# Patient Record
Sex: Female | Born: 1935 | Race: White | Hispanic: No | Smoking: Never smoker
Health system: Southern US, Community
[De-identification: ages and names within clinical notes are randomized; demographics above are authoritative.]

## PROBLEM LIST (undated history)

## (undated) DIAGNOSIS — I5189 Other ill-defined heart diseases: Secondary | ICD-10-CM

## (undated) DIAGNOSIS — I4821 Permanent atrial fibrillation: Secondary | ICD-10-CM

## (undated) DIAGNOSIS — I10 Essential (primary) hypertension: Secondary | ICD-10-CM

## (undated) DIAGNOSIS — I350 Nonrheumatic aortic (valve) stenosis: Secondary | ICD-10-CM

## (undated) DIAGNOSIS — R29898 Other symptoms and signs involving the musculoskeletal system: Secondary | ICD-10-CM

## (undated) DIAGNOSIS — I2510 Atherosclerotic heart disease of native coronary artery without angina pectoris: Secondary | ICD-10-CM

## (undated) DIAGNOSIS — E039 Hypothyroidism, unspecified: Secondary | ICD-10-CM

## (undated) DIAGNOSIS — E785 Hyperlipidemia, unspecified: Secondary | ICD-10-CM

## (undated) DIAGNOSIS — R0602 Shortness of breath: Secondary | ICD-10-CM

## (undated) HISTORY — PX: CATARACT EXTRACTION: SUR2

## (undated) HISTORY — DX: Essential (primary) hypertension: I10

## (undated) HISTORY — PX: OTHER SURGICAL HISTORY: SHX169

## (undated) HISTORY — DX: Hypothyroidism, unspecified: E03.9

---

## 1998-04-27 ENCOUNTER — Other Ambulatory Visit: Admission: RE | Admit: 1998-04-27 | Discharge: 1998-04-27 | Payer: Self-pay | Admitting: Obstetrics and Gynecology

## 1999-05-11 ENCOUNTER — Encounter: Payer: Self-pay | Admitting: Obstetrics and Gynecology

## 1999-05-11 ENCOUNTER — Encounter: Admission: RE | Admit: 1999-05-11 | Discharge: 1999-05-11 | Payer: Self-pay | Admitting: Obstetrics and Gynecology

## 1999-05-11 ENCOUNTER — Other Ambulatory Visit: Admission: RE | Admit: 1999-05-11 | Discharge: 1999-05-11 | Payer: Self-pay | Admitting: Obstetrics and Gynecology

## 1999-08-12 ENCOUNTER — Encounter: Admission: RE | Admit: 1999-08-12 | Discharge: 1999-08-12 | Payer: Self-pay | Admitting: *Deleted

## 2000-01-06 ENCOUNTER — Encounter: Admission: RE | Admit: 2000-01-06 | Discharge: 2000-01-06 | Payer: Self-pay | Admitting: *Deleted

## 2000-05-11 ENCOUNTER — Encounter: Admission: RE | Admit: 2000-05-11 | Discharge: 2000-05-11 | Payer: Self-pay | Admitting: *Deleted

## 2000-08-21 ENCOUNTER — Other Ambulatory Visit: Admission: RE | Admit: 2000-08-21 | Discharge: 2000-08-21 | Payer: Self-pay | Admitting: *Deleted

## 2001-05-16 ENCOUNTER — Encounter: Admission: RE | Admit: 2001-05-16 | Discharge: 2001-05-16 | Payer: Self-pay | Admitting: *Deleted

## 2001-08-29 ENCOUNTER — Encounter: Admission: RE | Admit: 2001-08-29 | Discharge: 2001-09-27 | Payer: Self-pay | Admitting: *Deleted

## 2002-01-22 ENCOUNTER — Ambulatory Visit (HOSPITAL_COMMUNITY): Admission: RE | Admit: 2002-01-22 | Discharge: 2002-01-22 | Payer: Self-pay | Admitting: Specialist

## 2002-03-17 ENCOUNTER — Ambulatory Visit (HOSPITAL_COMMUNITY): Admission: RE | Admit: 2002-03-17 | Discharge: 2002-03-17 | Payer: Self-pay | Admitting: Specialist

## 2002-05-05 ENCOUNTER — Encounter: Payer: Self-pay | Admitting: Internal Medicine

## 2002-05-05 ENCOUNTER — Encounter: Admission: RE | Admit: 2002-05-05 | Discharge: 2002-05-05 | Payer: Self-pay | Admitting: Internal Medicine

## 2003-05-11 ENCOUNTER — Encounter: Admission: RE | Admit: 2003-05-11 | Discharge: 2003-05-11 | Payer: Self-pay | Admitting: Internal Medicine

## 2004-02-01 ENCOUNTER — Encounter: Admission: RE | Admit: 2004-02-01 | Discharge: 2004-02-01 | Payer: Self-pay | Admitting: Internal Medicine

## 2004-05-11 ENCOUNTER — Encounter: Admission: RE | Admit: 2004-05-11 | Discharge: 2004-05-11 | Payer: Self-pay | Admitting: Internal Medicine

## 2005-05-11 ENCOUNTER — Encounter: Admission: RE | Admit: 2005-05-11 | Discharge: 2005-05-11 | Payer: Self-pay | Admitting: Internal Medicine

## 2006-05-17 ENCOUNTER — Encounter: Admission: RE | Admit: 2006-05-17 | Discharge: 2006-05-17 | Payer: Self-pay | Admitting: Internal Medicine

## 2007-03-12 ENCOUNTER — Encounter: Admission: RE | Admit: 2007-03-12 | Discharge: 2007-03-12 | Payer: Self-pay | Admitting: Internal Medicine

## 2007-05-09 ENCOUNTER — Ambulatory Visit (HOSPITAL_COMMUNITY): Admission: RE | Admit: 2007-05-09 | Discharge: 2007-05-09 | Payer: Self-pay | Admitting: General Surgery

## 2007-05-09 ENCOUNTER — Encounter (INDEPENDENT_AMBULATORY_CARE_PROVIDER_SITE_OTHER): Payer: Self-pay | Admitting: General Surgery

## 2007-08-08 ENCOUNTER — Encounter: Admission: RE | Admit: 2007-08-08 | Discharge: 2007-08-08 | Payer: Self-pay | Admitting: General Surgery

## 2007-09-19 ENCOUNTER — Encounter: Admission: RE | Admit: 2007-09-19 | Discharge: 2007-09-19 | Payer: Self-pay | Admitting: General Surgery

## 2007-09-25 ENCOUNTER — Encounter: Admission: RE | Admit: 2007-09-25 | Discharge: 2007-09-25 | Payer: Self-pay | Admitting: General Surgery

## 2007-09-27 ENCOUNTER — Encounter (INDEPENDENT_AMBULATORY_CARE_PROVIDER_SITE_OTHER): Payer: Self-pay | Admitting: Diagnostic Radiology

## 2007-09-27 ENCOUNTER — Encounter: Admission: RE | Admit: 2007-09-27 | Discharge: 2007-09-27 | Payer: Self-pay | Admitting: General Surgery

## 2008-03-16 ENCOUNTER — Encounter: Admission: RE | Admit: 2008-03-16 | Discharge: 2008-03-16 | Payer: Self-pay | Admitting: General Surgery

## 2010-01-04 ENCOUNTER — Ambulatory Visit: Payer: Self-pay | Admitting: Cardiology

## 2010-01-11 ENCOUNTER — Encounter: Payer: Self-pay | Admitting: Cardiology

## 2010-01-11 ENCOUNTER — Ambulatory Visit (HOSPITAL_COMMUNITY): Admission: RE | Admit: 2010-01-11 | Discharge: 2010-01-11 | Payer: Self-pay | Admitting: Cardiology

## 2010-03-30 ENCOUNTER — Emergency Department (HOSPITAL_COMMUNITY): Admission: EM | Admit: 2010-03-30 | Discharge: 2010-03-31 | Payer: Self-pay | Admitting: Emergency Medicine

## 2010-06-26 ENCOUNTER — Encounter: Payer: Self-pay | Admitting: Internal Medicine

## 2010-06-27 ENCOUNTER — Encounter: Payer: Self-pay | Admitting: Internal Medicine

## 2010-10-18 NOTE — Op Note (Signed)
Jacqueline [name], Jacqueline [name]                 ACCOUNT NO.:  [id number]   MEDICAL RECORD NO.:  [id number]          PATIENT TYPE:  AMB   LOCATION:  SDS                          FACILITY:  [facility]   PHYSICIAN:  Ollen Gross. Vernell Morgans, M.D. DATE OF BIRTH:  December 20, 1916   DATE OF PROCEDURE:  05/09/2007  DATE OF DISCHARGE:                               OPERATIVE REPORT   PREOPERATIVE DIAGNOSIS:  Left nipple ulceration.   POSTOPERATIVE DIAGNOSIS:  Left nipple ulceration.   PROCEDURE:  Left nipple biopsy.   SURGEON:  Ollen Gross. Vernell Morgans, M.D.   ANESTHESIA:  General via LMA.   PROCEDURE:  After informed consent was obtained, the patient was brought  to the operating and placed in supine position on the operating room  table.  After adequate induction of general anesthesia, the patient's  left breast was prepped with Betadine and draped in usual sterile  manner.  On the lateral aspect of the nipple the patient had two small  ulcerations.  The inferior ulceration was excised in elliptical fashion.  This was sent to pathology for further evaluation.  The wound was then  infiltrated with 0.25% Marcaine.  The incision was then closed with two  interrupted 5-0 Prolene stitches.  Antibiotic ointment, sterile  dressings were applied.  The patient tolerated well.  At the end of the  case all needle, sponge and instrument counts were correct.  The patient  was awakened and taken recovery in stable condition.      Ollen Gross. Vernell Morgans, M.D.  Electronically Signed     PST/MEDQ  D:  05/09/2007  T:  05/09/2007  Job:  [id number]

## 2011-01-30 ENCOUNTER — Other Ambulatory Visit: Payer: Self-pay | Admitting: *Deleted

## 2011-01-30 MED ORDER — OLMESARTAN MEDOXOMIL 40 MG PO TABS: 40.0000 mg | ORAL_TABLET | Freq: Every day | ORAL | Status: DC

## 2011-01-30 NOTE — Telephone Encounter (Signed)
escribe medication per fax request  

## 2011-03-13 LAB — BASIC METABOLIC PANEL
BUN: 18
CO2: 29
Calcium: 9.5
Chloride: 99
Creatinine, Ser: 0.75
GFR calc Af Amer: >60
GFR calc non Af Amer: >60
Glucose, Bld: 126 — ABNORMAL HIGH
Potassium: 3.8
Sodium: 139

## 2011-03-13 LAB — CBC
HCT: 40.1
Hemoglobin: 13.9
MCHC: 34.5
MCV: 88.8
Platelets: 196
RBC: 4.52
RDW: 15.1
WBC: 8.7

## 2011-12-29 ENCOUNTER — Emergency Department (HOSPITAL_COMMUNITY): Payer: Medicare Other

## 2011-12-29 ENCOUNTER — Emergency Department (HOSPITAL_COMMUNITY)
Admission: EM | Admit: 2011-12-29 | Discharge: 2011-12-30 | Disposition: A | Discharge status: Home / Self Care | Payer: Medicare Other | Attending: Emergency Medicine | Admitting: Emergency Medicine

## 2011-12-29 ENCOUNTER — Telehealth: Payer: Self-pay | Admitting: Cardiology

## 2011-12-29 ENCOUNTER — Encounter (HOSPITAL_COMMUNITY): Payer: Self-pay | Admitting: Emergency Medicine

## 2011-12-29 DIAGNOSIS — R7401 Elevation of levels of liver transaminase levels: Secondary | ICD-10-CM | POA: Insufficient documentation

## 2011-12-29 DIAGNOSIS — R7402 Elevation of levels of lactic acid dehydrogenase (LDH): Secondary | ICD-10-CM | POA: Insufficient documentation

## 2011-12-29 DIAGNOSIS — E039 Hypothyroidism, unspecified: Secondary | ICD-10-CM | POA: Insufficient documentation

## 2011-12-29 DIAGNOSIS — E119 Type 2 diabetes mellitus without complications: Secondary | ICD-10-CM | POA: Insufficient documentation

## 2011-12-29 DIAGNOSIS — I10 Essential (primary) hypertension: Secondary | ICD-10-CM | POA: Insufficient documentation

## 2011-12-29 DIAGNOSIS — R109 Unspecified abdominal pain: Secondary | ICD-10-CM | POA: Insufficient documentation

## 2011-12-29 DIAGNOSIS — D72829 Elevated white blood cell count, unspecified: Secondary | ICD-10-CM | POA: Insufficient documentation

## 2011-12-29 DIAGNOSIS — M109 Gout, unspecified: Secondary | ICD-10-CM | POA: Insufficient documentation

## 2011-12-29 LAB — CBC WITH DIFFERENTIAL/PLATELET
Eosinophils Absolute: 0 10*3/uL (ref 0.0–0.7)
HCT: 45.4 % (ref 36.0–46.0)
Hemoglobin: 15.2 g/dL — ABNORMAL HIGH (ref 12.0–15.0)
Lymphs Abs: 0.4 10*3/uL — ABNORMAL LOW (ref 0.7–4.0)
MCH: 29.5 pg (ref 26.0–34.0)
Monocytes Relative: 2 % — ABNORMAL LOW (ref 3–12)
Neutro Abs: 10.8 10*3/uL — ABNORMAL HIGH (ref 1.7–7.7)
Neutrophils Relative %: 94 % — ABNORMAL HIGH (ref 43–77)
RBC: 5.15 MIL/uL — ABNORMAL HIGH (ref 3.87–5.11)

## 2011-12-29 LAB — COMPREHENSIVE METABOLIC PANEL
Alkaline Phosphatase: 206 U/L — ABNORMAL HIGH (ref 39–117)
BUN: 18 mg/dL (ref 6–23)
Chloride: 102 mEq/L (ref 96–112)
GFR calc Af Amer: 85 mL/min — ABNORMAL LOW (ref >90)
Glucose, Bld: 114 mg/dL — ABNORMAL HIGH (ref 70–99)
Potassium: 3.4 mEq/L — ABNORMAL LOW (ref 3.5–5.1)
Total Bilirubin: 1.9 mg/dL — ABNORMAL HIGH (ref 0.3–1.2)

## 2011-12-29 MED ORDER — ONDANSETRON HCL 4 MG/2ML IJ SOLN
INTRAMUSCULAR | Status: AC
  Administered 2011-12-29: 4 mg
  Filled 2011-12-29: qty 2

## 2011-12-29 NOTE — ED Notes (Addendum)
Pt began having epigastric pain at 830 this morning that resolved after belching with L arm pain. AT 430p she began having diffuse lower abdominal pain. Tender to palpation. Has vomited approx. 6times now which is to be described as dark and grainy. CBG 105. Hypertensive initially. 230/118. Assisted living facility- [facility]. 4mg  Zofran. 20g R hand.

## 2011-12-29 NOTE — Telephone Encounter (Signed)
Patient called no answer.Left message to call needs to schedule appointment with Dr.Jordan. 

## 2011-12-29 NOTE — Telephone Encounter (Signed)
Pt had chest discomfort this morning and she lives in a [facility] and they told her to take an asa and she wants to talk to you about it

## 2011-12-29 NOTE — Telephone Encounter (Signed)
Spoke with pt, she woke this am with discomfort in her left shoulder and some tightness in her chest. She reports she has not felt well in several days but attributes that to several things. The pain in the left shoulder increases with movement. She took an aspirin and she no longer has the tightness in her chest. She reports her stomach was upset yesterday but felt better once she had a BM. She reports burping this am made the chest discomfort better. Explained to pt it does not sound like heart pain. She will call back if she has more problems but does not want to come in at this time.

## 2011-12-29 NOTE — ED Provider Notes (Addendum)
History     CSN: [id number]  Arrival date & time 12/29/11  2021   First MD Initiated Contact with Patient 12/29/11 2247      Chief Complaint  Patient presents with  . Emesis  . Abdominal Pain    (Consider location/radiation/quality/duration/timing/severity/associated sxs/prior treatment) Patient is a 78 y.o. female presenting with vomiting and abdominal pain. The history is provided by the patient and a relative.  Emesis  Associated symptoms include abdominal pain. Pertinent negatives include no cough, no diarrhea, no fever and no headaches.  Abdominal Pain The primary symptoms of the illness include abdominal pain, nausea and vomiting. The primary symptoms of the illness do not include fever, shortness of breath, diarrhea or dysuria.  pt had an episode of abdominal and chest "tightness" this am. Her sxs resolved after she burped. Later she had n/v.  Now she is asx. She denies cough, sob, diarrhea or uti sxs.  She denies pain or tightness now. She denies hx of ami or chf.  She denies hx of abd surgery in the past.  She denies smoking or etoh use.    Past Medical History  Diagnosis Date  . Hypertension   . Aortic stenosis   . Diabetes mellitus   . Dizziness   . Gout   . Hypothyroidism   . Undiagnosed cardiac murmurs     Past Surgical History  Procedure Date  . Left knee replacement   . Breast biopsies   . Cataract extraction     Family History  Problem Relation Age of Onset  . Alzheimer's disease Brother     History  Substance Use Topics  . Smoking status: Never Smoker   . Smokeless tobacco: Not on file  . Alcohol Use: No    OB History    Grav Para Term Preterm Abortions TAB SAB Ect Mult Living                  Review of Systems  Constitutional: Negative for fever.  HENT: Negative for congestion.   Eyes: Negative for visual disturbance.  Respiratory: Positive for chest tightness. Negative for cough and shortness of breath.   Gastrointestinal: Positive  for nausea, vomiting and abdominal pain. Negative for diarrhea.  Genitourinary: Negative for dysuria.  Skin: Negative for rash.  Neurological: Negative for headaches.  Hematological: Does not bruise/bleed easily.  Psychiatric/Behavioral: Negative for confusion.  All other systems reviewed and are negative.    Allergies  Review of patient's allergies indicates no known allergies.  Home Medications   Current Outpatient Rx  Name Route Sig Dispense Refill  . ATENOLOL 50 MG PO TABS Oral Take 50 mg by mouth at bedtime.     . FUROSEMIDE 40 MG PO TABS Oral Take 40 mg by mouth daily.    Marland Kitchen LEVOTHYROXINE SODIUM 125 MCG PO TABS Oral Take 125 mcg by mouth daily.    Marland Kitchen OLMESARTAN MEDOXOMIL 40 MG PO TABS Oral Take 1 tablet (40 mg total) by mouth daily. 30 tablet 0    This patient has not been since in over a year. Wi ...  . POTASSIUM CHLORIDE CRYS ER 20 MEQ PO TBCR Oral Take 20 mEq by mouth daily.      BP 173/62  Pulse 93  Temp 99.8 F (37.7 C) (Oral)  Resp 22  SpO2 91%  Physical Exam  Nursing note and vitals reviewed. Constitutional: She is oriented to person, place, and time. She appears well-developed and well-nourished. No distress.  HENT:  Head: Normocephalic and  atraumatic.  Eyes: Conjunctivae are normal.  Neck: Normal range of motion. Neck supple. No JVD present.       No carotid bruit  Cardiovascular: Normal rate.   Murmur heard. Pulmonary/Chest: Effort normal. No respiratory distress. She has no rales.  Abdominal: Soft. She exhibits no distension. There is tenderness. There is no rebound and no guarding.       Mild epigastric tenderness, with no peritoneal signs  Musculoskeletal: Normal range of motion.  Neurological: She is alert and oriented to person, place, and time.  Skin: Skin is warm and dry.  Psychiatric: She has a normal mood and affect. Thought content normal.    ED Course  Procedures (including critical care time) abdominal and chest tightness in the morning,  which resolved after burping.  She is asymptomatic now with only very mild epigastric tenderness.  We'll perform laboratory testing, and a chest x-ray, since she is mildly hypoxic.  There is no indication for medical treatment at this time.  Since she is asymptomatic  Labs Reviewed  CBC WITH DIFFERENTIAL - Abnormal; Notable for the following:    WBC 11.5 (*)     RBC 5.15 (*)     Hemoglobin 15.2 (*)     Platelets 143 (*)     Neutrophils Relative 94 (*)     Neutro Abs 10.8 (*)     Lymphocytes Relative 3 (*)     Lymphs Abs 0.4 (*)     Monocytes Relative 2 (*)     All other components within normal limits  COMPREHENSIVE METABOLIC PANEL - Abnormal; Notable for the following:    Potassium 3.4 (*)     Glucose, Bld 114 (*)     AST 155 (*)     ALT 81 (*)     Alkaline Phosphatase 206 (*)     Total Bilirubin 1.9 (*)     GFR calc non Af Amer 73 (*)     GFR calc Af Amer 85 (*)     All other components within normal limits  POCT I-STAT TROPONIN I  URINALYSIS, ROUTINE W REFLEX MICROSCOPIC   No results found.   No diagnosis found.  Abdominal pain that occurred in the am. Resolved.   Only mild epigastric ttp. No acute abdomen.   Has increased transaminases  But afebrile. Only mild incr. Wbcs and no dm.  Do not think [place] indicated now in ed.   ECG. Normal sinus rhythm at 95 beats per minute. First degree AV block. LVH. Q waves in inferior leads  MDM  Abdominal pain. Leukocytosis. Increased transaminases.         Cheri Guppy, MD 12/30/11 1610  Cheri Guppy, MD 12/30/11 [id number]  Cheri Guppy, MD 01/23/12 [phone]

## 2011-12-29 NOTE — ED Notes (Signed)
WJX:BJYN<WG> Expected date:<BR> Expected time:<BR> Means of arrival:<BR> Comments:<BR> EMS- 89yof nausea, chest pain this am

## 2011-12-29 NOTE — ED Notes (Addendum)
Chelesea Weiand (son)  Home) [phone] Cell) [phone]

## 2011-12-30 LAB — URINALYSIS, ROUTINE W REFLEX MICROSCOPIC
Ketones, ur: NEGATIVE mg/dL
Leukocytes, UA: NEGATIVE
Nitrite: NEGATIVE
Protein, ur: NEGATIVE mg/dL

## 2011-12-30 MED ORDER — METOCLOPRAMIDE HCL 10 MG PO TABS: 10.0000 mg | ORAL_TABLET | Freq: Four times a day (QID) | ORAL | Status: DC

## 2012-01-02 NOTE — Telephone Encounter (Signed)
Patient called stated she had to go to ER 12/29/11,was told she had a stomach virus.Appointment scheduled with Dr.Jordan 01/30/12.

## 2012-01-30 ENCOUNTER — Ambulatory Visit: Payer: Medicare Other | Admitting: Cardiology

## 2012-03-06 ENCOUNTER — Ambulatory Visit (INDEPENDENT_AMBULATORY_CARE_PROVIDER_SITE_OTHER): Payer: Medicare Other | Admitting: Cardiology

## 2012-03-06 ENCOUNTER — Encounter: Payer: Self-pay | Admitting: Cardiology

## 2012-03-06 VITALS — BP 168/86 | HR 72 | Ht 64.0 in | Wt 189.8 lb

## 2012-03-06 DIAGNOSIS — I10 Essential (primary) hypertension: Secondary | ICD-10-CM | POA: Insufficient documentation

## 2012-03-06 DIAGNOSIS — I359 Nonrheumatic aortic valve disorder, unspecified: Secondary | ICD-10-CM

## 2012-03-06 DIAGNOSIS — I350 Nonrheumatic aortic (valve) stenosis: Secondary | ICD-10-CM

## 2012-03-06 DIAGNOSIS — R0789 Other chest pain: Secondary | ICD-10-CM

## 2012-03-06 NOTE — Progress Notes (Signed)
Jacqueline [name] Date of Birth: July 22, 1916 Medical Record #[id number]  History of Present Illness: Jacqueline [name] is seen today for evaluation of chest pain. She was last seen 2 years ago. She has a history of a murmur with prior echocardiogram showed mild aortic stenosis. She has a history of hypertension. In July of this year she states that she awoke with complaints of chest pain. She states it felt very tight in her chest and radiated to her left arm. She then began belching and had some emesis. She presented to the emergency department. ECG at that time showed LVH with repolarization abnormality. Cardiac enzymes were negative. Her liver function studies were elevated. She was discharged to home. She has had no recurrent chest pain since that time. She reports that she has had followup lab work since then but does not know the results.  Current Outpatient Prescriptions on File Prior to Visit  Medication Sig Dispense Refill  . atenolol (TENORMIN) 50 MG tablet Take 50 mg by mouth at bedtime.       . furosemide (LASIX) 40 MG tablet Take 40 mg by mouth daily.      Marland Kitchen levothyroxine (SYNTHROID, LEVOTHROID) 125 MCG tablet Take 125 mcg by mouth daily.      . potassium chloride SA (K-DUR,KLOR-CON) 20 MEQ tablet Take 20 mEq by mouth daily.      Marland Kitchen olmesartan (BENICAR) 40 MG tablet Take 1 tablet (40 mg total) by mouth daily.  30 tablet  0    No Known Allergies  Past Medical History  Diagnosis Date  . Hypertension   . Aortic stenosis     mild  . Diabetes mellitus   . Dizziness   . Gout   . Hypothyroidism     Past Surgical History  Procedure Date  . Left knee replacement   . Breast biopsies   . Cataract extraction     History  Smoking status  . Never Smoker   Smokeless tobacco  . Not on file    History  Alcohol Use No    Family History  Problem Relation Age of Onset  . Alzheimer's disease Brother     Review of Systems: The review of systems is positive for chronic edema for which  she wears a support hose. She suffered a fall 2 years ago and has been living at [facility]. She does get regular physical therapy there.  All other systems were reviewed and are negative.  Physical Exam: BP 168/86  Pulse 72  Ht 5\' 4"  (1.626 m)  Wt 189 lb 12.8 oz (86.093 kg)  BMI 32.58 kg/m2  SpO2 95% She is an obese, elderly white female in no acute distress. Her HEENT exam is unremarkable. Pupils are equal round and reactive. Sclera clear. Oropharynx is clear. Neck is without JVD or bruits. There is no adenopathy. Lungs are clear. Cardiac exam reveals a regular rate and rhythm with a grade 2/6 harsh systolic murmur in the right upper sternal border. Abdomen is obese, soft, nontender. There are no masses. Extremities reveal chronic 2+ edema. She is wearing support hose. There is no evidence of phlebitis. Skin is warm and dry. Neurologic exam she is alert and oriented x3. She does walk with a cane. LABORATORY DATA: Laboratory data was reviewed from the emergency department as noted.  Assessment / Plan: 1. Atypical chest pain. This occurred in July of this year. She has had no recurrence. I do not feel this was a cardiac event. Given her elevated  liver function studies I suspect she may have passed gallstone. I do not feel that any further evaluation is warranted. She will followup with her primary care at this point and I will see her as needed.  2. Hypertension.  3. Mild aortic stenosis.

## 2012-03-06 NOTE — Patient Instructions (Signed)
Continue your current therapy  I will see you back as needed.   

## 2012-07-30 ENCOUNTER — Encounter (HOSPITAL_COMMUNITY): Payer: Self-pay | Admitting: Emergency Medicine

## 2012-07-30 ENCOUNTER — Inpatient Hospital Stay (HOSPITAL_COMMUNITY)
Admission: EM | Admit: 2012-07-30 | Discharge: 2012-08-06 | DRG: 247 | Disposition: A | Discharge status: Nursing Home (Includes ICF) | Payer: Medicare Other | Attending: Cardiology | Admitting: Cardiology

## 2012-07-30 DIAGNOSIS — I2510 Atherosclerotic heart disease of native coronary artery without angina pectoris: Principal | ICD-10-CM | POA: Diagnosis present

## 2012-07-30 DIAGNOSIS — R5381 Other malaise: Secondary | ICD-10-CM | POA: Diagnosis present

## 2012-07-30 DIAGNOSIS — E039 Hypothyroidism, unspecified: Secondary | ICD-10-CM | POA: Diagnosis present

## 2012-07-30 DIAGNOSIS — Z96659 Presence of unspecified artificial knee joint: Secondary | ICD-10-CM

## 2012-07-30 DIAGNOSIS — I10 Essential (primary) hypertension: Secondary | ICD-10-CM | POA: Diagnosis present

## 2012-07-30 DIAGNOSIS — M109 Gout, unspecified: Secondary | ICD-10-CM | POA: Diagnosis present

## 2012-07-30 DIAGNOSIS — Z7982 Long term (current) use of aspirin: Secondary | ICD-10-CM

## 2012-07-30 DIAGNOSIS — E119 Type 2 diabetes mellitus without complications: Secondary | ICD-10-CM | POA: Diagnosis present

## 2012-07-30 DIAGNOSIS — I200 Unstable angina: Secondary | ICD-10-CM | POA: Diagnosis present

## 2012-07-30 DIAGNOSIS — E785 Hyperlipidemia, unspecified: Secondary | ICD-10-CM | POA: Diagnosis present

## 2012-07-30 DIAGNOSIS — I359 Nonrheumatic aortic valve disorder, unspecified: Secondary | ICD-10-CM | POA: Diagnosis present

## 2012-07-30 DIAGNOSIS — I350 Nonrheumatic aortic (valve) stenosis: Secondary | ICD-10-CM

## 2012-07-30 HISTORY — DX: Atherosclerotic heart disease of native coronary artery without angina pectoris: I25.10

## 2012-07-30 HISTORY — DX: Nonrheumatic aortic (valve) stenosis: I35.0

## 2012-07-30 NOTE — ED Notes (Signed)
Pt brought in by EMS from [facility] for complaints of Chest pain. Pt states she felt a constant throbbing sensation on the left side of her chest. Pt denies SOB/ N/V. Pt denies pain at this time. EMS states b/p 218/108. Pt alert and oriented. No signs of distress. Pt states she EMS gave her 1 ASA and 2 Nitro tabs. Pt arrived on 3L [place] o2

## 2012-07-30 NOTE — ED Notes (Signed)
MD at bedside. 

## 2012-07-31 ENCOUNTER — Encounter (HOSPITAL_COMMUNITY): Payer: Self-pay | Admitting: Cardiology

## 2012-07-31 ENCOUNTER — Emergency Department (HOSPITAL_COMMUNITY): Payer: Medicare Other

## 2012-07-31 DIAGNOSIS — R079 Chest pain, unspecified: Secondary | ICD-10-CM

## 2012-07-31 LAB — HEMOGLOBIN A1C: Mean Plasma Glucose: 123 mg/dL — ABNORMAL HIGH (ref <117)

## 2012-07-31 LAB — CBC
Hemoglobin: 14.2 g/dL (ref 12.0–15.0)
MCH: 30.1 pg (ref 26.0–34.0)
Platelets: 144 10*3/uL — ABNORMAL LOW (ref 150–400)
RBC: 4.72 MIL/uL (ref 3.87–5.11)
WBC: 6.3 10*3/uL (ref 4.0–10.5)

## 2012-07-31 LAB — BASIC METABOLIC PANEL
BUN: 21 mg/dL (ref 6–23)
CO2: 24 mEq/L (ref 19–32)
Calcium: 9.1 mg/dL (ref 8.4–10.5)
GFR calc Af Amer: 90 mL/min — ABNORMAL LOW (ref >90)
GFR calc non Af Amer: 76 mL/min — ABNORMAL LOW (ref >90)
GFR calc non Af Amer: 77 mL/min — ABNORMAL LOW (ref >90)
Glucose, Bld: 111 mg/dL — ABNORMAL HIGH (ref 70–99)
Glucose, Bld: 99 mg/dL (ref 70–99)
Potassium: 3.1 mEq/L — ABNORMAL LOW (ref 3.5–5.1)
Potassium: 3.3 mEq/L — ABNORMAL LOW (ref 3.5–5.1)
Sodium: 141 mEq/L (ref 135–145)

## 2012-07-31 LAB — HEPARIN LEVEL (UNFRACTIONATED): Heparin Unfractionated: 0.18 IU/mL — ABNORMAL LOW (ref 0.30–0.70)

## 2012-07-31 LAB — PRO B NATRIURETIC PEPTIDE: Pro B Natriuretic peptide (BNP): 1371 pg/mL — ABNORMAL HIGH (ref 0–450)

## 2012-07-31 LAB — CBC WITH DIFFERENTIAL/PLATELET
Eosinophils Absolute: 0.1 10*3/uL (ref 0.0–0.7)
Eosinophils Relative: 2 % (ref 0–5)
Hemoglobin: 14.1 g/dL (ref 12.0–15.0)
Lymphs Abs: 1.2 10*3/uL (ref 0.7–4.0)
MCH: 30.1 pg (ref 26.0–34.0)
MCV: 88.5 fL (ref 78.0–100.0)
Monocytes Relative: 7 % (ref 3–12)
Neutrophils Relative %: 77 % (ref 43–77)
RBC: 4.69 MIL/uL (ref 3.87–5.11)

## 2012-07-31 MED ORDER — HEPARIN BOLUS VIA INFUSION
2000.0000 [IU] | Freq: Once | INTRAVENOUS | Status: AC
  Administered 2012-07-31: 2000 [IU] via INTRAVENOUS
  Filled 2012-07-31: qty 2000

## 2012-07-31 MED ORDER — IRBESARTAN 300 MG PO TABS
300.0000 mg | ORAL_TABLET | Freq: Every day | ORAL | Status: DC
  Administered 2012-07-31 – 2012-08-06 (×7): 300 mg via ORAL
  Filled 2012-07-31 (×7): qty 1

## 2012-07-31 MED ORDER — SODIUM CHLORIDE 0.9 % IJ SOLN
3.0000 mL | Freq: Two times a day (BID) | INTRAMUSCULAR | Status: DC
  Administered 2012-07-31 – 2012-08-02 (×2): 3 mL via INTRAVENOUS

## 2012-07-31 MED ORDER — MORPHINE SULFATE 2 MG/ML IJ SOLN
2.0000 mg | INTRAMUSCULAR | Status: DC | PRN
  Administered 2012-08-02: 2 mg via INTRAVENOUS
  Filled 2012-07-31: qty 1

## 2012-07-31 MED ORDER — AMLODIPINE BESYLATE 5 MG PO TABS
5.0000 mg | ORAL_TABLET | Freq: Every day | ORAL | Status: DC
  Administered 2012-07-31: 5 mg via ORAL
  Filled 2012-07-31 (×2): qty 1

## 2012-07-31 MED ORDER — ATORVASTATIN CALCIUM 20 MG PO TABS
20.0000 mg | ORAL_TABLET | Freq: Every day | ORAL | Status: DC
  Administered 2012-07-31 – 2012-08-05 (×6): 20 mg via ORAL
  Filled 2012-07-31 (×7): qty 1

## 2012-07-31 MED ORDER — SODIUM CHLORIDE 0.9 % IV SOLN: 250.0000 mL | INTRAVENOUS | Status: DC | PRN

## 2012-07-31 MED ORDER — ONDANSETRON HCL 4 MG PO TABS: 4.0000 mg | ORAL_TABLET | Freq: Four times a day (QID) | ORAL | Status: DC | PRN

## 2012-07-31 MED ORDER — NITROGLYCERIN 0.4 MG SL SUBL
0.4000 mg | SUBLINGUAL_TABLET | SUBLINGUAL | Status: DC | PRN
Start: 2012-07-31 — End: 2012-08-06

## 2012-07-31 MED ORDER — SODIUM CHLORIDE 0.9 % IJ SOLN
3.0000 mL | Freq: Two times a day (BID) | INTRAMUSCULAR | Status: DC
  Administered 2012-07-31 – 2012-08-01 (×3): 3 mL via INTRAVENOUS

## 2012-07-31 MED ORDER — ASPIRIN 325 MG PO TABS
325.0000 mg | ORAL_TABLET | Freq: Every day | ORAL | Status: DC
  Administered 2012-07-31 – 2012-08-05 (×6): 325 mg via ORAL
  Filled 2012-07-31 (×8): qty 1

## 2012-07-31 MED ORDER — HEPARIN (PORCINE) IN NACL 100-0.45 UNIT/ML-% IJ SOLN
1000.0000 [IU]/h | INTRAMUSCULAR | Status: DC
  Administered 2012-07-31: 750 [IU]/h via INTRAVENOUS
  Filled 2012-07-31: qty 250

## 2012-07-31 MED ORDER — LEVOTHYROXINE SODIUM 125 MCG PO TABS
125.0000 ug | ORAL_TABLET | Freq: Every day | ORAL | Status: DC
  Administered 2012-07-31 – 2012-08-06 (×7): 125 ug via ORAL
  Filled 2012-07-31 (×7): qty 1

## 2012-07-31 MED ORDER — FUROSEMIDE 40 MG PO TABS
40.0000 mg | ORAL_TABLET | Freq: Every day | ORAL | Status: DC | PRN
  Filled 2012-07-31: qty 1

## 2012-07-31 MED ORDER — ATENOLOL 50 MG PO TABS
50.0000 mg | ORAL_TABLET | Freq: Every day | ORAL | Status: DC
  Administered 2012-07-31: 50 mg via ORAL
  Filled 2012-07-31 (×2): qty 1

## 2012-07-31 MED ORDER — ONDANSETRON HCL 4 MG/2ML IJ SOLN: 4.0000 mg | Freq: Four times a day (QID) | INTRAMUSCULAR | Status: DC | PRN

## 2012-07-31 MED ORDER — SODIUM CHLORIDE 0.9 % IJ SOLN: 3.0000 mL | INTRAMUSCULAR | Status: DC | PRN

## 2012-07-31 MED ORDER — POTASSIUM CHLORIDE CRYS ER 20 MEQ PO TBCR
20.0000 meq | EXTENDED_RELEASE_TABLET | Freq: Every day | ORAL | Status: DC
  Administered 2012-07-31 – 2012-08-05 (×6): 20 meq via ORAL
  Filled 2012-07-31 (×7): qty 1

## 2012-07-31 NOTE — Progress Notes (Signed)
UR Completed Jasamine Pottinger Graves-Bigelow, RN,BSN [phone]  

## 2012-07-31 NOTE — Consult Note (Signed)
CARDIOLOGY CONSULT NOTE  Patient ID: Jacqueline [name] MRN: [id number] DOB/AGE: 1917-04-15 79 y.o.  Admit date: 07/30/2012 Primary Physician   Warrick Parisian, MD Primary Cardiologist  Dr. Creta Levin, MD Chief Complaint   Chest pain  HPI:  The patient presents with chest pain.  She lives at [facility].  She developed chest pain on 2/25 and called EMS.  Per the ER notes her BP was elevated (218/108).  She was treated with NGT SL and ASA.  She has a history of this chest pain in the past thought to be non anginal.  She sees Dr. [place] for follow up of this and mild AS.  Troponin has been negative x 3.  EKG shows no acute changes.  Pro BNP is mildly elevated (1371).    The patient reports that she's had some chest discomfort and predominantly left arm discomfort that she gets when she is anxious or exerting she thinks this is coming on with less exertion than previous. Discomfort is about 4/10.   she does not describe associated symptoms. She presented to the emergency room because the pain happened at rest. She did think that it improves after being given nitroglycerin and aspirin. Typically he goes away when she combs down. She does also note that her blood pressure goes up when she is emotionally stressed. She gets around with a walker, cane and a scooter. She's had no new shortness of breath, PND or orthopnea. She's had no palpitations, presyncope or syncope. She's had no weight gain she does have some chronic edema.   Past Medical History  Diagnosis Date  . Hypertension   . Aortic stenosis     mild  . Diabetes mellitus   . Dizziness   . Gout   . Hypothyroidism     Past Surgical History  Procedure Laterality Date  . Left knee replacement    . Breast biopsies    . Cataract extraction      No Known Allergies Prescriptions prior to admission  Medication Sig Dispense Refill  . aspirin 325 MG tablet Take 325 mg by mouth daily.      Marland Kitchen atenolol (TENORMIN) 50 MG tablet Take 50 mg by  mouth at bedtime.       . furosemide (LASIX) 40 MG tablet Take 40 mg by mouth daily as needed (for swelling).       Marland Kitchen levothyroxine (SYNTHROID, LEVOTHROID) 125 MCG tablet Take 125 mcg by mouth daily.      Marland Kitchen olmesartan (BENICAR) 40 MG tablet Take 40 mg by mouth daily.      . potassium chloride SA (K-DUR,KLOR-CON) 20 MEQ tablet Take 20 mEq by mouth daily.      Marland Kitchen olmesartan (BENICAR) 40 MG tablet Take 1 tablet (40 mg total) by mouth daily.  30 tablet  0   Family History  Problem Relation Age of Onset  . Alzheimer's disease Brother     History   Social History  . Marital Status: Widowed    Spouse Name: N/A    Number of Children: 2  . Years of Education: N/A   Occupational History  . Not on file.   Social History Main Topics  . Smoking status: Never Smoker   . Smokeless tobacco: Not on file  . Alcohol Use: No  . Drug Use: No  . Sexually Active:    Other Topics Concern  . Not on file   Social History Narrative  . No narrative on file     ROS:  Positive  for constipation. Otherwise as stated in the HPI and negative for all other systems.  Physical Exam: Blood pressure 175/97, pulse 73, temperature 97.7 F (36.5 C), temperature source Axillary, resp. rate 20, height 5\' 4"  (1.626 m), weight 191 lb (86.637 kg), SpO2 97.00%.  GENERAL:  Well appearing, looks much younger than stated age HEENT:  Pupils equal round and reactive, fundi not visualized, oral mucosa unremarkable NECK:  No jugular venous distention, waveform within normal limits, carotid upstroke brisk and symmetric, no bruits, no thyromegaly LYMPHATICS:  No cervical, inguinal adenopathy LUNGS:  Clear to auscultation bilaterally BACK:  No CVA tenderness CHEST:  Unremarkable HEART:  PMI not displaced or sustained,S1 and S2 within normal limits, no S3, no S4, no clicks, no rubs, apical early peaking systolic murmur, no diastolic murmurs ABD:  Flat, positive bowel sounds normal in frequency in pitch, no bruits, no rebound,  no guarding, no midline pulsatile mass, no hepatomegaly, no splenomegaly EXT:  2 plus pulses throughout, mild bilateral lower extremity edema, no cyanosis no clubbing SKIN:  No rashes no nodules NEURO:  Cranial nerves II through XII grossly intact, motor grossly intact throughout PSYCH:  Cognitively intact, oriented to person place and time  Labs: Lab Results  Component Value Date   BUN 17 07/31/2012   Lab Results  Component Value Date   CREATININE 0.57 07/31/2012   Lab Results  Component Value Date   NA 141 07/31/2012   K 3.1* 07/31/2012   CL 108 07/31/2012   CO2 24 07/31/2012   Lab Results  Component Value Date   TROPONINI <0.30 07/31/2012   Lab Results  Component Value Date   WBC 6.3 07/31/2012   HGB 14.2 07/31/2012   HCT 41.5 07/31/2012   MCV 87.9 07/31/2012   PLT 144* 07/31/2012     Radiology:   CXR:  Emphysematous and chronic bronchitic changes in the lungs with  scattered fibrosis. Fluid or thickened pleura in the right  costophrenic angle.  EKG:  NSR with frequent PACs, LVH by voltage.  No acute ST T wave changes.    ASSESSMENT AND PLAN:   CHEST PAIN: Her chest pain does sound like it could be angina exacerbated by her hypertension. We discussed the possibility of cardiac cath. However, she would like to pursue conservative management for with better blood pressure control.  HTN: Her blood pressure remains persistently elevated. At the risk of exacerbating some chronic edema I am going to start amlodipine 5 mg daily.  AS: This has been mild in the past and I do not suspect this is contributing to current symptoms. Outpatient echocardiography would be reasonable however.  SignedRollene Rotunda 07/31/2012, 11:23 AM

## 2012-07-31 NOTE — Progress Notes (Signed)
ANTICOAGULATION CONSULT NOTE - Initial Consult  Pharmacy Consult for Heparin Indication: Chest pain/ACS  No Known Allergies  Patient Measurements: Height: 5\' 4"  (162.6 cm) Weight: 185 lb (83.915 kg) IBW/kg (Calculated) : 54.7 Heparin Dosing Weight: 70 kg   Vital Signs: Temp: 97.9 F (36.6 C) (02/25 2346) Temp src: Oral (02/25 2346) BP: 184/79 mmHg (02/26 0257) Pulse Rate: 65 (02/26 0257)  Labs:  Recent Labs  07/31/12 0007  HGB 14.1  HCT 41.5  PLT 144*  CREATININE 0.60  TROPONINI <0.30    Estimated Creatinine Clearance: 46.1 ml/min (by C-G formula based on Cr of 0.6).   Medical History: Past Medical History  Diagnosis Date  . Hypertension   . Aortic stenosis     mild  . Diabetes mellitus   . Dizziness   . Gout   . Hypothyroidism     Medications:  Prescriptions prior to admission  Medication Sig Dispense Refill  . aspirin 325 MG tablet Take 325 mg by mouth daily.      Marland Kitchen atenolol (TENORMIN) 50 MG tablet Take 50 mg by mouth at bedtime.       . furosemide (LASIX) 40 MG tablet Take 40 mg by mouth daily as needed (for swelling).       Marland Kitchen levothyroxine (SYNTHROID, LEVOTHROID) 125 MCG tablet Take 125 mcg by mouth daily.      Marland Kitchen olmesartan (BENICAR) 40 MG tablet Take 40 mg by mouth daily.      . potassium chloride SA (K-DUR,KLOR-CON) 20 MEQ tablet Take 20 mEq by mouth daily.      Marland Kitchen olmesartan (BENICAR) 40 MG tablet Take 1 tablet (40 mg total) by mouth daily.  30 tablet  0    Assessment: 79 yo female with chest pain for heparin  Goal of Therapy:  Heparin level 0.3-0.7 units/ml Monitor platelets by anticoagulation protocol: Yes   Plan:  Heparin 2000 units IV bolus, then 750 units/hr Check heparin level in 8 hours.  Eddie Candle 07/31/2012,4:30 AM

## 2012-07-31 NOTE — H&P (Signed)
[facility]  Date: 07/31/2012               Patient Name:  Jacqueline [name] MRN: [id number]  DOB: November 20, 1916 Age / Sex: 79 y.o., female   PCP: STALLINGS,SHEILA              Chief Complaint: Chest pain  History of Present Illness: She is a 79 year old female with past medical history most significant for hypertension, mild aortic stenosis, diabetes, hypothyroidism and gout who lives in independent living. Has had diffuse, exertional chest tightness for the past 7 days which usually goes away with rest. But today after supper when patient was doing some errands started having chest pain which was persistent even after rest. Received aspirin and ntg en route and pain resolved. Associated w/ "fogginess" but no SOB, diaphoresis or nausea. Denies fever and cough. Pt was evaluated for similar CP in ED in 12/2011 and discharged home. She followed up with [facility] in 03/2012 and it was thought that pain may be have been secondary to cholelithiasis. Patient's cardiologist is Dr. Swaziland. Above cardiology was called and they said that patient should be admitted to internal medicine service.    Current Outpatient Medications:  (Not in a hospital admission)  Allergies: No Known Allergies   Past Medical History: Past Medical History  Diagnosis Date  . Hypertension   . Aortic stenosis     mild  . Diabetes mellitus   . Dizziness   . Gout   . Hypothyroidism     Past Surgical History: Past Surgical History  Procedure Laterality Date  . Left knee replacement    . Breast biopsies    . Cataract extraction      Family History: Family History  Problem Relation Age of Onset  . Alzheimer's disease Brother     Social History: History   Social History  . Marital Status: Widowed    Spouse Name: N/A    Number of Children: 2  . Years of Education: N/A   Occupational History  . Not on file.   Social History Main Topics  . Smoking status: Never Smoker   . Smokeless tobacco: Not  on file  . Alcohol Use: No  . Drug Use: No  . Sexually Active:    Other Topics Concern  . Not on file   Social History Narrative  . No narrative on file    Review of Systems: Constitutional:  denies fever, chills, diaphoresis, appetite change and fatigue.  HEENT: denies photophobia, eye pain, redness, hearing loss, ear pain, congestion, sore throat, rhinorrhea, sneezing, neck pain, neck stiffness and tinnitus.  Respiratory: denies SOB, DOE, cough, chest tightness, and wheezing.  Cardiovascular: admits to chest pain, palpitations and leg swelling.  Gastrointestinal: denies nausea, vomiting, abdominal pain, diarrhea, constipation, blood in stool.  Genitourinary: denies dysuria, urgency, frequency, hematuria, flank pain and difficulty urinating.  Musculoskeletal: denies  myalgias, back pain, joint swelling, arthralgias and gait problem.   Skin: denies pallor, rash and wound.  Neurological: denies dizziness, seizures, syncope, weakness, light-headedness, numbness and headaches.   Hematological: denies adenopathy, easy bruising, personal or family bleeding history.  Psychiatric/ Behavioral: denies suicidal ideation, mood changes, confusion, nervousness, sleep disturbance and agitation.    Vital Signs: Blood pressure 166/68, pulse 65, temperature 97.9 F (36.6 C), temperature source Oral, resp. rate 17, height 5\' 4"  (1.626 m), weight 185 lb (83.915 kg), SpO2 98.00%.  Physical Exam: General: Vital signs reviewed and noted. Well-developed, well-nourished, in no acute distress; alert, appropriate  and cooperative throughout examination.  Head: Normocephalic, atraumatic.  Eyes: PERRL, EOMI, No signs of anemia or jaundince.  Nose: Mucous membranes moist, not inflammed, nonerythematous.  Throat: Oropharynx nonerythematous, no exudate appreciated.   Neck: No deformities, masses, or tenderness noted.Supple, No carotid Bruits, no JVD.  Lungs:  Normal respiratory effort. Clear to auscultation BL  without crackles or wheezes.  Heart: RRR. S1 and S2 normal without gallop, murmur, or rubs.  Abdomen:  BS normoactive. Soft, Nondistended, non-tender.  No masses or organomegaly.  Extremities:  2+ pitting edema bilaterally   Neurologic: A&O X3, CN II - XII are grossly intact. Motor strength is 5/5 in the all 4 extremities, Sensations intact to light touch, Cerebellar signs negative.  Skin:  patient has friable skin with multiple rashes or bruises    Lab results: Basic Metabolic Panel:  Recent Labs  69/62/95 0007  NA 138  K 3.3*  CL 104  CO2 24  GLUCOSE 111*  BUN 21  CREATININE 0.60  CALCIUM 9.3   CBC:  Recent Labs  07/31/12 0007  WBC 8.3  NEUTROABS 6.4  HGB 14.1  HCT 41.5  MCV 88.5  PLT 144*   Cardiac Enzymes:  Recent Labs  07/31/12 0007  TROPONINI <0.30    Imaging results:  Dg Chest 2 View  07/31/2012  *RADIOLOGY REPORT*  Clinical Data: Chest pain.  CHEST - 2 VIEW  Comparison: 12/29/2011  Findings: Emphysematous changes in the lungs.  Scattered fibrosis. Blunting of the right costophrenic angle suggesting fluid or thickened pleura.  No pneumothorax.  Central bronchial wall thickening suggesting chronic bronchitis.  No focal consolidation. Normal heart size and pulmonary vascularity.  Calcification of the aorta.  Degenerative changes in the spine and shoulders.  IMPRESSION: Emphysematous and chronic bronchitic changes in the lungs with scattered fibrosis.  Fluid or thickened pleura in the right costophrenic angle.   Original Report Authenticated By: Burman Nieves, M.D.       Other results:  EKG (07/31/2012) no EKG in the chart for independent review but apparently normal as per the ER physician   Assessment & Plan: Pt is a 79 y.o. yo female with a PMHX of hypertension, hyperlipidemia and diabetes, who presented to [facility] on 07/30[facility]2012 with symptoms of mid chest chest pain, with EKG showing no changes and initial troponins negative.    Unstable anigna - Patient  does not have known history of coronary artery disease. She has cardiac risk factors including the following: Age, diabetic  hypertension  hyperlipidemia. Admission EKG showed no changes, and cardiac enzymes have been negative but the story and given the risk factors, the symptoms seems consistent with unstable angina. Therefore, management at this time will be focused on anticoagulation, observation and pain relief.   Admit to telemetry  Continue to cycle cardiac enzymes.  Continue aspirin, beta blocker, statin therapy.  Continue morphine, oxygen, PRN nitroglycerin, heparin gtt.  Check HgA1c for risk factor stratification.   Cardiology to see the patient in AM. Dr. Mayford Knife on call informed about the patient.     DVT PPX - heparin drip  CODE STATUS - Full  CONSULTS PLACED - None  DISPO - Disposition is deferred at this time, awaiting improvement of Chest pain.    Signed: Lars Mage, MD  07/31/2012, 2:38 AM

## 2012-07-31 NOTE — ED Notes (Signed)
MD at bedside. consult 

## 2012-07-31 NOTE — ED Notes (Signed)
Patient transported to X-ray 

## 2012-07-31 NOTE — ED Provider Notes (Signed)
History     CSN: [id number]  Arrival date & time 07/30/12  2333   First MD Initiated Contact with Patient 07/30/12 2338      Chief Complaint  Patient presents with  . Chest Pain    (Consider location/radiation/quality/duration/timing/severity/associated sxs/prior treatment) HPI History provided by pt.   Pt comes from independent living.  Has had diffuse, exertional chest tightness for the past 7 days.  Until today, pain resolved w/ rest, but now persistent.  Received aspirin and ntg en route and pain resolved.  Associated w/ "fogginess" but no SOB, diaphoresis or nausea.  Denies fever and cough.  H/o HTN, peripheral edema and mild aortic stenosis but otherwise healthy and does not smoke cigarettes.  Per prior chart, pt was evaluated for similar CP in ED in 12/2011 and discharged home.  She followed up with [facility] in 03/2012 and it was thought that pain may be have been secondary to cholelithiasis.   Past Medical History  Diagnosis Date  . Hypertension   . Aortic stenosis     mild  . Diabetes mellitus   . Dizziness   . Gout   . Hypothyroidism     Past Surgical History  Procedure Laterality Date  . Left knee replacement    . Breast biopsies    . Cataract extraction      Family History  Problem Relation Age of Onset  . Alzheimer's disease Brother     History  Substance Use Topics  . Smoking status: Never Smoker   . Smokeless tobacco: Not on file  . Alcohol Use: No    OB History   Grav Para Term Preterm Abortions TAB SAB Ect Mult Living                  Review of Systems  All other systems reviewed and are negative.    Allergies  Review of patient's allergies indicates no known allergies.  Home Medications   Current Outpatient Rx  Name  Route  Sig  Dispense  Refill  . aspirin 325 MG tablet   Oral   Take 325 mg by mouth daily.         Marland Kitchen atenolol (TENORMIN) 50 MG tablet   Oral   Take 50 mg by mouth at bedtime.          . furosemide  (LASIX) 40 MG tablet   Oral   Take 40 mg by mouth daily as needed (for swelling).          Marland Kitchen levothyroxine (SYNTHROID, LEVOTHROID) 125 MCG tablet   Oral   Take 125 mcg by mouth daily.         Marland Kitchen olmesartan (BENICAR) 40 MG tablet   Oral   Take 40 mg by mouth daily.         . potassium chloride SA (K-DUR,KLOR-CON) 20 MEQ tablet   Oral   Take 20 mEq by mouth daily.         Marland Kitchen EXPIRED: olmesartan (BENICAR) 40 MG tablet   Oral   Take 1 tablet (40 mg total) by mouth daily.   30 tablet   0     This patient has not been since in over a year. Wi ..Marland Kitchen     BP 171/63  Pulse 72  Temp(Src) 97.9 F (36.6 C) (Oral)  Ht 5\' 4"  (1.626 m)  Wt 185 lb (83.915 kg)  BMI 31.74 kg/m2  SpO2 97%  Physical Exam  Nursing note and vitals reviewed. Constitutional: She  is oriented to person, place, and time. She appears well-developed and well-nourished. No distress.  HENT:  Head: Normocephalic and atraumatic.  Eyes:  Normal appearance  Neck: Normal range of motion.  Cardiovascular: Normal rate, regular rhythm and intact distal pulses.   Pulmonary/Chest: Effort normal and breath sounds normal. No respiratory distress. She exhibits no tenderness.  No pleuritic pain reported  Abdominal: Soft. Bowel sounds are normal. She exhibits no distension. There is no tenderness. There is no guarding.  Musculoskeletal: Normal range of motion.  2+ symmetric pitting edema LEs.  Pt reports this is her baseline.   Neurological: She is alert and oriented to person, place, and time.  Skin: Skin is warm and dry. No rash noted.  Psychiatric: She has a normal mood and affect. Her behavior is normal.    ED Course  Procedures (including critical care time)  Labs Reviewed  CBC WITH DIFFERENTIAL - Abnormal; Notable for the following:    Platelets 144 (*)    All other components within normal limits  BASIC METABOLIC PANEL - Abnormal; Notable for the following:    Potassium 3.3 (*)    Glucose, Bld 111 (*)     GFR calc non Af Amer 76 (*)    GFR calc Af Amer 88 (*)    All other components within normal limits  TROPONIN I   Dg Chest 2 View  07/31/2012  *RADIOLOGY REPORT*  Clinical Data: Chest pain.  CHEST - 2 VIEW  Comparison: 12/29/2011  Findings: Emphysematous changes in the lungs.  Scattered fibrosis. Blunting of the right costophrenic angle suggesting fluid or thickened pleura.  No pneumothorax.  Central bronchial wall thickening suggesting chronic bronchitis.  No focal consolidation. Normal heart size and pulmonary vascularity.  Calcification of the aorta.  Degenerative changes in the spine and shoulders.  IMPRESSION: Emphysematous and chronic bronchitic changes in the lungs with scattered fibrosis.  Fluid or thickened pleura in the right costophrenic angle.   Original Report Authenticated By: Burman Nieves, M.D.      1. Chest pain       MDM  79yo F w/ HTN and mild aortic stenosis presents w/ exertional chest tightness w/ radiation to LUE x 7 days.  Resolved w/ ntg en route to hospital and currently pain free.  On exam, afebrile, hypertensive, CP not reproducible, abd benign, symmetric LE pitting edema.  EKG non-ischemic but rhythm irregular, troponin neg and CXR shows chronic changes.  Consulted cardiology who recommends admission to medicine for r/o ACS.  [facility] to admit.          Otilio Miu, PA-C 07/31/12 [phone]

## 2012-07-31 NOTE — ED Notes (Signed)
Back from scan. 

## 2012-07-31 NOTE — Care Management Note (Unsigned)
    Page 1 of 1   08/02/2012     3:24:27 PM   CARE MANAGEMENT NOTE 08/02/2012  Patient:  Jacqueline [name], Jacqueline [name]   Account Number:  [id number]  Date Initiated:  07/31/2012  Documentation initiated by:  GRAVES-BIGELOW,Maurion Walkowiak  Subjective/Objective Assessment:   Pt admitted with cp.     Action/Plan:   Pt is from IDL facility and plans to return. CM  will continue to monitor for any disposition needs.   Anticipated DC Date:  08/01/2012   Anticipated DC Plan:  HOME/SELF CARE      DC Planning Services  CM consult      Choice offered to / List presented to:          [facility] arranged  HH-2 PT      HH agency  [facility] [place], [place]   Status of service:  In process, will continue to follow Medicare Important Message given?   (If response is "NO", the following Medicare IM given date fields will be blank) Date Medicare IM given:   Date Additional Medicare IM given:    Discharge Disposition:  HOME W HOME HEALTH SERVICES  Per UR Regulation:  Reviewed for med. necessity/level of care/duration of stay  If discussed at Long Length of Stay Meetings, dates discussed:    Comments:  08-02-12 1522 Tomi Bamberger, [place] [phone] Plan is for d/c with hh services PT via [facility]. Will need orders for PT services. CM will fax information once completed.   08-01-12 1547 Tomi Bamberger, [place] [phone] CM did call [facility] to see if PT services are available and they via [facility]- [phone]. CM will fax orders once written for HHPT. WIll f/u in am.

## 2012-07-31 NOTE — Progress Notes (Signed)
ANTICOAGULATION CONSULT NOTE - Follow Up Consult  Pharmacy Consult for Heparin Indication: chest pain/ACS  No Known Allergies  Patient Measurements: Height: 5\' 4"  (162.6 cm) Weight: 191 lb (86.637 kg) IBW/kg (Calculated) : 54.7 Heparin Dosing Weight: 70 kg  Vital Signs: Temp: 97.7 F (36.5 C) (02/26 0432) Temp src: Axillary (02/26 0432) BP: 175/97 mmHg (02/26 0432) Pulse Rate: 73 (02/26 0432)  Labs:  Recent Labs  07/31/12 0007 07/31/12 0535 07/31/12 0947 07/31/12 1203  HGB 14.1 14.2  --   --   HCT 41.5 41.5  --   --   PLT 144* 144*  --   --   HEPARINUNFRC  --   --   --  0.18*  CREATININE 0.60 0.57  --   --   TROPONINI <0.30 <0.30 <0.30  --     Estimated Creatinine Clearance: 46.8 ml/min (by C-G formula based on Cr of 0.57).   Medications:  Infusions:  . heparin 750 Units/hr (07/31/12 0520)    Assessment: 79 y/o female on heparin for chest pain. Heparin level is SUBtherapeutic on 750 units/hr. Plan is to treat chest pain conservatively. No bleeding noted, H/H are normal, platelets are low at 144K.  Goal of Therapy:  Heparin level 0.3-0.7 units/ml Monitor platelets by anticoagulation protocol: Yes   Plan:  -Heparin 2000 units IV bolus then increase heparin drip to 1000 units/hr -Heparin level 8 hours after rate change -Daily heparin level and CBC while on heparin -Monitor for signs/symptoms of bleeding  [facility], [address].D., BCPS Clinical Pharmacist Pager: [phone] 07/31/2012 1:21 PM

## 2012-07-31 NOTE — Progress Notes (Signed)
Patient ID: Jacqueline [name]  female  ZOX:[id number]    DOB: 1916-11-24    DOA: 07/30/2012  PCP: Warrick Parisian, MD  Assessment/Plan: Principal Problem:   Unstable angina:  - 3 sets of troponins negative, possibly from uncontrolled hypertension  - Cardiology consultation was obtained and they discussed the possibility of cardiac cath however patient preferred pursuing conservative management with better BP control. - She was placed on Avapro, amlodipine and Tenormin for BP control, aspirin - Heparin drip was discontinued  Active Problems:   Hypertension: See #1   Aortic stenosis: Per cardiology, outpatient 2-D echocardiogram, her primary cardiologist is Dr. Swaziland  DVT Prophylaxis:  Code Status:  Disposition: Hopefully in a.m. once BP is better controlled  Subjective: Chest pain improving at the time of my encounter, feeling cold, no shortness of breath or nausea.  Objective: Weight change:   Intake/Output Summary (Last 24 hours) at 07/31/12 1451 Last data filed at 07/31/12 1323  Gross per 24 hour  Intake    600 ml  Output   1100 ml  Net   -500 ml   Blood pressure 167/88, pulse 68, temperature 97.6 F (36.4 C), temperature source Oral, resp. rate 18, height 5\' 4"  (1.626 m), weight 86.637 kg (191 lb), SpO2 95.00%.  Physical Exam: General: Alert and awake, oriented x3, not in any acute distress. HEENT: anicteric sclera, pupils reactive to light and accommodation, EOMI CVS: S1-S2 clear, Chest: clear to auscultation bilaterally, no wheezing, rales or rhonchi Abdomen: soft nontender, nondistended, normal bowel sounds, no organomegaly Extremities: no cyanosis, clubbing or edema noted bilaterally Neuro: Cranial nerves II-XII intact, no focal neurological deficits  Lab Results: Basic Metabolic Panel:  Recent Labs Lab 07/31/12 0007 07/31/12 0535  NA 138 141  K 3.3* 3.1*  CL 104 108  CO2 24 24  GLUCOSE 111* 99  BUN 21 17  CREATININE 0.60 0.57  CALCIUM 9.3 9.1   Liver  Function Tests: No results found for this basename: AST, ALT, ALKPHOS, BILITOT, PROT, ALBUMIN,  in the last 168 hours No results found for this basename: LIPASE, AMYLASE,  in the last 168 hours No results found for this basename: AMMONIA,  in the last 168 hours CBC:  Recent Labs Lab 07/31/12 0007 07/31/12 0535  WBC 8.3 6.3  NEUTROABS 6.4  --   HGB 14.1 14.2  HCT 41.5 41.5  MCV 88.5 87.9  PLT 144* 144*   Cardiac Enzymes:  Recent Labs Lab 07/31/12 0007 07/31/12 0535 07/31/12 0947  TROPONINI <0.30 <0.30 <0.30   BNP: No components found with this basename: POCBNP,  CBG: No results found for this basename: GLUCAP,  in the last 168 hours   Micro Results: No results found for this or any previous visit (from the past 240 hour(s)).  Studies/Results: Dg Chest 2 View  07/31/2012  *RADIOLOGY REPORT*  Clinical Data: Chest pain.  CHEST - 2 VIEW  Comparison: 12/29/2011  Findings: Emphysematous changes in the lungs.  Scattered fibrosis. Blunting of the right costophrenic angle suggesting fluid or thickened pleura.  No pneumothorax.  Central bronchial wall thickening suggesting chronic bronchitis.  No focal consolidation. Normal heart size and pulmonary vascularity.  Calcification of the aorta.  Degenerative changes in the spine and shoulders.  IMPRESSION: Emphysematous and chronic bronchitic changes in the lungs with scattered fibrosis.  Fluid or thickened pleura in the right costophrenic angle.   Original Report Authenticated By: Burman Nieves, M.D.     Medications: Scheduled Meds: . amLODipine  5 mg Oral Daily  .  aspirin  325 mg Oral Daily  . atenolol  50 mg Oral QHS  . atorvastatin  20 mg Oral q1800  . irbesartan  300 mg Oral Daily  . levothyroxine  125 mcg Oral Daily  . potassium chloride SA  20 mEq Oral Daily  . sodium chloride  3 mL Intravenous Q12H  . sodium chloride  3 mL Intravenous Q12H      LOS: 1 day   Terrionna Bridwell M.D. [facility] 07/31/2012,  2:51 PM Pager: [phone]  If 7PM-7AM, please contact night-coverage [email or web address] Password [email or web address]

## 2012-07-31 NOTE — Progress Notes (Signed)
Met with patient who states she has lived at [facility] for 9 years- [place] at [place]- will advise RNCM for any possible HH/DME needs at d/c. CSW will sign off- please reconsult if CSW needs arise- Reece Levy, MSW, Theresia Majors [phone]

## 2012-08-01 DIAGNOSIS — I10 Essential (primary) hypertension: Secondary | ICD-10-CM

## 2012-08-01 LAB — CBC
HCT: 42.5 % (ref 36.0–46.0)
Hemoglobin: 14.6 g/dL (ref 12.0–15.0)
MCV: 88.4 fL (ref 78.0–100.0)
RBC: 4.81 MIL/uL (ref 3.87–5.11)
RDW: 13.9 % (ref 11.5–15.5)
WBC: 8.2 10*3/uL (ref 4.0–10.5)

## 2012-08-01 MED ORDER — BISACODYL 10 MG RE SUPP
10.0000 mg | Freq: Once | RECTAL | Status: DC
  Filled 2012-08-01: qty 1

## 2012-08-01 MED ORDER — CARVEDILOL 25 MG PO TABS
25.0000 mg | ORAL_TABLET | Freq: Two times a day (BID) | ORAL | Status: DC
  Filled 2012-08-01 (×2): qty 1

## 2012-08-01 MED ORDER — HYDRALAZINE HCL 20 MG/ML IJ SOLN
INTRAMUSCULAR | Status: AC
  Filled 2012-08-01: qty 1

## 2012-08-01 MED ORDER — FUROSEMIDE 40 MG PO TABS
40.0000 mg | ORAL_TABLET | Freq: Every day | ORAL | Status: DC
  Administered 2012-08-01 – 2012-08-02 (×2): 40 mg via ORAL
  Filled 2012-08-01 (×3): qty 1

## 2012-08-01 MED ORDER — AMLODIPINE BESYLATE 10 MG PO TABS
10.0000 mg | ORAL_TABLET | Freq: Every day | ORAL | Status: DC
  Administered 2012-08-01 – 2012-08-06 (×6): 10 mg via ORAL
  Filled 2012-08-01 (×7): qty 1

## 2012-08-01 MED ORDER — DOCUSATE SODIUM 100 MG PO CAPS
100.0000 mg | ORAL_CAPSULE | Freq: Every day | ORAL | Status: DC
  Administered 2012-08-01 – 2012-08-06 (×6): 100 mg via ORAL
  Filled 2012-08-01 (×6): qty 1

## 2012-08-01 MED ORDER — CARVEDILOL 12.5 MG PO TABS
12.5000 mg | ORAL_TABLET | Freq: Two times a day (BID) | ORAL | Status: DC
  Filled 2012-08-01 (×3): qty 1

## 2012-08-01 MED ORDER — HYDRALAZINE HCL 20 MG/ML IJ SOLN
5.0000 mg | Freq: Once | INTRAMUSCULAR | Status: AC
  Administered 2012-08-01: 5 mg via INTRAVENOUS

## 2012-08-01 MED ORDER — ATENOLOL 50 MG PO TABS
75.0000 mg | ORAL_TABLET | Freq: Every day | ORAL | Status: DC
  Filled 2012-08-01: qty 1

## 2012-08-01 NOTE — Progress Notes (Signed)
[facility] on call paged with bp 194/67 hr 63. Pt is asymptomatic. Will continue to monitor. Ilean Skill LPN

## 2012-08-01 NOTE — Progress Notes (Signed)
   TELEMETRY: Reviewed telemetry pt in NSR with occ PVCs.Ceasar Mons Vitals:   07/31/12 0432 07/31/12 1320 07/31/12 2005 08/01/12 0452  BP: 175/97 167/88 182/60 194/67  Pulse: 73 68 72 63  Temp: 97.7 F (36.5 C) 97.6 F (36.4 C) 97.5 F (36.4 C) 97.7 F (36.5 C)  TempSrc: Axillary Oral Oral Oral  Resp:  18    Height: 5\' 4"  (1.626 m)     Weight: 191 lb (86.637 kg)   190 lb 14.4 oz (86.592 kg)  SpO2: 97% 95% 97% 97%    Intake/Output Summary (Last 24 hours) at 08/01/12 0826 Last data filed at 08/01/12 6213  Gross per 24 hour  Intake 1113.5 ml  Output   1650 ml  Net -536.5 ml    SUBJECTIVE Denies any chest pain. No SOB. Vision clearing. Feels like she can get up and around today which she hasn't been doing.  LABS: Basic Metabolic Panel:  Recent Labs  08/65/78 0007 07/31/12 0535  NA 138 141  K 3.3* 3.1*  CL 104 108  CO2 24 24  GLUCOSE 111* 99  BUN 21 17  CREATININE 0.60 0.57  CALCIUM 9.3 9.1   CBC:  Recent Labs  07/31/12 0007 07/31/12 0535 08/01/12 0524  WBC 8.3 6.3 8.2  NEUTROABS 6.4  --   --   HGB 14.1 14.2 14.6  HCT 41.5 41.5 42.5  MCV 88.5 87.9 88.4  PLT 144* 144* 146*   Cardiac Enzymes:  Recent Labs  07/31/12 0007 07/31/12 0535 07/31/12 0947  TROPONINI <0.30 <0.30 <0.30   Hemoglobin A1C:  Recent Labs  07/31/12 0535  HGBA1C 5.9*   Radiology/Studies:  Dg Chest 2 View  07/31/2012  *RADIOLOGY REPORT*  Clinical Data: Chest pain.  CHEST - 2 VIEW  Comparison: 12/29/2011  Findings: Emphysematous changes in the lungs.  Scattered fibrosis. Blunting of the right costophrenic angle suggesting fluid or thickened pleura.  No pneumothorax.  Central bronchial wall thickening suggesting chronic bronchitis.  No focal consolidation. Normal heart size and pulmonary vascularity.  Calcification of the aorta.  Degenerative changes in the spine and shoulders.  IMPRESSION: Emphysematous and chronic bronchitic changes in the lungs with scattered fibrosis.  Fluid or  thickened pleura in the right costophrenic angle.   Original Report Authenticated By: Burman Nieves, M.D.     PHYSICAL EXAM General: Well developed, eldely, in no acute distress. Head: Normocephalic, atraumatic, sclera non-icteric, no xanthomas, nares are without discharge. Neck: Negative for carotid bruits. JVD not elevated. Lungs: Clear bilaterally to auscultation without wheezes, rales, or rhonchi. Breathing is unlabored. Heart: RRR S1 S2 with 2/6 harsh systolic murmur RUSB>>apex. +S4.  Abdomen: Soft, non-tender, non-distended with normoactive bowel sounds. No hepatomegaly. No rebound/guarding. No obvious abdominal masses. Msk:  Strength and tone appears normal for age. Extremities: 1+ edema.  Distal pedal pulses are 2+ and equal bilaterally. Neuro: Alert and oriented X 3. Moves all extremities spontaneously. Psych:  Responds to questions appropriately with a normal affect.  ASSESSMENT AND PLAN: 1. Chest pain. Patient has ruled out for MI. Agree with conservative therapy. BP still not optimal. 2. Severe HTN- now on Norvasc. Lasix started for edema. I would favor stopping atenolol since its effects on BP are pretty mild. Will start carvedilol instead. Continue ARB. 3. Mild aortic stenosis.  Principal Problem:   Unstable angina Active Problems:   Hypertension   Aortic stenosis   Atypical chest pain    Signed, Peter Swaziland MD,FACC 08/01/2012 8:31 AM

## 2012-08-01 NOTE — Progress Notes (Signed)
Patient ID: Jacqueline [name]  female  WUJ:[id number]    DOB: [birth year]    DOA: 07/30/2012  PCP: Warrick Parisian, MD  Assessment/Plan: Principal Problem:   Unstable angina: likely secondary to uncontrolled hypertension - 3 sets of troponins negative, Cardiology consultation was obtained and they discussed the possibility of cardiac cath however patient preferred pursuing conservative management with better BP control. - She was placed on Avapro, amlodipine and BBfor BP control, aspirin - Heparin drip was discontinued  Active Problems:   Hypertension: Still uncontrolled - Several changes made today, discussed with Dr. Swaziland in detail, recommended to discontinue Tenormin. - Increase amlodipine today, placed on Coreg, placed on Lasix for peripheral edema    Aortic stenosis: Per cardiology, outpatient 2-D echocardiogram, her primary cardiologist is Dr. Swaziland  DVT Prophylaxis:  Code Status: FC  Disposition: Hopefully in a.m. once BP is better controlled  Subjective: No chest pain, no shortness of breath or nausea. BP still uncontrolled  Objective: Weight change: 2.676 kg (5 lb 14.4 oz)  Intake/Output Summary (Last 24 hours) at 08/01/12 1153 Last data filed at 08/01/12 0900  Gross per 24 hour  Intake 1113.5 ml  Output   1250 ml  Net -136.5 ml   Blood pressure 132/67, pulse 57, temperature 97.7 F (36.5 C), temperature source Oral, resp. rate 18, height 5\' 4"  (1.626 m), weight 86.592 kg (190 lb 14.4 oz), SpO2 97.00%.  Physical Exam: General: Alert and awake, oriented x3, not in any acute distress. CVS: S1-S2 clear, 2/6SEM Chest: CTAB Abdomen: soft nontender, nondistended, normal bowel sounds, Extremities: no cyanosis, clubbing. 1+ edema   Lab Results: Basic Metabolic Panel:  Recent Labs Lab 07/31/12 0007 07/31/12 0535  NA 138 141  K 3.3* 3.1*  CL 104 108  CO2 24 24  GLUCOSE 111* 99  BUN 21 17  CREATININE 0.60 0.57  CALCIUM 9.3 9.1   CBC:  Recent Labs Lab  07/31/12 0007 07/31/12 0535 08/01/12 0524  WBC 8.3 6.3 8.2  NEUTROABS 6.4  --   --   HGB 14.1 14.2 14.6  HCT 41.5 41.5 42.5  MCV 88.5 87.9 88.4  PLT 144* 144* 146*   Cardiac Enzymes:  Recent Labs Lab 07/31/12 0007 07/31/12 0535 07/31/12 0947  TROPONINI <0.30 <0.30 <0.30   BNP: No components found with this basename: POCBNP,  CBG: No results found for this basename: GLUCAP,  in the last 168 hours   Micro Results: No results found for this or any previous visit (from the past 240 hour(s)).  Studies/Results: Dg Chest 2 View  07/31/2012  *RADIOLOGY REPORT*  Clinical Data: Chest pain.  CHEST - 2 VIEW  Comparison: 12/29/2011  Findings: Emphysematous changes in the lungs.  Scattered fibrosis. Blunting of the right costophrenic angle suggesting fluid or thickened pleura.  No pneumothorax.  Central bronchial wall thickening suggesting chronic bronchitis.  No focal consolidation. Normal heart size and pulmonary vascularity.  Calcification of the aorta.  Degenerative changes in the spine and shoulders.  IMPRESSION: Emphysematous and chronic bronchitic changes in the lungs with scattered fibrosis.  Fluid or thickened pleura in the right costophrenic angle.   Original Report Authenticated By: Burman Nieves, M.D.     Medications: Scheduled Meds: . amLODipine  10 mg Oral Daily  . aspirin  325 mg Oral Daily  . atorvastatin  20 mg Oral q1800  . carvedilol  25 mg Oral BID WC  . furosemide  40 mg Oral Q breakfast  . hydrALAZINE      . irbesartan  300 mg Oral Daily  . levothyroxine  125 mcg Oral Daily  . potassium chloride SA  20 mEq Oral Daily  . sodium chloride  3 mL Intravenous Q12H  . sodium chloride  3 mL Intravenous Q12H      LOS: 2 days   Jene Huq M.D. [facility] 08/01/2012, 11:53 AM Pager: [phone]  If 7PM-7AM, please contact night-coverage [email or web address] Password [email or web address]

## 2012-08-01 NOTE — ED Provider Notes (Signed)
Medical screening examination/treatment/procedure(s) were conducted as a shared visit with non-physician practitioner(s) and myself.  I personally evaluated the patient during the encounter  Chest pain with may represent anginal equivalent.  Patient be admitted the hospital for ongoing workup.  Cardiology consult.  Admit to the hospitalist  Lyanne Co, MD 08/01/12 [phone]

## 2012-08-01 NOTE — Progress Notes (Signed)
Clinical Social Work Department BRIEF PSYCHOSOCIAL ASSESSMENT 08/01/2012  Patient:  Jacqueline [name], Jacqueline [name]     Account Number:  [id number]     Admit date:  07/30/2012  Clinical Social Worker:  Robin Searing  Date/Time:  07/31/2012 10:24 AM  Referred by:  Physician  Date Referred:  07/31/2012 Referred for  SNF Placement   Other Referral:   Interview type:  Patient Other interview type:    PSYCHOSOCIAL DATA Living Status:  FACILITY Admitted from facility:  [facility] Level of care:  Independent Living Primary support name:  SON- JOHN Primary support relationship to patient:  FAMILY Degree of support available:   GOOD    CURRENT CONCERNS Current Concerns  Post-Acute Placement   Other Concerns:    SOCIAL WORK ASSESSMENT / PLAN Patient admitted from [facility] apartment at [facility]- she has lived there for the past 9 years. She anticipates return to [facility] and is familiar and aware of the ALF and SNF options if needed at d/c.   Assessment/plan status:  Other - See comment Other assessment/ plan:   Await PT eval/recommendations   Information/referral to community resources:   SNF  ALF  Hh    PATIENT'S/FAMILY'S RESPONSE TO PLAN OF CARE: Patient receptive to above- will f/u after PT eval.     Reece Levy, MSW, [facility] [phone]

## 2012-08-01 NOTE — Progress Notes (Signed)
New orders received will continue to monitor. Ravyn Nikkel LPN 

## 2012-08-01 NOTE — Progress Notes (Signed)
Spoke to pt who plans on going home to her apartment in [place] independent living. Awaiting PT for evaluations and recommendations. CSW will follow up after PT consult to further assess.  Sherald Barge, LCSW-A Clinical Social Worker [phone]

## 2012-08-01 NOTE — Evaluation (Signed)
Physical Therapy Evaluation Patient Details Name: Jacqueline [name] MRN: [id number] DOB: 1916-06-23 Today's Date: 08/01/2012 Time: 4098-1191 PT Time Calculation (min): 35 min  PT Assessment / Plan / Recommendation Clinical Impression  Pt. is 79 yo female admitted 07/31/12 with exertional chest tightness x 7 days. For r/o MI. -trop.x3. Has had Hypertension. today BP pre/sitting135/70, after ambulation 151/80. Pt. demonstrates loss of balance when first standing, difficulty standing from chair/toilet. Pt. reports several falls in her apt. Recommend closer supervision and HHPT.    PT Assessment  Patient needs continued PT services    Follow Up Recommendations  Home health PT    Does the patient have the potential to tolerate intense rehabilitation      Barriers to Discharge Decreased caregiver support      Equipment Recommendations  None recommended by PT    Recommendations for Other Services     Frequency Min 3X/week    Precautions / Restrictions Precautions Precautions: Fall   Pertinent Vitals/Pain See clinical impr for BP/      Mobility  Bed Mobility Bed Mobility: Not assessed Transfers Transfers: Sit to Stand;Stand to Sit Sit to Stand: 4: Min guard;With upper extremity assist;From chair/3-in-1;From toilet;With armrests Stand to Sit: 4: Min guard;To chair/3-in-1;To toilet;With upper extremity assist;With armrests Details for Transfer Assistance: Pt. had difficulty with sit to stand from surfaces, cues to push with UE's. Pt made several attempts to stand before getting up, then had to move feet to get balanced. Ambulation/Gait Ambulation/Gait Assistance: 4: Min assist Ambulation Distance (Feet): 200 Feet Assistive device: Rolling walker Ambulation/Gait Assistance Details: support for loss of balance while at sink,and for maneuvering around objects w/ RW.,   Gait Pattern: Step-through pattern;Decreased stride length Gait velocity: decr.    Exercises     PT Diagnosis:  Generalized weakness;Difficulty walking  PT Problem List: Decreased balance;Decreased activity tolerance;Decreased safety awareness;Decreased knowledge of precautions PT Treatment Interventions: DME instruction;Gait training;Functional mobility training;Therapeutic activities;Therapeutic exercise;Balance training;Patient/family education   PT Goals Acute Rehab PT Goals PT Goal Formulation: With patient Time For Goal Achievement: 08/08/12 Potential to Achieve Goals: Good Pt will go Sit to Stand: with modified independence Pt will go Stand to Sit: with modified independence PT Goal: Stand to Sit - Progress: Goal set today Pt will Ambulate: 16 - 50 feet;with modified independence;with rolling walker PT Goal: Ambulate - Progress: Goal set today  Visit Information  Last PT Received On: 08/01/12 Assistance Needed: +1    Subjective Data  Subjective: I have fallen a lot. I have a box to call for help. Patient Stated Goal: to go home today.   Prior Functioning  Home Living Lives With: Alone Type of Home: Independent living facility Home Layout: One level Bathroom Shower/Tub: Health visitor: Handicapped height Home Adaptive Equipment: Built-in shower seat;Grab bars around toilet;Grab bars in shower;Walker - rolling;Other (comment);Straight cane (scooter) Prior Function Level of Independence: Independent with assistive device(s) Driving: No Comments: Pt. uses scooter for going to meals. Communication Communication: No difficulties    Cognition  Cognition Overall Cognitive Status: Appears within functional limits for tasks assessed/performed Area of Impairment: Safety/judgement Arousal/Alertness: Awake/alert Orientation Level: Appears intact for tasks assessed Behavior During Session: [facility] for tasks performed Safety/Judgement: Decreased awareness of need for assistance Safety/Judgement - Other Comments: Pt. reports several falls in apt.. does not acknowledge need for  assistance.    Extremity/Trunk Assessment Right Upper Extremity Assessment RUE ROM/Strength/Tone: [facility] for tasks assessed Left Upper Extremity Assessment LUE ROM/Strength/Tone: [facility] for tasks assessed Right Lower Extremity  Assessment RLE ROM/Strength/Tone: [facility] for tasks assessed Left Lower Extremity Assessment LLE ROM/Strength/Tone: WFL for tasks assessed   Balance Balance Balance Assessed: Yes Static Standing Balance Static Standing - Balance Support: Right upper extremity supported Static Standing - Level of Assistance: 5: Stand by assistance Static Standing - Comment/# of Minutes: while standing at sink, pt turned and had a loss of balance.  End of Session PT - End of Session Equipment Utilized During Treatment: Gait belt Activity Tolerance: Patient tolerated treatment well Patient left: in chair;with call bell/phone within reach;with chair alarm set Nurse Communication: Mobility status  GP Functional Assessment Tool Used: clinical judgement Functional Limitation: Mobility: Walking and moving around Mobility: Walking and Moving Around Current Status (Z6109): At least 1 percent but less than 20 percent impaired, limited or restricted Mobility: Walking and Moving Around Goal Status [phone]): 0 percent impaired, limited or restricted   Rada Hay 08/01/2012, 3:18 PM

## 2012-08-02 ENCOUNTER — Encounter (HOSPITAL_COMMUNITY)
Admission: EM | Disposition: A | Discharge status: Nursing Home (Includes ICF) | Payer: Self-pay | Source: Home / Self Care | Attending: Cardiology

## 2012-08-02 DIAGNOSIS — I2510 Atherosclerotic heart disease of native coronary artery without angina pectoris: Secondary | ICD-10-CM

## 2012-08-02 HISTORY — PX: LEFT HEART CATHETERIZATION WITH CORONARY ANGIOGRAM: SHX5451

## 2012-08-02 LAB — MRSA PCR SCREENING: MRSA by PCR: NEGATIVE

## 2012-08-02 LAB — BASIC METABOLIC PANEL
BUN: 17 mg/dL (ref 6–23)
CO2: 23 mEq/L (ref 19–32)
Calcium: 9.2 mg/dL (ref 8.4–10.5)
Creatinine, Ser: 0.72 mg/dL (ref 0.50–1.10)
GFR calc non Af Amer: 72 mL/min — ABNORMAL LOW (ref >90)
Glucose, Bld: 128 mg/dL — ABNORMAL HIGH (ref 70–99)

## 2012-08-02 LAB — CBC
HCT: 43.9 % (ref 36.0–46.0)
Hemoglobin: 15.4 g/dL — ABNORMAL HIGH (ref 12.0–15.0)
MCH: 30.8 pg (ref 26.0–34.0)
MCHC: 35.1 g/dL (ref 30.0–36.0)
MCV: 87.8 fL (ref 78.0–100.0)
RBC: 5 MIL/uL (ref 3.87–5.11)

## 2012-08-02 LAB — PROTIME-INR: INR: 1.03 (ref 0.00–1.49)

## 2012-08-02 SURGERY — LEFT HEART CATHETERIZATION WITH CORONARY ANGIOGRAM
Anesthesia: LOCAL

## 2012-08-02 MED ORDER — WHITE PETROLATUM GEL
Status: AC
  Administered 2012-08-02: 0.2
  Filled 2012-08-02: qty 5

## 2012-08-02 MED ORDER — HEPARIN SODIUM (PORCINE) 1000 UNIT/ML IJ SOLN
INTRAMUSCULAR | Status: AC
  Filled 2012-08-02: qty 1

## 2012-08-02 MED ORDER — SODIUM CHLORIDE 0.9 % IJ SOLN: 3.0000 mL | Freq: Two times a day (BID) | INTRAMUSCULAR | Status: DC

## 2012-08-02 MED ORDER — SODIUM CHLORIDE 0.9 % IV SOLN: 250.0000 mL | INTRAVENOUS | Status: DC | PRN

## 2012-08-02 MED ORDER — SODIUM CHLORIDE 0.9 % IV SOLN: 1.0000 mL/kg/h | INTRAVENOUS | Status: AC

## 2012-08-02 MED ORDER — CLOPIDOGREL BISULFATE 75 MG PO TABS
75.0000 mg | ORAL_TABLET | Freq: Every day | ORAL | Status: DC
  Administered 2012-08-03 – 2012-08-06 (×4): 75 mg via ORAL
  Filled 2012-08-02 (×4): qty 1

## 2012-08-02 MED ORDER — HEPARIN (PORCINE) IN NACL 100-0.45 UNIT/ML-% IJ SOLN
1150.0000 [IU]/h | INTRAMUSCULAR | Status: DC
  Administered 2012-08-03 – 2012-08-04 (×3): 1150 [IU]/h via INTRAVENOUS
  Filled 2012-08-02 (×4): qty 250

## 2012-08-02 MED ORDER — ISOSORBIDE MONONITRATE ER 30 MG PO TB24
30.0000 mg | ORAL_TABLET | Freq: Every day | ORAL | Status: DC
  Administered 2012-08-02: 30 mg via ORAL
  Filled 2012-08-02: qty 1

## 2012-08-02 MED ORDER — CARVEDILOL 25 MG PO TABS
25.0000 mg | ORAL_TABLET | Freq: Two times a day (BID) | ORAL | Status: DC
  Administered 2012-08-02 – 2012-08-06 (×8): 25 mg via ORAL
  Filled 2012-08-02 (×11): qty 1

## 2012-08-02 MED ORDER — HEPARIN (PORCINE) IN NACL 2-0.9 UNIT/ML-% IJ SOLN
INTRAMUSCULAR | Status: AC
  Filled 2012-08-02: qty 1000

## 2012-08-02 MED ORDER — SODIUM CHLORIDE 0.9 % IJ SOLN: 3.0000 mL | INTRAMUSCULAR | Status: DC | PRN

## 2012-08-02 MED ORDER — ACETAMINOPHEN 325 MG PO TABS: 650.0000 mg | ORAL_TABLET | ORAL | Status: DC | PRN

## 2012-08-02 MED ORDER — DIAZEPAM 2 MG PO TABS
2.0000 mg | ORAL_TABLET | ORAL | Status: AC
  Administered 2012-08-02: 2 mg via ORAL
  Filled 2012-08-02: qty 1

## 2012-08-02 MED ORDER — CLOPIDOGREL BISULFATE 300 MG PO TABS
600.0000 mg | ORAL_TABLET | Freq: Once | ORAL | Status: AC
  Administered 2012-08-02: 600 mg via ORAL
  Filled 2012-08-02: qty 2

## 2012-08-02 MED ORDER — ONDANSETRON HCL 4 MG/2ML IJ SOLN: 4.0000 mg | Freq: Four times a day (QID) | INTRAMUSCULAR | Status: DC | PRN

## 2012-08-02 MED ORDER — HYDRALAZINE HCL 20 MG/ML IJ SOLN
10.0000 mg | Freq: Once | INTRAMUSCULAR | Status: AC
  Administered 2012-08-02: 10 mg via INTRAVENOUS
  Filled 2012-08-02: qty 1

## 2012-08-02 MED ORDER — MORPHINE SULFATE 2 MG/ML IJ SOLN
2.0000 mg | INTRAMUSCULAR | Status: DC | PRN
  Administered 2012-08-02: 2 mg via INTRAVENOUS
  Filled 2012-08-02 (×2): qty 1

## 2012-08-02 MED ORDER — LIDOCAINE HCL (PF) 1 % IJ SOLN
INTRAMUSCULAR | Status: AC
  Filled 2012-08-02: qty 30

## 2012-08-02 MED ORDER — POTASSIUM CHLORIDE CRYS ER 20 MEQ PO TBCR
40.0000 meq | EXTENDED_RELEASE_TABLET | Freq: Once | ORAL | Status: AC
  Administered 2012-08-02: 40 meq via ORAL
  Filled 2012-08-02: qty 2

## 2012-08-02 MED ORDER — NITROGLYCERIN IN D5W 200-5 MCG/ML-% IV SOLN
2.0000 ug/min | INTRAVENOUS | Status: DC
  Administered 2012-08-02: 5 ug/min via INTRAVENOUS
  Filled 2012-08-02: qty 250

## 2012-08-02 NOTE — Progress Notes (Signed)
I reviewed cardiac cath data with the patient in detail. I recommend PCI of the LAD with rotational atherectomy and stenting. Will load with Plavix. Procedure and risks explained in detail including risk of CVA, MI, bleeding, perforation, emergent surgery, death, and arrhythmia. Patient understands and wants to proceed. Placed on the schedule for Monday am.  Lainey Nelson Sw[place] MD, [facility]

## 2012-08-02 NOTE — H&P (View-Only) (Signed)
TELEMETRY: Reviewed telemetry pt in NSR with occ PVCs.Ceasar Mons Vitals:   08/01/12 2021 08/02/12 0415 08/02/12 0534 08/02/12 0554  BP: 143/49 189/69 202/84 162/76  Pulse: 66 73 82 90  Temp: 98 F (36.7 C) 98 F (36.7 C)    TempSrc: Oral Oral    Resp:      Height:      Weight:  181 lb (82.101 kg)    SpO2: 96% 97%      Intake/Output Summary (Last 24 hours) at 08/02/12 0738 Last data filed at 08/02/12 0415  Gross per 24 hour  Intake   1170 ml  Output      0 ml  Net   1170 ml    SUBJECTIVE Patient awoke with severe Left arm and upper chest pain at 5:30 am. Describes arm pain as heaviness and she has a difficult time moving it. Similar to pain on admission. Tearful and upset.  LABS: Basic Metabolic Panel:  Recent Labs  16/10/96 0007 07/31/12 0535  NA 138 141  K 3.3* 3.1*  CL 104 108  CO2 24 24  GLUCOSE 111* 99  BUN 21 17  CREATININE 0.60 0.57  CALCIUM 9.3 9.1   CBC:  Recent Labs  07/31/12 0007  08/01/12 0524 08/02/12 0640  WBC 8.3  < > 8.2 9.5  NEUTROABS 6.4  --   --   --   HGB 14.1  < > 14.6 15.4*  HCT 41.5  < > 42.5 43.9  MCV 88.5  < > 88.4 87.8  PLT 144*  < > 146* 164  < > = values in this interval not displayed. Cardiac Enzymes:  Recent Labs  07/31/12 0007 07/31/12 0535 07/31/12 0947  TROPONINI <0.30 <0.30 <0.30   Hemoglobin A1C:  Recent Labs  07/31/12 0535  HGBA1C 5.9*   Radiology/Studies:  Dg Chest 2 View  07/31/2012  *RADIOLOGY REPORT*  Clinical Data: Chest pain.  CHEST - 2 VIEW  Comparison: 12/29/2011  Findings: Emphysematous changes in the lungs.  Scattered fibrosis. Blunting of the right costophrenic angle suggesting fluid or thickened pleura.  No pneumothorax.  Central bronchial wall thickening suggesting chronic bronchitis.  No focal consolidation. Normal heart size and pulmonary vascularity.  Calcification of the aorta.  Degenerative changes in the spine and shoulders.  IMPRESSION: Emphysematous and chronic bronchitic changes in the  lungs with scattered fibrosis.  Fluid or thickened pleura in the right costophrenic angle.   Original Report Authenticated By: Burman Nieves, M.D.     PHYSICAL EXAM General: Well developed, eldely, anxious. Head: Normocephalic, atraumatic, sclera non-icteric, no xanthomas, nares are without discharge. Neck: Negative for carotid bruits. JVD not elevated. Lungs: Clear bilaterally to auscultation without wheezes, rales, or rhonchi. Breathing is unlabored. Heart: RRR S1 S2 with 2/6 harsh systolic murmur RUSB>>apex. +S4.  Abdomen: Soft, non-tender, non-distended with normoactive bowel sounds. No hepatomegaly. No rebound/guarding. No obvious abdominal masses. Msk:  Strength and tone appears normal for age. Extremities: 1+ edema.  Distal pedal pulses are 2+ and equal bilaterally. Full ROM left shoulder without tenderness. Neuro: Alert and oriented X 3. Moves all extremities spontaneously. Psych:  Responds to questions appropriately with a normal affect.  ASSESSMENT AND PLAN: 1. Unstable angina. Recurrent despite good BP control yesterday. Will cycle cardiac enzymes again and check 12 lead Ecg. Discussed cardiac cath but patient states she does not want this. Procedure described for her. Will give prn IV Mso4. Increase coreg to 25 mg bid and add Imdur 30 mg daily.  2.  Severe HTN- BP much better yesterday with medication adjustments but BP spiked with chest and left arm pain today. Adjustments as noted above. 3. Mild aortic stenosis.  Principal Problem:   Unstable angina Active Problems:   Hypertension   Aortic stenosis   Atypical chest pain    Signed, Peter Swaziland MD,FACC 08/02/2012 7:38 AM

## 2012-08-02 NOTE — Progress Notes (Signed)
Pt woke up with left arm pain, 7/10, non radiating. BP was 202/84, HR 82. Pt was emotionally upset and tearful, continued to state that "Jesus was coming for her." NP Schorr notified and ordered 10mg  of Hydralazine and to give IV morphine for pain. Emotional support given to pt. She asked to call her minister, attempt was made but no answer. Chaplain called and is now at bedside. Will continue to monitor.

## 2012-08-02 NOTE — Progress Notes (Signed)
Patient ID: Jacqueline [name]  female  ZOX:[id number]    DOB: 1917/03/08    DOA: 07/30/2012  PCP: Warrick Parisian, MD  Assessment/Plan: Principal Problem:   Unstable angina: likely secondary to uncontrolled hypertension, CP again this AM - 3 sets of troponins negative, cardiology following, patient agreed for cardiac cath today. Initially she had declined in favor for conservative mgmt - On Avapro, amlodipine and BB for BP control, aspirin - Heparin drip was discontinued  Active Problems:   Hypertension: somewhat improved yesterday, then spiked again  - placed on imdur, increased coreg      Aortic stenosis: cardiology following  DVT Prophylaxis:  Code Status: FC  Disposition:cath today  Subjective:  chest pain episode this AM, now resolved, no shortness of breath or nausea  Objective: Weight change: -4.491 kg (-9 lb 14.4 oz)  Intake/Output Summary (Last 24 hours) at 08/02/12 1207 Last data filed at 08/02/12 0415  Gross per 24 hour  Intake    930 ml  Output      0 ml  Net    930 ml   Blood pressure 111/69, pulse 72, temperature 98 F (36.7 C), temperature source Oral, resp. rate 18, height 5\' 4"  (1.626 m), weight 82.101 kg (181 lb), SpO2 96.00%.  Physical Exam: General: Alert and awake, oriented x3, not in any acute distress. CVS: S1-S2 clear, 2/6SEM Chest: CTAB Abdomen: soft, NT, ND, NBS, Extremities: no c/c. Trace edema   Lab Results: Basic Metabolic Panel:  Recent Labs Lab 07/31/12 0535 08/02/12 0640  NA 141 139  K 3.1* 3.1*  CL 108 104  CO2 24 23  GLUCOSE 99 128*  BUN 17 17  CREATININE 0.57 0.72  CALCIUM 9.1 9.2   CBC:  Recent Labs Lab 07/31/12 0007  08/01/12 0524 08/02/12 0640  WBC 8.3  < > 8.2 9.5  NEUTROABS 6.4  --   --   --   HGB 14.1  < > 14.6 15.4*  HCT 41.5  < > 42.5 43.9  MCV 88.5  < > 88.4 87.8  PLT 144*  < > 146* 164  < > = values in this interval not displayed. Cardiac Enzymes:  Recent Labs Lab 07/31/12 0007 07/31/12 0535  07/31/12 0947  TROPONINI <0.30 <0.30 <0.30   BNP: No components found with this basename: POCBNP,  CBG: No results found for this basename: GLUCAP,  in the last 168 hours   Micro Results: No results found for this or any previous visit (from the past 240 hour(s)).  Studies/Results: Dg Chest 2 View  07/31/2012  *RADIOLOGY REPORT*  Clinical Data: Chest pain.  CHEST - 2 VIEW  Comparison: 12/29/2011  Findings: Emphysematous changes in the lungs.  Scattered fibrosis. Blunting of the right costophrenic angle suggesting fluid or thickened pleura.  No pneumothorax.  Central bronchial wall thickening suggesting chronic bronchitis.  No focal consolidation. Normal heart size and pulmonary vascularity.  Calcification of the aorta.  Degenerative changes in the spine and shoulders.  IMPRESSION: Emphysematous and chronic bronchitic changes in the lungs with scattered fibrosis.  Fluid or thickened pleura in the right costophrenic angle.   Original Report Authenticated By: Burman Nieves, M.D.     Medications: Scheduled Meds: . amLODipine  10 mg Oral Daily  . aspirin  325 mg Oral Daily  . atorvastatin  20 mg Oral q1800  . bisacodyl  10 mg Rectal Once  . carvedilol  25 mg Oral BID WC  . diazepam  2 mg Oral On Call  . docusate sodium  100 mg Oral Daily  . furosemide  40 mg Oral Q breakfast  . irbesartan  300 mg Oral Daily  . isosorbide mononitrate  30 mg Oral Daily  . levothyroxine  125 mcg Oral Daily  . potassium chloride SA  20 mEq Oral Daily  . sodium chloride  3 mL Intravenous Q12H  . sodium chloride  3 mL Intravenous Q12H  . sodium chloride  3 mL Intravenous Q12H      LOS: 3 days   RAI,RIPUDEEP M.D. [facility] 08/02/2012, 12:07 PM Pager: [phone]  If 7PM-7AM, please contact night-coverage [email or web address] Password [email or web address]

## 2012-08-02 NOTE — Progress Notes (Signed)
PT Cancellation Note  Patient Details Name: Jacqueline [name] MRN: [id number] DOB: [birth year]   Cancelled Treatment:    Reason Eval/Treat Not Completed: Patient at procedure or test/unavailable   Donnetta Hail 08/02/2012, 1:31 PM

## 2012-08-02 NOTE — Plan of Care (Signed)
Discussed with Dr. Excell Seltzer [facility] cardiology) in detail, will assume care for Mrs Raymond. Dr Swaziland as attending MD. Cardiac cath done today, PCI of LAD to be planned on Monday. No other active medical issues, I will sign off.     Maansi Wike M.D. [facility] 08/02/2012, 6:26 PM  Pager: [phone]

## 2012-08-02 NOTE — Progress Notes (Signed)
ANTICOAGULATION CONSULT NOTE - Follow Up Consult  Pharmacy Consult for Heparin Indication: chest pain/ACS  No Known Allergies  Patient Measurements: Height: 5\' 4"  (162.6 cm) Weight: 181 lb (82.101 kg) IBW/kg (Calculated) : 54.7 Heparin Dosing Weight: 70 kg  Vital Signs: BP: 118/38 mmHg (02/28 1830) Pulse Rate: 66 (02/28 1830)  Labs:  Recent Labs  07/31/12 0007 07/31/12 0535 07/31/12 0947 07/31/12 1203 08/01/12 0524 08/02/12 0640 08/02/12 1040  HGB 14.1 14.2  --   --  14.6 15.4*  --   HCT 41.5 41.5  --   --  42.5 43.9  --   PLT 144* 144*  --   --  146* 164  --   LABPROT  --   --   --   --   --   --  13.4  INR  --   --   --   --   --   --  1.03  HEPARINUNFRC  --   --   --  0.18*  --   --   --   CREATININE 0.60 0.57  --   --   --  0.72  --   TROPONINI <0.30 <0.30 <0.30  --   --   --   --     Estimated Creatinine Clearance: 45.6 ml/min (by C-G formula based on Cr of 0.72).   Medications:  Infusions:  . sodium chloride 1 mL/kg/hr (08/02/12 1800)  . nitroGLYCERIN 5 mcg/min (08/02/12 1829)    Assessment: 79 y/o female admitted 07/30/2012 with chest pain. S/P cath today, plan for PCI and arthrectomy on Monday to resume heparin over the weekend.   Goal of Therapy:  Heparin level 0.3-0.7 units/ml Monitor platelets by anticoagulation protocol: Yes   Plan:  -Heparin 1250 units/hr -Heparin level 8 hours  -Daily heparin level and CBC while on heparin -Monitor for signs/symptoms of bleeding   Thank you for allowing pharmacy to be a part of this patients care team.  Lovenia Kim Pharm.D., BCPS Clinical Pharmacist 08/02/2012 7:31 PM Pager: [phone] Phone: [phone]

## 2012-08-02 NOTE — Progress Notes (Signed)
Chaplain responded to request from Nursing Unit to offer pastoral support to patient who was emotionally  upset and request  a visit from [place].  Patient is a 79 year old female who talked about wanting to see Jesus and going to heaven.  She was very reflective about her life and the things had enjoyed doing and now felt she wasn't able to do those things any more. Her family lives out of state and she misses them a great deal.  She was sad over her current health status and not being able to do the things she was use to doing.  Chaplain had ministry of presence, and listening ear, and offered prayer for patient. She was appreciative of time and conversation and would greatly appreciate and follow-up pastoral visit. Chaplain Janell Quiet [phone]

## 2012-08-02 NOTE — CV Procedure (Signed)
   Cardiac Catheterization Procedure Note  Name: Jacqueline [name] MRN: [id number] DOB: [birth year]  Procedure: Left Heart Cath, Selective Coronary Angiography, LV angiography  Indication: 79 year old white female with history of severe hypertension and hyperlipidemia presents with unstable angina. She has had refractory chest and left shoulder pain despite optimal medical therapy.   Procedural details: The initial access was via the right radial artery. The site was prepped and draped in a sterile fashion. Anesthesia with 1% lidocaine. The radial artery was accessed using the modified Seldinger technique. A 5 French sheath was inserted. We were able to pass a right coronary catheter into the proximal aorta. However due to marked tortuosity of the innominate vessel were unable to adequately torque the catheter to achieve access into the right coronary. We then proceeded with femoral access. The right radial catheter was removed and a radial band was placed. The right groin was prepped, draped, and anesthetized with 1% lidocaine. Using modified Seldinger technique, a 5 French sheath was introduced into the right femoral artery. Standard Judkins catheters were used for coronary angiography and left ventriculography. Catheter exchanges were performed over a guidewire. There were no immediate procedural complications. The patient was transferred to the post catheterization recovery area for further monitoring.  Procedural Findings: Hemodynamics:  AO 130/50 with a mean of 80 mmHg LV 139/16 mmHg   Coronary angiography: Coronary dominance: right  Left mainstem: The left main coronary is moderately calcified in the distal vessel. It has 10-20% narrowing distally.  Left anterior descending (LAD): The left anterior descending artery is heavily calcified in the proximal to mid vessel. There is a 60-70% stenosis in the proximal LAD. This is followed by a 95% stenosis in the mid LAD with TIMI 2 flow distally. The  first diagonal is a moderate-sized vessel which is calcified at the ostium with 70-80% ostial stenosis. The second diagonal is a very small branch arising from the segment with 95% stenosis. It has a 90% ostial stenosis.  Left circumflex (LCx): The left circumflex is large vessel is moderately calcified. It gives rise to a large bifurcating marginal branch and then continues in the AV groove. It gives rise to a very small marginal branch on the lateral wall. There is 40% disease in the proximal first OM. The small marginal branch on the lateral wall has a 80-90% stenosis. This vessel is only about a millimeter in diameter.  Right coronary artery (RCA): The right coronary is a dominant vessel. It is moderately calcified in the proximal- mid vessel. In the mid vessel there is a 30% stenosis.  Left ventriculography: Left ventricular systolic function is hyperdynamic, LVEF is estimated at 65-70%, there is no significant mitral regurgitation   Final Conclusions:   1. Critical single vessel obstructive coronary disease involving the LAD. This vessel has complex disease with heavy calcification, long segmental disease, and involvement of diagonal side branches. 2. Hyperdynamic LV function.  Recommendations: We will discuss options for PCI with the patient. I think she would be a suitable candidate for PCI of the LAD but may require rotational atherectomy. We'll start dual antiplatelet therapy. This weekend we will treat her with IV nitroglycerin and heparin and tentatively plan for PCI on Monday. Will check P2Y12 function on Plavix.  Theron Arista [facility] 08/02/2012, 3:38 PM

## 2012-08-02 NOTE — Interval H&P Note (Signed)
History and Physical Interval Note:  08/02/2012 2:30 PM  Jacqueline [name]  has presented today for surgery, with the diagnosis of cp  The various methods of treatment have been discussed with the patient and family. After consideration of risks, benefits and other options for treatment, the patient has consented to  Procedure(s): LEFT HEART CATHETERIZATION WITH CORONARY ANGIOGRAM (N/A) as a surgical intervention .  The patient's history has been reviewed, patient examined, no change in status, stable for surgery.  I have reviewed the patient's chart and labs.  Questions were answered to the patient's satisfaction.     Theron Arista [facility] 08/02/2012 2:30 PM

## 2012-08-02 NOTE — Progress Notes (Signed)
 TELEMETRY: Reviewed telemetry pt in NSR with occ PVCs.: Filed Vitals:   08/01/12 2021 08/02/12 0415 08/02/12 0534 08/02/12 0554  BP: 143/49 189/69 202/84 162/76  Pulse: 66 73 82 90  Temp: 98 F (36.7 C) 98 F (36.7 C)    TempSrc: Oral Oral    Resp:      Height:      Weight:  181 lb (82.101 kg)    SpO2: 96% 97%      Intake/Output Summary (Last 24 hours) at 08/02/12 0738 Last data filed at 08/02/12 0415  Gross per 24 hour  Intake   1170 ml  Output      0 ml  Net   1170 ml    SUBJECTIVE Patient awoke with severe Left arm and upper chest pain at 5:30 am. Describes arm pain as heaviness and she has a difficult time moving it. Similar to pain on admission. Tearful and upset.  LABS: Basic Metabolic Panel:  Recent Labs  07/31/12 0007 07/31/12 0535  NA 138 141  K 3.3* 3.1*  CL 104 108  CO2 24 24  GLUCOSE 111* 99  BUN 21 17  CREATININE 0.60 0.57  CALCIUM 9.3 9.1   CBC:  Recent Labs  07/31/12 0007  08/01/12 0524 08/02/12 0640  WBC 8.3  < > 8.2 9.5  NEUTROABS 6.4  --   --   --   HGB 14.1  < > 14.6 15.4*  HCT 41.5  < > 42.5 43.9  MCV 88.5  < > 88.4 87.8  PLT 144*  < > 146* 164  < > = values in this interval not displayed. Cardiac Enzymes:  Recent Labs  07/31/12 0007 07/31/12 0535 07/31/12 0947  TROPONINI <0.30 <0.30 <0.30   Hemoglobin A1C:  Recent Labs  07/31/12 0535  HGBA1C 5.9*   Radiology/Studies:  Dg Chest 2 View  07/31/2012  *RADIOLOGY REPORT*  Clinical Data: Chest pain.  CHEST - 2 VIEW  Comparison: 12/29/2011  Findings: Emphysematous changes in the lungs.  Scattered fibrosis. Blunting of the right costophrenic angle suggesting fluid or thickened pleura.  No pneumothorax.  Central bronchial wall thickening suggesting chronic bronchitis.  No focal consolidation. Normal heart size and pulmonary vascularity.  Calcification of the aorta.  Degenerative changes in the spine and shoulders.  IMPRESSION: Emphysematous and chronic bronchitic changes in the  lungs with scattered fibrosis.  Fluid or thickened pleura in the right costophrenic angle.   Original Report Authenticated By: William Stevens, M.D.     PHYSICAL EXAM General: Well developed, eldely, anxious. Head: Normocephalic, atraumatic, sclera non-icteric, no xanthomas, nares are without discharge. Neck: Negative for carotid bruits. JVD not elevated. Lungs: Clear bilaterally to auscultation without wheezes, rales, or rhonchi. Breathing is unlabored. Heart: RRR S1 S2 with 2/6 harsh systolic murmur RUSB>>apex. +S4.  Abdomen: Soft, non-tender, non-distended with normoactive bowel sounds. No hepatomegaly. No rebound/guarding. No obvious abdominal masses. Msk:  Strength and tone appears normal for age. Extremities: 1+ edema.  Distal pedal pulses are 2+ and equal bilaterally. Full ROM left shoulder without tenderness. Neuro: Alert and oriented X 3. Moves all extremities spontaneously. Psych:  Responds to questions appropriately with a normal affect.  ASSESSMENT AND PLAN: 1. Unstable angina. Recurrent despite good BP control yesterday. Will cycle cardiac enzymes again and check 12 lead Ecg. Discussed cardiac cath but patient states she does not want this. Procedure described for her. Will give prn IV Mso4. Increase coreg to 25 mg bid and add Imdur 30 mg daily.  2.   Severe HTN- BP much better yesterday with medication adjustments but BP spiked with chest and left arm pain today. Adjustments as noted above. 3. Mild aortic stenosis.  Principal Problem:   Unstable angina Active Problems:   Hypertension   Aortic stenosis   Atypical chest pain    Signed, Clanton Emanuelson MD,FACC 08/02/2012 7:38 AM      

## 2012-08-03 HISTORY — PX: CORONARY ANGIOPLASTY WITH STENT PLACEMENT: SHX49

## 2012-08-03 LAB — BASIC METABOLIC PANEL
BUN: 20 mg/dL (ref 6–23)
Calcium: 9.2 mg/dL (ref 8.4–10.5)
Creatinine, Ser: 0.82 mg/dL (ref 0.50–1.10)
GFR calc non Af Amer: 60 mL/min — ABNORMAL LOW (ref >90)
Glucose, Bld: 102 mg/dL — ABNORMAL HIGH (ref 70–99)
Sodium: 141 mEq/L (ref 135–145)

## 2012-08-03 LAB — CBC
HCT: 41.6 % (ref 36.0–46.0)
Hemoglobin: 14.3 g/dL (ref 12.0–15.0)
MCH: 30.5 pg (ref 26.0–34.0)
MCHC: 34.4 g/dL (ref 30.0–36.0)
MCV: 88.7 fL (ref 78.0–100.0)

## 2012-08-03 LAB — HEPARIN LEVEL (UNFRACTIONATED): Heparin Unfractionated: 0.4 IU/mL (ref 0.30–0.70)

## 2012-08-03 NOTE — Progress Notes (Signed)
ANTICOAGULATION CONSULT NOTE - Follow Up Consult  Pharmacy Consult for Heparin Indication: chest pain/ACS  No Known Allergies  Patient Measurements: Height: 5\' 4"  (162.6 cm) Weight: 188 lb 7.9 oz (85.5 kg) IBW/kg (Calculated) : 54.7 Heparin Dosing Weight: 70 kg  Vital Signs: Temp: 99 F (37.2 C) (03/01 0700) Temp src: Oral (03/01 0700) BP: 148/82 mmHg (03/01 0745) Pulse Rate: 92 (03/01 0426)  Labs:  Recent Labs  07/31/12 1203  08/01/12 0524 08/02/12 0640 08/02/12 1040 08/03/12 0500 08/03/12 0839  HGB  --   < > 14.6 15.4*  --  14.3  --   HCT  --   --  42.5 43.9  --  41.6  --   PLT  --   --  146* 164  --  174  --   LABPROT  --   --   --   --  13.4  --   --   INR  --   --   --   --  1.03  --   --   HEPARINUNFRC 0.18*  --   --   --   --   --  0.40  CREATININE  --   --   --  0.72  --  0.82  --   < > = values in this interval not displayed.  Estimated Creatinine Clearance: 45.3 ml/min (by C-G formula based on Cr of 0.82).   Medications:  Infusions:  . [EXPIRED] sodium chloride 1 mL/kg/hr (08/02/12 1800)  . heparin 1,150 Units/hr (08/03/12 0009)  . nitroGLYCERIN 5 mcg/min (08/03/12 0900)    Assessment: 79 y/o female admitted 07/30/2012 with chest pain. S/P cath, plan for PCI and arthrectomy on Monday to continue heparin over the weekend.   Heparin level at goal,no bleeding noted, CBC stable.   Goal of Therapy:  Heparin level 0.3-0.7 units/ml Monitor platelets by anticoagulation protocol: Yes   Plan:  -Continue heparin 1250 units/hr -Daily heparin level and CBC while on heparin -Monitor for signs/symptoms of bleeding   Thank you for allowing pharmacy to be a part of this patients care team.  Vania Rea. Darin Engels.D. Clinical Pharmacist Pager [phone] Phone [phone] 08/03/2012 11:53 AM

## 2012-08-03 NOTE — Progress Notes (Signed)
   SUBJECTIVE:  No chest or arm pain.  No SOB   PHYSICAL EXAM Filed Vitals:   08/02/12 2330 08/03/12 0000 08/03/12 0200 08/03/12 0426  BP: 136/69   164/60  Pulse: 71 85 71 92  Temp: 98.7 F (37.1 C)   99.1 F (37.3 C)  TempSrc: Oral   Oral  Resp: 20 21 22 19   Height:      Weight:    188 lb 7.9 oz (85.5 kg)  SpO2: 95% 96% 95% 98%   General:  No distress Lungs:  Clear Heart:  RRR Abdomen:  Positive bowel sounds, no rebound no guarding Extremities:  Right wrist and femoral site without hematoma or echymosis.  LABS: Lab Results  Component Value Date   TROPONINI <0.30 07/31/2012   Results for orders placed during the hospital encounter of 07/30/12 (from the past 24 hour(s))  PROTIME-INR     Status: None   Collection Time    08/02/12 10:40 AM      Result Value Range   Prothrombin Time 13.4  11.6 - 15.2 seconds   INR 1.03  0.00 - 1.49  MRSA PCR SCREENING     Status: None   Collection Time    08/02/12  6:16 PM      Result Value Range   MRSA by PCR NEGATIVE  NEGATIVE  CBC     Status: Abnormal   Collection Time    08/03/12  5:00 AM      Result Value Range   WBC 10.8 (*) 4.0 - 10.5 K/uL   RBC 4.69  3.87 - 5.11 MIL/uL   Hemoglobin 14.3  12.0 - 15.0 g/dL   HCT 16.1  09.6 - 04.5 %   MCV 88.7  78.0 - 100.0 fL   MCH 30.5  26.0 - 34.0 pg   MCHC 34.4  30.0 - 36.0 g/dL   RDW 40.9  81.1 - 91.4 %   Platelets 174  150 - 400 K/uL  BASIC METABOLIC PANEL     Status: Abnormal   Collection Time    08/03/12  5:00 AM      Result Value Range   Sodium 141  135 - 145 mEq/L   Potassium 3.8  3.5 - 5.1 mEq/L   Chloride 107  96 - 112 mEq/L   CO2 23  19 - 32 mEq/L   Glucose, Bld 102 (*) 70 - 99 mg/dL   BUN 20  6 - 23 mg/dL   Creatinine, Ser 7.82  0.50 - 1.10 mg/dL   Calcium 9.2  8.4 - 95.6 mg/dL   GFR calc non Af Amer 60 (*) >90 mL/min   GFR calc Af Amer 69 (*) >90 mL/min    Intake/Output Summary (Last 24 hours) at 08/03/12 2130 Last data filed at 08/03/12 0500  Gross per 24 hour    Intake 801.56 ml  Output    850 ml  Net -48.44 ml    ASSESSMENT AND PLAN:  Unstable angina:  Status post cath.  Plan is for PCI of LAD on Monday. Continue heparin and NTG IV  AS:  No further imaging.  Mild  HTN:  BP labile. Continue the current therapy.   Hypothyroidism:  Continue current synthroid.    Rollene Rotunda 08/03/2012 7:33 AM

## 2012-08-04 LAB — CBC
HCT: 38.9 % (ref 36.0–46.0)
MCHC: 33.9 g/dL (ref 30.0–36.0)
Platelets: 138 10*3/uL — ABNORMAL LOW (ref 150–400)
RDW: 14.3 % (ref 11.5–15.5)
WBC: 6.8 10*3/uL (ref 4.0–10.5)

## 2012-08-04 LAB — BASIC METABOLIC PANEL
BUN: 23 mg/dL (ref 6–23)
Chloride: 104 mEq/L (ref 96–112)
GFR calc Af Amer: 71 mL/min — ABNORMAL LOW (ref >90)
GFR calc non Af Amer: 62 mL/min — ABNORMAL LOW (ref >90)
Potassium: 3.8 mEq/L (ref 3.5–5.1)

## 2012-08-04 LAB — HEPARIN LEVEL (UNFRACTIONATED): Heparin Unfractionated: 0.42 IU/mL (ref 0.30–0.70)

## 2012-08-04 MED ORDER — SODIUM CHLORIDE 0.9 % IV SOLN: 1.0000 mL/kg/h | INTRAVENOUS | Status: DC

## 2012-08-04 MED ORDER — SODIUM CHLORIDE 0.9 % IV SOLN
250.0000 mL | INTRAVENOUS | Status: DC | PRN
  Administered 2012-08-05: 10 mL/h via INTRAVENOUS

## 2012-08-04 MED ORDER — SODIUM CHLORIDE 0.9 % IJ SOLN: 3.0000 mL | INTRAMUSCULAR | Status: DC | PRN

## 2012-08-04 MED ORDER — DIAZEPAM 2 MG PO TABS: 2.0000 mg | ORAL_TABLET | ORAL | Status: DC

## 2012-08-04 MED ORDER — SODIUM CHLORIDE 0.9 % IJ SOLN: 3.0000 mL | Freq: Two times a day (BID) | INTRAMUSCULAR | Status: DC

## 2012-08-04 NOTE — Progress Notes (Signed)
    SUBJECTIVE:  No chest or arm pain.  No SOB.  Reading in her chair   PHYSICAL EXAM Filed Vitals:   08/03/12 2225 08/04/12 0000 08/04/12 0410 08/04/12 0722  BP: 117/49  152/48   Pulse: 74  72   Temp: 98.3 F (36.8 C)  98.4 F (36.9 C) 98.4 F (36.9 C)  TempSrc:   Oral Oral  Resp:  22 18 18   Height:      Weight:      SpO2: 91% 93% 97%    General:  No distress Lungs:  Clear Heart:  RRR Abdomen:  Positive bowel sounds, no rebound no guarding Extremities:  Trace edema  LABS: Lab Results  Component Value Date   TROPONINI <0.30 07/31/2012   Results for orders placed during the hospital encounter of 07/30/12 (from the past 24 hour(s))  CBC     Status: Abnormal   Collection Time    08/04/12  5:21 AM      Result Value Range   WBC 6.8  4.0 - 10.5 K/uL   RBC 4.37  3.87 - 5.11 MIL/uL   Hemoglobin 13.2  12.0 - 15.0 g/dL   HCT 40.9  81.1 - 91.4 %   MCV 89.0  78.0 - 100.0 fL   MCH 30.2  26.0 - 34.0 pg   MCHC 33.9  30.0 - 36.0 g/dL   RDW 78.2  95.6 - 21.3 %   Platelets 138 (*) 150 - 400 K/uL  BASIC METABOLIC PANEL     Status: Abnormal   Collection Time    08/04/12  5:21 AM      Result Value Range   Sodium 136  135 - 145 mEq/L   Potassium 3.8  3.5 - 5.1 mEq/L   Chloride 104  96 - 112 mEq/L   CO2 22  19 - 32 mEq/L   Glucose, Bld 98  70 - 99 mg/dL   BUN 23  6 - 23 mg/dL   Creatinine, Ser 0.86  0.50 - 1.10 mg/dL   Calcium 9.0  8.4 - 57.8 mg/dL   GFR calc non Af Amer 62 (*) >90 mL/min   GFR calc Af Amer 71 (*) >90 mL/min  HEPARIN LEVEL (UNFRACTIONATED)     Status: None   Collection Time    08/04/12  5:21 AM      Result Value Range   Heparin Unfractionated 0.42  0.30 - 0.70 IU/mL    Intake/Output Summary (Last 24 hours) at 08/04/12 1039 Last data filed at 08/04/12 0700  Gross per 24 hour  Intake    273 ml  Output      0 ml  Net    273 ml    ASSESSMENT AND PLAN:  Unstable angina:  Status post cath.  Plan is for PCI of LAD on Monday. Continue heparin and NTG  IV.  AS:  No further imaging.  Mild  HTN:  BP labile. Continue the current therapy.    Fayrene Fearing [facility] 08/04/2012 10:39 AM

## 2012-08-04 NOTE — Progress Notes (Signed)
ANTICOAGULATION CONSULT NOTE - Follow Up Consult  Pharmacy Consult for heparin Indication: chest pain/ACS  No Known Allergies  Patient Measurements: Height: 5\' 4"  (162.6 cm) Weight: 188 lb 7.9 oz (85.5 kg) IBW/kg (Calculated) : 54.7  Vital Signs: Temp: 98.4 F (36.9 C) (03/02 0722) Temp src: Oral (03/02 0722) BP: 152/48 mmHg (03/02 0410) Pulse Rate: 72 (03/02 0410)  Labs:  Recent Labs  08/02/12 0640 08/02/12 1040 08/03/12 0500 08/03/12 0839 08/04/12 0521  HGB 15.4*  --  14.3  --  13.2  HCT 43.9  --  41.6  --  38.9  PLT 164  --  174  --  138*  LABPROT  --  13.4  --   --   --   INR  --  1.03  --   --   --   HEPARINUNFRC  --   --   --  0.40 0.42  CREATININE 0.72  --  0.82  --  0.80    Estimated Creatinine Clearance: 46.5 ml/min (by C-G formula based on Cr of 0.8).   Medications:  Scheduled:  . amLODipine  10 mg Oral Daily  . aspirin  325 mg Oral Daily  . atorvastatin  20 mg Oral q1800  . bisacodyl  10 mg Rectal Once  . carvedilol  25 mg Oral BID WC  . clopidogrel  75 mg Oral Q breakfast  . docusate sodium  100 mg Oral Daily  . irbesartan  300 mg Oral Daily  . levothyroxine  125 mcg Oral Daily  . potassium chloride SA  20 mEq Oral Daily  . sodium chloride  3 mL Intravenous Q12H    Assessment: 79 y/o female admitted with chest pain S/P cath, plan for PCI and arthrectomy on Monday. To continue on heparin drip over the weekend. HL remains therapeutic 0.42, slight drop in PLT's no noted bleeding.  Goal of Therapy:  Heparin level 0.3-0.7 units/ml Monitor platelets by anticoagulation protocol: Yes   Plan:  1. Continue Heparin 1150 units/hr 2. Follow up Heparin level AM  3. Monitor s/sx of bleeding  Bola A. Wandra Feinstein D Clinical Pharmacist Pager:[phone] Phone [phone] 08/04/2012 10:27 AM

## 2012-08-05 ENCOUNTER — Encounter (HOSPITAL_COMMUNITY)
Admission: EM | Disposition: A | Discharge status: Nursing Home (Includes ICF) | Payer: Self-pay | Source: Home / Self Care | Attending: Cardiology

## 2012-08-05 ENCOUNTER — Ambulatory Visit (HOSPITAL_COMMUNITY): Admit: 2012-08-05 | Payer: Medicare Other | Admitting: Cardiology

## 2012-08-05 DIAGNOSIS — I2510 Atherosclerotic heart disease of native coronary artery without angina pectoris: Secondary | ICD-10-CM

## 2012-08-05 HISTORY — PX: PERCUTANEOUS CORONARY ROTOBLATOR INTERVENTION (PCI-R): SHX5484

## 2012-08-05 LAB — CBC
MCH: 29.8 pg (ref 26.0–34.0)
MCV: 88.8 fL (ref 78.0–100.0)
Platelets: 145 10*3/uL — ABNORMAL LOW (ref 150–400)
RBC: 4.39 MIL/uL (ref 3.87–5.11)
RDW: 14.1 % (ref 11.5–15.5)
WBC: 7.5 10*3/uL (ref 4.0–10.5)

## 2012-08-05 LAB — POCT ACTIVATED CLOTTING TIME: Activated Clotting Time: 590 seconds

## 2012-08-05 LAB — GLUCOSE, CAPILLARY: Glucose-Capillary: 95 mg/dL (ref 70–99)

## 2012-08-05 LAB — HEPARIN LEVEL (UNFRACTIONATED): Heparin Unfractionated: 0.36 IU/mL (ref 0.30–0.70)

## 2012-08-05 SURGERY — PERCUTANEOUS CORONARY ROTOBLATOR INTERVENTION (PCI-R)
Anesthesia: LOCAL

## 2012-08-05 MED ORDER — SODIUM CHLORIDE 0.9 % IV SOLN: 1.0000 mL/kg/h | INTRAVENOUS | Status: AC

## 2012-08-05 MED ORDER — BIVALIRUDIN 250 MG IV SOLR
INTRAVENOUS | Status: AC
  Filled 2012-08-05: qty 250

## 2012-08-05 MED ORDER — NITROGLYCERIN IN D5W 200-5 MCG/ML-% IV SOLN
INTRAVENOUS | Status: AC
  Filled 2012-08-05: qty 250

## 2012-08-05 MED ORDER — HEPARIN (PORCINE) IN NACL 2-0.9 UNIT/ML-% IJ SOLN
INTRAMUSCULAR | Status: AC
  Filled 2012-08-05: qty 1000

## 2012-08-05 MED ORDER — LIDOCAINE HCL (PF) 1 % IJ SOLN
INTRAMUSCULAR | Status: AC
  Filled 2012-08-05: qty 30

## 2012-08-05 NOTE — Progress Notes (Signed)
Physical Therapy Treatment Patient Details Name: Jacqueline [name] MRN: [id number] DOB: 15-Dec-1916 Today's Date: 08/05/2012 Time:13:57     Visit Information  Last PT Received On: 08/05/12 Assistance Needed: +1 Reason Eval/Treat Not Completed: Patient at procedure or test/unavailable. Pt on her way to cath lab at this time. Will follow up a later date this week.     GP     Sallyanne Kuster 08/05/2012, 1:56 PM  Sallyanne Kuster, PTA Office- [phone]

## 2012-08-05 NOTE — Progress Notes (Signed)
Site area: right groin  Site Prior to Removal:  Level 1  Pressure Applied For 25 MINUTES    Minutes Beginning at 1745  Manual:   yes  Patient Status During Pull:  AAO X 4  Post Pull Groin Site:  Level 1  Post Pull Instructions Given:  yes  Post Pull Pulses Present:  yes  Dressing Applied:  yes  Comments:  Tolerate procedure well

## 2012-08-05 NOTE — Progress Notes (Signed)
Pt was transferred to unit 2900, and to [employer], LCSW-A. Report given for CSW for continuance of care.  Sherald Barge, LCSW-A Clinical Social Worker [phone]

## 2012-08-05 NOTE — H&P (View-Only) (Signed)
    SUBJECTIVE:  No chest or arm pain.  No SOB.  Reading in her chair   PHYSICAL EXAM Filed Vitals:   08/03/12 2225 08/04/12 0000 08/04/12 0410 08/04/12 0722  BP: 117/49  152/48   Pulse: 74  72   Temp: 98.3 F (36.8 C)  98.4 F (36.9 C) 98.4 F (36.9 C)  TempSrc:   Oral Oral  Resp:  22 18 18  Height:      Weight:      SpO2: 91% 93% 97%    General:  No distress Lungs:  Clear Heart:  RRR Abdomen:  Positive bowel sounds, no rebound no guarding Extremities:  Trace edema  LABS: Lab Results  Component Value Date   TROPONINI <0.30 07/31/2012   Results for orders placed during the hospital encounter of 07/30/12 (from the past 24 hour(s))  CBC     Status: Abnormal   Collection Time    08/04/12  5:21 AM      Result Value Range   WBC 6.8  4.0 - 10.5 K/uL   RBC 4.37  3.87 - 5.11 MIL/uL   Hemoglobin 13.2  12.0 - 15.0 g/dL   HCT 38.9  36.0 - 46.0 %   MCV 89.0  78.0 - 100.0 fL   MCH 30.2  26.0 - 34.0 pg   MCHC 33.9  30.0 - 36.0 g/dL   RDW 14.3  11.5 - 15.5 %   Platelets 138 (*) 150 - 400 K/uL  BASIC METABOLIC PANEL     Status: Abnormal   Collection Time    08/04/12  5:21 AM      Result Value Range   Sodium 136  135 - 145 mEq/L   Potassium 3.8  3.5 - 5.1 mEq/L   Chloride 104  96 - 112 mEq/L   CO2 22  19 - 32 mEq/L   Glucose, Bld 98  70 - 99 mg/dL   BUN 23  6 - 23 mg/dL   Creatinine, Ser 0.80  0.50 - 1.10 mg/dL   Calcium 9.0  8.4 - 10.5 mg/dL   GFR calc non Af Amer 62 (*) >90 mL/min   GFR calc Af Amer 71 (*) >90 mL/min  HEPARIN LEVEL (UNFRACTIONATED)     Status: None   Collection Time    08/04/12  5:21 AM      Result Value Range   Heparin Unfractionated 0.42  0.30 - 0.70 IU/mL    Intake/Output Summary (Last 24 hours) at 08/04/12 1039 Last data filed at 08/04/12 0700  Gross per 24 hour  Intake    273 ml  Output      0 ml  Net    273 ml    ASSESSMENT AND PLAN:  Unstable angina:  Status post cath.  Plan is for PCI of LAD on Monday. Continue heparin and NTG  IV.  AS:  No further imaging.  Mild  HTN:  BP labile. Continue the current therapy.    Shimika Ames 08/04/2012 10:39 AM   

## 2012-08-05 NOTE — Progress Notes (Signed)
ANTICOAGULATION CONSULT NOTE - Follow Up Consult  Pharmacy Consult for heparin Indication: chest pain/ACS  No Known Allergies  Patient Measurements: Height: 5\' 4"  (162.6 cm) Weight: 191 lb 9.3 oz (86.9 kg) IBW/kg (Calculated) : 54.7  Vital Signs: Temp: 98.4 F (36.9 C) (03/03 0730) Temp src: Oral (03/03 0730) BP: 139/51 mmHg (03/03 0800) Pulse Rate: 66 (03/03 0800)  Labs:  Recent Labs  08/02/12 1040  08/03/12 0500 08/03/12 0839 08/04/12 0521 08/05/12 0645  HGB  --   < > 14.3  --  13.2 13.1  HCT  --   --  41.6  --  38.9 39.0  PLT  --   --  174  --  138* 145*  LABPROT 13.4  --   --   --   --   --   INR 1.03  --   --   --   --   --   HEPARINUNFRC  --   --   --  0.40 0.42 0.36  CREATININE  --   --  0.82  --  0.80  --   < > = values in this interval not displayed.  Estimated Creatinine Clearance: 46.9 ml/min (by C-G formula based on Cr of 0.8).   Medications:  Scheduled:  . amLODipine  10 mg Oral Daily  . aspirin  325 mg Oral Daily  . atorvastatin  20 mg Oral q1800  . bisacodyl  10 mg Rectal Once  . carvedilol  25 mg Oral BID WC  . clopidogrel  75 mg Oral Q breakfast  . diazepam  2 mg Oral On Call  . docusate sodium  100 mg Oral Daily  . irbesartan  300 mg Oral Daily  . levothyroxine  125 mcg Oral Daily  . potassium chloride SA  20 mEq Oral Daily  . sodium chloride  3 mL Intravenous Q12H  . [DISCONTINUED] sodium chloride  3 mL Intravenous Q12H    Assessment: 79 y/o female admitted with chest pain S/P cath, plan for PCI and arthrectomy today 08/05/2012. HL remains therapeutic 0.36, PLT's stable no noted bleeding. Will follow after cath.  Goal of Therapy:  Heparin level 0.3-0.7 units/ml Monitor platelets by anticoagulation protocol: Yes   Plan:  1. Continue Heparin 1150 units/hr 2. Follow up after cath 3. Monitor s/sx of bleeding  Sheppard Coil PharmD Clinical Pharmacist [phone] Phone [phone] 08/05/2012 10:39 AM

## 2012-08-05 NOTE — CV Procedure (Signed)
   CARDIAC CATH NOTE  Name: Jacqueline [name] MRN: [id number] DOB: [birth year]  Procedure: PTCA/rotational atherectomy and stenting of the LAD  Indication: 79 year old white female with history of severe hypertension presents with unstable angina. She continued to have resting angina despite optimal medical therapy. Diagnostic cardiac catheterization demonstrated a 70% proximal LAD lesion followed by 95% stenosis after the first diagonal. The LAD was heavily calcified.  Procedural Details: The right groin was prepped, draped, and anesthetized with 1% lidocaine. Using the modified Seldinger technique, a 7 Fr sheath was introduced into the right femoral artery.  Weight-based bivalirudin was given for anticoagulation. Once a therapeutic ACT was achieved, a 7 [place] XB LAD 3.5 guide catheter was inserted.  A Rotafloppy coronary guidewire was used to cross the lesion. The lesion was then treated with rotational atherectomy with a 1.5 mm burr. This was followed by treatment with a 1.75 mm burr.   The lesion was then stented with a 2.5 x 32 mm Promus premier stent.  The stent was postdilated with a 2.75 mm noncompliant balloon.  Following PCI, there was 0% residual stenosis and TIMI-3 flow. Final angiography confirmed an excellent result. The patient tolerated the procedure well. There were no immediate procedural complications. Femoral hemostasis was achieved with manual compression. The patient was transferred to the post catheterization recovery area for further monitoring.  Lesion Data: Vessel: LAD Percent stenosis (pre): 95% TIMI-flow (pre):  2 Stent:  3.0 x 32 mm Promus premier Percent stenosis (post): 0% TIMI-flow (post): 3  Conclusions: Successful stenting of the proximal to mid LAD following rotational atherectomy.  Recommendations: Continue dual antiplatelet therapy for one year.  Theron Arista [facility] 08/05/2012, 3:26 PM

## 2012-08-05 NOTE — Progress Notes (Signed)
Assumed care at this time. Pt resting comfortably in bed. Family at bedside. Waiting to go to cath lab. Pt remains NPO. Clarified with Dr [place] to not give valium for heart cath. PT up frequently with assistance to [facility]. Denies pain or discomfort.

## 2012-08-05 NOTE — Interval H&P Note (Signed)
History and Physical Interval Note:  08/05/2012 2:25 PM  Jacqueline [name]  has presented today for surgery, with the diagnosis of CAD  The various methods of treatment have been discussed with the patient and family. After consideration of risks, benefits and other options for treatment, the patient has consented to  Procedure(s): PERCUTANEOUS CORONARY ROTOBLATOR INTERVENTION (PCI-R) (N/A) as a surgical intervention .  The patient's history has been reviewed, patient examined, no change in status, stable for surgery.  I have reviewed the patient's chart and labs.  Questions were answered to the patient's satisfaction.     Peter [place] MD,FACC 08/05/2012 2:25 PM

## 2012-08-06 ENCOUNTER — Encounter (HOSPITAL_COMMUNITY): Payer: Self-pay | Admitting: Nurse Practitioner

## 2012-08-06 DIAGNOSIS — I350 Nonrheumatic aortic (valve) stenosis: Secondary | ICD-10-CM

## 2012-08-06 DIAGNOSIS — I2510 Atherosclerotic heart disease of native coronary artery without angina pectoris: Secondary | ICD-10-CM | POA: Diagnosis present

## 2012-08-06 LAB — GLUCOSE, CAPILLARY
Glucose-Capillary: 98 mg/dL (ref 70–99)
Glucose-Capillary: 144 mg/dL — ABNORMAL HIGH (ref 70–99)

## 2012-08-06 LAB — BASIC METABOLIC PANEL
CO2: 23 mEq/L (ref 19–32)
Calcium: 8.8 mg/dL (ref 8.4–10.5)
Creatinine, Ser: 0.73 mg/dL (ref 0.50–1.10)
GFR calc non Af Amer: 71 mL/min — ABNORMAL LOW (ref >90)
Sodium: 138 mEq/L (ref 135–145)

## 2012-08-06 LAB — CBC
MCH: 30.2 pg (ref 26.0–34.0)
MCHC: 33.9 g/dL (ref 30.0–36.0)
MCV: 89.2 fL (ref 78.0–100.0)
Platelets: 151 10*3/uL (ref 150–400)
RBC: 4.34 MIL/uL (ref 3.87–5.11)

## 2012-08-06 MED ORDER — NITROGLYCERIN 0.4 MG SL SUBL: 0.4000 mg | SUBLINGUAL_TABLET | SUBLINGUAL | Status: DC | PRN

## 2012-08-06 MED ORDER — ATORVASTATIN CALCIUM 20 MG PO TABS: 20.0000 mg | ORAL_TABLET | Freq: Every day | ORAL | Status: DC

## 2012-08-06 MED ORDER — ASPIRIN 81 MG PO TABS: 81.0000 mg | ORAL_TABLET | Freq: Every day | ORAL | Status: DC

## 2012-08-06 MED ORDER — ASPIRIN 81 MG PO CHEW
81.0000 mg | CHEWABLE_TABLET | Freq: Every day | ORAL | Status: DC
  Administered 2012-08-06: 81 mg via ORAL
  Filled 2012-08-06: qty 1

## 2012-08-06 MED ORDER — CARVEDILOL 25 MG PO TABS: 25.0000 mg | ORAL_TABLET | Freq: Two times a day (BID) | ORAL | Status: DC

## 2012-08-06 MED ORDER — AMLODIPINE BESYLATE 10 MG PO TABS: 10.0000 mg | ORAL_TABLET | Freq: Every day | ORAL | Status: DC

## 2012-08-06 MED ORDER — CLOPIDOGREL BISULFATE 75 MG PO TABS: 75.0000 mg | ORAL_TABLET | Freq: Every day | ORAL | Status: DC

## 2012-08-06 MED FILL — Dextrose Inj 5%: INTRAVENOUS | Qty: 50 | Status: AC

## 2012-08-06 NOTE — Progress Notes (Signed)
Physical Therapy Treatment Patient Details Name: Jacqueline [name] MRN: [id number] DOB: 06/10/1916 Today's Date: 08/06/2012 Time: 7829-5621 PT Time Calculation (min): 26 min  PT Assessment / Plan / Recommendation Comments on Treatment Session  Patient progressing with activity tolerance and balance as well as safety awareness.  Plans either to ALF portion of retirement community she lived in prior versus to skilled care.  Do continue to recommend supervision/assist for mobility/OOB activity.      Follow Up Recommendations  Home health PT;Supervision for mobility/OOB;SNF (depending on availability at her retirement center)     Does the patient have the potential to tolerate intense rehabilitation   N/A  Barriers to Discharge  None      Equipment Recommendations  None recommended by PT    Recommendations for Other Services  None  Frequency Min 3X/week   Plan Discharge plan needs to be updated    Precautions / Restrictions Precautions Precautions: Fall   Pertinent Vitals/Pain Denies pain; HR 77 with ambulation    Mobility  Bed Mobility Bed Mobility: Not assessed Details for Bed Mobility Assistance: up in chair Transfers Sit to Stand: 4: Min guard;Without upper extremity assist;From chair/3-in-1 Stand to Sit: 4: Min guard;To chair/3-in-1;With upper extremity assist Details for Transfer Assistance: cues for hand placement to sit; increased time to come to standing and attempted initially without assist, needed hand on her to come up all the way Ambulation/Gait Ambulation/Gait Assistance: 4: Min guard Ambulation Distance (Feet): 200 Feet Assistive device: Rolling walker Ambulation/Gait Assistance Details: slow steady pace with kyphotic posture and decreased step length and height. Gait Pattern: Step-through pattern;Decreased stride length;Decreased hip/knee flexion - left;Decreased hip/knee flexion - right    Exercises General Exercises - Lower Extremity Long Arc Quad:  AROM;Both;Seated;10 reps Hip Flexion/Marching: AROM;Seated;Both;10 reps Toe Raises: AROM;Seated;Both;10 reps Heel Raises: AROM;Seated;Both;10 reps Other Exercises Other Exercises: trunk flexion and controlled extension in chair arms crossed over chest x 10 reps    PT Goals Acute Rehab PT Goals Pt will go Sit to Stand: with modified independence PT Goal: Sit to Stand - Progress: Progressing toward goal Pt will go Stand to Sit: with modified independence PT Goal: Stand to Sit - Progress: Progressing toward goal Pt will Ambulate: 16 - 50 feet;with modified independence;with rolling walker PT Goal: Ambulate - Progress: Progressing toward goal  Visit Information  Last PT Received On: 08/06/12    Subjective Data  Subjective: I am supposed to go home today.  Planning to ALF or to healthcare section first.   Cognition  Cognition Overall Cognitive Status: Appears within functional limits for tasks assessed/performed Arousal/Alertness: Awake/alert Orientation Level: Appears intact for tasks assessed Behavior During Session: [facility] for tasks performed Safety/Judgement - Other Comments: able to state she really wanted to go back to indep living, but knew she just couldn't    Balance  Static Standing Balance Static Standing - Balance Support: Bilateral upper extremity supported Static Standing - Level of Assistance: 5: Stand by assistance Static Standing - Comment/# of Minutes: stood prior to walking with hands on walker and supervision  End of Session PT - End of Session Equipment Utilized During Treatment: Gait belt Activity Tolerance: Patient tolerated treatment well Patient left: in chair;with call bell/phone within reach Nurse Communication: Mobility status   GP     [facility] 08/06/2012, 11:53 AM Sheran Lawless, PT [phone] 08/06/2012

## 2012-08-06 NOTE — Progress Notes (Signed)
   TELEMETRY: Reviewed telemetry pt in NSR with occ PVC: Filed Vitals:   08/05/12 1900 08/05/12 2039 08/05/12 2348 08/06/12 0338  BP: 160/73 120/49 135/46 156/57  Pulse:  70 75 79  Temp:  97.6 F (36.4 C) 98.7 F (37.1 C) 98.8 F (37.1 C)  TempSrc:  Oral Oral Oral  Resp:      Height:      Weight:    192 lb 3.9 oz (87.2 kg)  SpO2:  94% 97% 98%    Intake/Output Summary (Last 24 hours) at 08/06/12 0730 Last data filed at 08/06/12 0600  Gross per 24 hour  Intake 1703.45 ml  Output   2025 ml  Net -321.55 ml    SUBJECTIVE Feels well. No chest pain or SOB.  LABS: Basic Metabolic Panel:  Recent Labs  40/98/11 0521 08/06/12 0445  NA 136 138  K 3.8 3.6  CL 104 106  CO2 22 23  GLUCOSE 98 95  BUN 23 21  CREATININE 0.80 0.73  CALCIUM 9.0 8.8   CBC:  Recent Labs  08/05/12 0645 08/06/12 0445  WBC 7.5 7.3  HGB 13.1 13.1  HCT 39.0 38.7  MCV 88.8 89.2  PLT 145* 151   Radiology/Studies:  Dg Chest 2 View  07/31/2012  *RADIOLOGY REPORT*  Clinical Data: Chest pain.  CHEST - 2 VIEW  Comparison: 12/29/2011  Findings: Emphysematous changes in the lungs.  Scattered fibrosis. Blunting of the right costophrenic angle suggesting fluid or thickened pleura.  No pneumothorax.  Central bronchial wall thickening suggesting chronic bronchitis.  No focal consolidation. Normal heart size and pulmonary vascularity.  Calcification of the aorta.  Degenerative changes in the spine and shoulders.  IMPRESSION: Emphysematous and chronic bronchitic changes in the lungs with scattered fibrosis.  Fluid or thickened pleura in the right costophrenic angle.   Original Report Authenticated By: Burman Nieves, M.D.    Ecg: NSR with PVCs. No acute ST-T changes.  PHYSICAL EXAM General: Well developed, well nourished, in no acute distress. Head: Normocephalic, atraumatic, sclera non-icteric, no xanthomas, nares are without discharge. Neck: Negative for carotid bruits. JVD not elevated. Lungs: Clear  bilaterally to auscultation without wheezes, rales, or rhonchi. Breathing is unlabored. Heart: RRR S1 S2 with 2/6 systolic murmur RUSB. Abdomen: Soft, non-tender, non-distended with normoactive bowel sounds. No hepatomegaly. No rebound/guarding. No obvious abdominal masses. Msk:  Strength and tone appears normal for age. Extremities: No clubbing, cyanosis or edema.  Distal pedal pulses are 2+ and equal bilaterally. Right groin with some bruising, slight swelling. Non tender. Neuro: Alert and oriented X 3. Moves all extremities spontaneously. Psych:  Responds to questions appropriately with a normal affect.  ASSESSMENT AND PLAN: 1. USAP. S/p rotational atherectomy and stenting of the LAD with DES. Continue ASA 81 mg daily and plavix 75 mg daily. P2Y12 137 consistent with good platelet inhibition. OK for disharge today. Follow up in 2 weeks. 2. HTN controlled on current meds. Lasix on hold for now.  3. Deconditioning. Patient plans to go to nursing unit at her facility for continued Rehab therapy. 4. Mild aortic stenosis.  Principal Problem:   Unstable angina Active Problems:   Hypertension   Aortic stenosis   Atypical chest pain    Signed, Peter Swaziland MD,FACC 08/06/2012 7:35 AM

## 2012-08-06 NOTE — Discharge Summary (Signed)
Patient ID: Jacqueline [name],  MRN: [id number], DOB/AGE: 1916/10/18 79 y.o.  Admit date: 07/30/2012 Discharge date: 08/06/2012  Primary Care Provider: Warrick Parisian Primary Cardiologist: P. Swaziland, MD  Discharge Diagnoses Principal Problem:   Unstable angina  **S/P Cath revealing severe LAD dzs which was subsequently treated with rotational atherectomy and DES during this admission. Active Problems:   Hypertension   CAD (coronary artery disease)   Mild aortic stenosis   Hypothyroidism  Allergies No Known Allergies  Procedures  Cardiac Catheterization 08/02/2012 & Percutaneous Coronary Intervention 08/05/2012  Procedural Findings: Hemodynamics:  AO 130/50 with a mean of 80 mmHg LV 139/16 mmHg              Coronary angiography: Coronary dominance: right  Left mainstem: The left main coronary is moderately calcified in the distal vessel. It has 10-20% narrowing distally. Left anterior descending (LAD): The left anterior descending artery is heavily calcified in the proximal to mid vessel. There is a 60-70% stenosis in the proximal LAD. This is followed by a 95% stenosis in the mid LAD with TIMI 2 flow distally. The first diagonal is a moderate-sized vessel which is calcified at the ostium with 70-80% ostial stenosis. The second diagonal is a very small branch arising from the segment with 95% stenosis. It has a 90% ostial stenosis.   **The LAD was successfully treated with rotational atherectomy and placement of a 2.5 x 32 mm Promus Premier DES (dilated up to 2.75 mm) on August 05, 2012.  Left circumflex (LCx): The left circumflex is large vessel is moderately calcified. It gives rise to a large bifurcating marginal branch and then continues in the AV groove. It gives rise to a very small marginal branch on the lateral wall. There is 40% disease in the proximal first OM. The small marginal branch on the lateral wall has a 80-90% stenosis. This vessel is only about a millimeter in  diameter. Right coronary artery (RCA): The right coronary is a dominant vessel. It is moderately calcified in the proximal- mid vessel. In the mid vessel there is a 30% stenosis. Left ventriculography: Left ventricular systolic function is hyperdynamic, LVEF is estimated at 65-70%, there is no significant mitral regurgitation  _____________  History of Present Illness  79 year old female without prior history of coronary artery disease who was in her usual state of health until February 25, when she developed sudden onset of chest discomfort with radiation to the left arm. EMS was called and patient was taken to the [facility] where she was found to be hypertensive with a blood pressure of 218/108.  Chest pain improved with blood pressure control and patient was admitted for further evaluation.  Hospital Course  Patient ruled out for myocardial infarction. Cardiology was consulted and recommendation was made for diagnostic catheterization however initially, patient preferred a more conservative approach. Unfortunately, she had recurrent chest discomfort in the setting of elevated blood pressures and on February 28, decision was made to pursue diagnostic catheterization. This was performed and revealed severe mid LAD disease with otherwise small vessel and nonobstructive disease. LV function was normal. Films were reviewed and given the calcified nature of her LAD disease, it was felt that she would require rotational atherectomy and stenting. She was placed on dual antiplatelet therapy as well as intravenous nitroglycerin and heparin and had no recurrence of discomfort over the weekend. P2Y12 testing was performed and returned at 137, consistent with good platelet inhibition. Antihypertensive medications were added and subsequently titrated with marked improvement in  blood pressure control.   She was taken back to the cardiac catheterization laboratory on March 3, and underwent successful rotational  atherectomy and subsequent gurgling stent placement within the mid LAD. She tolerated this procedure well and has had no recurrence of chest discomfort. She will be discharged today to assisted living at [facility]  Discharge Vitals Blood pressure 128/75, pulse 68, temperature 97.8 F (36.6 C), temperature source Oral, resp. rate 18, height 5\' 4"  (1.626 m), weight 192 lb 3.9 oz (87.2 kg), SpO2 95.00%.  Filed Weights   08/03/12 0426 08/05/12 0549 08/06/12 0338  Weight: 188 lb 7.9 oz (85.5 kg) 191 lb 9.3 oz (86.9 kg) 192 lb 3.9 oz (87.2 kg)    Labs  CBC  Recent Labs  08/05/12 0645 08/06/12 0445  WBC 7.5 7.3  HGB 13.1 13.1  HCT 39.0 38.7  MCV 88.8 89.2  PLT 145* 151   Basic Metabolic Panel  Recent Labs  08/04/12 0521 08/06/12 0445  NA 136 138  K 3.8 3.6  CL 104 106  CO2 22 23  GLUCOSE 98 95  BUN 23 21  CREATININE 0.80 0.73  CALCIUM 9.0 8.8   Cardiac Enzymes Lab Results  Component Value Date   TROPONINI <0.30 07/31/2012   Disposition  Pt is being discharged home today in good condition.  Follow-up Plans & Appointments      Follow-up Information   Follow up with Norma Fredrickson, NP On 08/20/2012. (2:00 PM)    Contact information:   [address] [place] [address] [phone]      Discharge Medications    Medication List    STOP taking these medications       atenolol 50 MG tablet  Commonly known as:  TENORMIN     potassium chloride SA 20 MEQ tablet  Commonly known as:  K-DUR,KLOR-CON      TAKE these medications       amLODipine 10 MG tablet  Commonly known as:  NORVASC  Take 1 tablet (10 mg total) by mouth daily.     aspirin 81 MG tablet  Take 1 tablet (81 mg total) by mouth daily.     atorvastatin 20 MG tablet  Commonly known as:  LIPITOR  Take 1 tablet (20 mg total) by mouth daily at 6 PM.     carvedilol 25 MG tablet  Commonly known as:  COREG  Take 1 tablet (25 mg total) by mouth 2 (two) times daily with a  meal.     clopidogrel 75 MG tablet  Commonly known as:  PLAVIX  Take 1 tablet (75 mg total) by mouth daily with breakfast.     furosemide 40 MG tablet  Commonly known as:  LASIX  Take 40 mg by mouth daily as needed (for swelling).     levothyroxine 125 MCG tablet  Commonly known as:  SYNTHROID, LEVOTHROID  Take 125 mcg by mouth daily.     nitroGLYCERIN 0.4 MG SL tablet  Commonly known as:  NITROSTAT  Place 1 tablet (0.4 mg total) under the tongue every 5 (five) minutes as needed for chest pain.     olmesartan 40 MG tablet  Commonly known as:  BENICAR  Take 40 mg by mouth daily.      Outstanding Labs/Studies  Lipids/lft's in 8 weeks.  Duration of Discharge Encounter   Greater than 30 minutes including physician time.  Signed, Nicolasa Ducking NP 08/06/2012, 10:57 AM

## 2012-08-06 NOTE — Progress Notes (Signed)
CARDIAC REHAB PHASE I   PRE:  Rate/Rhythm: 65SR  BP:  Supine:   Sitting: 141/54  Standing:    SaO2: 96%RA  MODE:  Ambulation: 108 ft   POST:  Rate/Rhythem: 88SR  BP:  Supine:   Sitting: 162/56  Standing:    SaO2: 96%RA 0740-0832 Pt walked 108 ft with gait belt and rolling walker with slow,steady gait. Needs asst x 1. Pt states that her walker is 77 years old and does not fold up. Would recommend new walker for home. Pt lives in Independent and wants to go to Rehab at her facility. Denied arm or CP. Tolerated well for short walk. BP elevated. Reviewed NTG use and stent/plavix. Pt eats at dining  room and uses scooter to get there. Did not give ex ed or discuss CRP 2 as not appropriate. Discussed not using salt but no other diet information given as she eats in dining hall.  To recliner after walk with call bell. PT to see also.  Duanne Limerick

## 2012-08-06 NOTE — Discharge Summary (Signed)
Patient seen and examined and history reviewed. Agree with above findings and plan. See my earlier rounding note.  Ronnisha Felber JordanMD 08/06/2012 11:26 AM    

## 2012-08-06 NOTE — Clinical Social Work Placement (Addendum)
Clinical Social Work Department CLINICAL SOCIAL WORK PLACEMENT NOTE 08/06/2012  Patient:  Jacqueline [name], Jacqueline [name]  Account Number:  [id number] Admit date:  07/30/2012  Clinical Social Worker:  Genelle Bal, LCSW  Date/time:  08/06/2012 01:20 AM  Clinical Social Work is seeking post-discharge placement for this patient at the following level of care:   SKILLED NURSING   (*CSW will update this form in Epic as items are completed)     Patient/family provided with [facility] Department of Clinical Social Work's list of facilities offering this level of care within the geographic area requested by the patient (or if unable, by the patient's family).  08/06/2012  Patient/family informed of their freedom to choose among providers that offer the needed level of care, that participate in Medicare, Medicaid or managed care program needed by the patient, have an available bed and are willing to accept the patient.    Patient/family informed of [facility]' ownership interest in [facility], as well as of the fact that they are under no obligation to receive care at this facility.  PASARR submitted to EDS on 08/06/2012 PASARR number received from EDS on 08/06/2012  FL2 transmitted to all facilities in geographic area requested by pt/family on  08/06/2012 FL2 transmitted to all facilities within larger geographic area on   Patient informed that his/her managed care company has contracts with or will negotiate with  certain facilities, including the following:     Patient/family informed of bed offers received:  08/06/2012 Patient chooses bed at [facility], [facility] Physician recommends and patient chooses bed at    Patient to be transferred to [facility] on  08/06/2012 Patient to be transferred to facility by a friend via vehicle.  The following physician request were entered in Epic:   Additional Comments: Patient from [facility]  and going to [facility] for short-term rehab.

## 2012-08-20 ENCOUNTER — Ambulatory Visit (INDEPENDENT_AMBULATORY_CARE_PROVIDER_SITE_OTHER): Payer: Medicare Other | Admitting: Nurse Practitioner

## 2012-08-20 ENCOUNTER — Encounter: Payer: Self-pay | Admitting: Nurse Practitioner

## 2012-08-20 VITALS — BP 120/64 | HR 60 | Ht 64.0 in | Wt 200.0 lb

## 2012-08-20 DIAGNOSIS — I259 Chronic ischemic heart disease, unspecified: Secondary | ICD-10-CM

## 2012-08-20 LAB — CBC WITH DIFFERENTIAL/PLATELET
Basophils Absolute: 0 10*3/uL (ref 0.0–0.1)
Basophils Relative: 0.5 % (ref 0.0–3.0)
Eosinophils Absolute: 0.2 10*3/uL (ref 0.0–0.7)
Eosinophils Relative: 3 % (ref 0.0–5.0)
HCT: 37.5 % (ref 36.0–46.0)
Hemoglobin: 12.7 g/dL (ref 12.0–15.0)
Lymphocytes Relative: 13.9 % (ref 12.0–46.0)
Lymphs Abs: 1.1 10*3/uL (ref 0.7–4.0)
MCHC: 33.8 g/dL (ref 30.0–36.0)
MCV: 89.6 fl (ref 78.0–100.0)
Monocytes Absolute: 0.5 10*3/uL (ref 0.1–1.0)
Monocytes Relative: 7 % (ref 3.0–12.0)
Neutro Abs: 5.8 10*3/uL (ref 1.4–7.7)
Neutrophils Relative %: 75.6 % (ref 43.0–77.0)
Platelets: 178 10*3/uL (ref 150.0–400.0)
RBC: 4.19 Mil/uL (ref 3.87–5.11)
RDW: 14.8 % — ABNORMAL HIGH (ref 11.5–14.6)
WBC: 7.6 10*3/uL (ref 4.5–10.5)

## 2012-08-20 LAB — BASIC METABOLIC PANEL
BUN: 21 mg/dL (ref 6–23)
CO2: 23 mEq/L (ref 19–32)
Calcium: 8.9 mg/dL (ref 8.4–10.5)
Chloride: 106 mEq/L (ref 96–112)
Creatinine, Ser: 0.9 mg/dL (ref 0.4–1.2)
GFR: 58.94 mL/min — ABNORMAL LOW (ref >60.00)
Glucose, Bld: 112 mg/dL — ABNORMAL HIGH (ref 70–99)
Potassium: 3.4 mEq/L — ABNORMAL LOW (ref 3.5–5.1)
Sodium: 137 mEq/L (ref 135–145)

## 2012-08-20 NOTE — Patient Instructions (Addendum)
Try to stay active but safe  Stay on your same medicines  We will see you back in 3 months  We are checking labs today  Call the [facility] office at [phone] if you have any questions, problems or concerns.

## 2012-08-20 NOTE — Progress Notes (Addendum)
Jacqueline [name] Date of Birth: Nov 30, 1916 Medical Record #[id number]  History of Present Illness: Jacqueline [name] is seen back today for a post hospital visit. She is seen for Dr. Swaziland. She has CAD, HTN, mild AS and hypothroidism. Was recently admitted with unstable angina and underwent cath after failing on medical therapy in the hospital. She had severe LAD disease which was treated with rotational atherectomy and DES placement.   She comes back today. She is here with family. She is in a wheelchair. Currently at [facility]. Doing ok. No more chest pain. More limited by her legs and this is a chronic issue. She has fallen since returning home. Larey Seat out of a chair. Bruised her left temple. Not passing out. Not dizzy. Balance is "terrible". Ok on her medicines. Reports no problems with her cath sites.  Current Outpatient Prescriptions on File Prior to Visit  Medication Sig Dispense Refill  . amLODipine (NORVASC) 10 MG tablet Take 1 tablet (10 mg total) by mouth daily.  30 tablet  6  . aspirin 81 MG tablet Take 1 tablet (81 mg total) by mouth daily.      Marland Kitchen atorvastatin (LIPITOR) 20 MG tablet Take 1 tablet (20 mg total) by mouth daily at 6 PM.  30 tablet  6  . carvedilol (COREG) 25 MG tablet Take 1 tablet (25 mg total) by mouth 2 (two) times daily with a meal.  60 tablet  6  . clopidogrel (PLAVIX) 75 MG tablet Take 1 tablet (75 mg total) by mouth daily with breakfast.  30 tablet  6  . furosemide (LASIX) 40 MG tablet Take 40 mg by mouth daily as needed (for swelling).       Marland Kitchen levothyroxine (SYNTHROID, LEVOTHROID) 125 MCG tablet Take 125 mcg by mouth daily.      . nitroGLYCERIN (NITROSTAT) 0.4 MG SL tablet Place 1 tablet (0.4 mg total) under the tongue every 5 (five) minutes as needed for chest pain.  25 tablet  3  . olmesartan (BENICAR) 40 MG tablet Take 40 mg by mouth daily.       No current facility-administered medications on file prior to visit.    No Known Allergies  Past Medical History    Diagnosis Date  . Hypertension   . Mild aortic stenosis   . Diabetes mellitus   . Dizziness   . Gout   . Hypothyroidism   . CAD (coronary artery disease)     a. 2-08/2012: Cath/PCI: LM 10-20d, LAD 60-70p, 84m (Rotablator->2.5x32 Promus Premier DES), D1 70-80ost, D2- small-90 ost/95p, LCX large, OM1 40p, OM2 small 80-90, RCA dominant 56m, Ef 65-70%.    Past Surgical History  Procedure Laterality Date  . Left knee replacement    . Breast biopsies    . Cataract extraction      History  Smoking status  . Never Smoker   Smokeless tobacco  . Not on file    History  Alcohol Use No    Family History  Problem Relation Age of Onset  . Alzheimer's disease Brother   . Heart disease Father 46    Review of Systems: The review of systems is per the HPI.  All other systems were reviewed and are negative.  Physical Exam: There were no vitals taken for this visit. Patient is very pleasant and in no acute distress. She is in a wheelchair. Skin is warm and dry. Color is normal.  HEENT is unremarkable except for resolving bruising over the left temple. Normocephalic/atraumatic.  PERRL. Sclera are nonicteric. Neck is supple. No masses. No JVD. Lungs are clear. Cardiac exam shows a regular rate and rhythm. She has an outflow murmur noted. Abdomen is soft. Extremities are without edema. Gait is not tested. ROM appears intact. No gross neurologic deficits noted.   LABORATORY DATA: BMET and CBC pending for today  Lab Results  Component Value Date   WBC 7.3 08/06/2012   HGB 13.1 08/06/2012   HCT 38.7 08/06/2012   PLT 151 08/06/2012   GLUCOSE 95 08/06/2012   ALT 81* 12/29/2011   AST 155* 12/29/2011   NA 138 08/06/2012   K 3.6 08/06/2012   CL 106 08/06/2012   CREATININE 0.73 08/06/2012   BUN 21 08/06/2012   CO2 23 08/06/2012   INR 1.03 08/02/2012   HGBA1C 5.9* 07/31/2012   Coronary angiography:   Left mainstem: The left main coronary is moderately calcified in the distal vessel. It has 10-20% narrowing  distally.  Left anterior descending (LAD): The left anterior descending artery is heavily calcified in the proximal to mid vessel. There is a 60-70% stenosis in the proximal LAD. This is followed by a 95% stenosis in the mid LAD with TIMI 2 flow distally. The first diagonal is a moderate-sized vessel which is calcified at the ostium with 70-80% ostial stenosis. The second diagonal is a very small branch arising from the segment with 95% stenosis. It has a 90% ostial stenosis.  Left circumflex (LCx): The left circumflex is large vessel is moderately calcified. It gives rise to a large bifurcating marginal branch and then continues in the AV groove. It gives rise to a very small marginal branch on the lateral wall. There is 40% disease in the proximal first OM. The small marginal branch on the lateral wall has a 80-90% stenosis. This vessel is only about a millimeter in diameter.  Right coronary artery (RCA): The right coronary is a dominant vessel. It is moderately calcified in the proximal- mid vessel. In the mid vessel there is a 30% stenosis.  Left ventriculography: Left ventricular systolic function is hyperdynamic, LVEF is estimated at 65-70%, there is no significant mitral regurgitation   Final Conclusions:  1. Critical single vessel obstructive coronary disease involving the LAD. This vessel has complex disease with heavy calcification, long segmental disease, and involvement of diagonal side branches.  2. Hyperdynamic LV function.   Recommendations: We will discuss options for PCI with the patient. I think she would be a suitable candidate for PCI of the LAD but may require rotational atherectomy. We'll start dual antiplatelet therapy. This weekend we will treat her with IV nitroglycerin and heparin and tentatively plan for PCI on Monday. Will check P2Y12 function on Plavix.   Jacqueline [name]  08/02/2012, 3:38 PM   PCI Conclusions:  Successful stenting of the proximal to mid LAD following  rotational atherectomy.  Recommendations: Continue dual antiplatelet therapy for one year.   Jacqueline [name]  08/05/2012, 3:26 PM   Assessment / Plan: 1. Unstable angina with rotational atherectomy and DES placement to the LAD. EF is normal. She is doing ok. See her back in 3 months. Check follow up BMET and CBC today.   2. HTN - BP looks ok.   3. Mild AS - last echo in August of 2011. Her chest pain is resolved. No other cardinal symptoms noted.   4. Advanced age   79. Falls - I can see this as a recurrent issue.   Patient is agreeable to this plan and  will call if any problems develop in the interim.   Rosalio Macadamia, RN, ANP-C [facility] [address] [place], [place]  [address]

## 2012-09-05 ENCOUNTER — Telehealth: Payer: Self-pay | Admitting: Cardiology

## 2012-09-05 NOTE — Telephone Encounter (Signed)
Called patient back. She reports that she has had sinus congestion and cough since Monday. Advised her to call PCP tomorrow to be seen. She states she will call for appointment with Dr.Stallings.

## 2012-09-05 NOTE — Telephone Encounter (Signed)
New problem   Pt stated she has had coughing and nasal congestion from the [facility] or one of her other medications. Pt want to talk to someone concerning this. Please call pt after 3pm today

## 2012-09-24 ENCOUNTER — Encounter: Payer: Self-pay | Admitting: Nurse Practitioner

## 2012-09-25 ENCOUNTER — Telehealth: Payer: Self-pay | Admitting: *Deleted

## 2012-09-25 NOTE — Telephone Encounter (Signed)
T/w Lawson Fiscal about non pain reliever I s/w pt and told pt the safest med to use for pain is tylenol. Lawson Fiscal stated Advil, motrin, ibuprofen can run up pts bp and cause swelling pt verbally understood

## 2012-11-15 ENCOUNTER — Ambulatory Visit (INDEPENDENT_AMBULATORY_CARE_PROVIDER_SITE_OTHER): Payer: Medicare Other | Admitting: Cardiology

## 2012-11-15 ENCOUNTER — Encounter: Payer: Self-pay | Admitting: Cardiology

## 2012-11-15 VITALS — BP 130/52 | HR 60 | Ht 64.0 in | Wt 199.0 lb

## 2012-11-15 DIAGNOSIS — I10 Essential (primary) hypertension: Secondary | ICD-10-CM

## 2012-11-15 DIAGNOSIS — I359 Nonrheumatic aortic valve disorder, unspecified: Secondary | ICD-10-CM

## 2012-11-15 DIAGNOSIS — I350 Nonrheumatic aortic (valve) stenosis: Secondary | ICD-10-CM

## 2012-11-15 DIAGNOSIS — I2510 Atherosclerotic heart disease of native coronary artery without angina pectoris: Secondary | ICD-10-CM

## 2012-11-15 MED ORDER — POTASSIUM CHLORIDE ER 10 MEQ PO TBCR: 10.0000 meq | EXTENDED_RELEASE_TABLET | Freq: Every day | ORAL | Status: DC

## 2012-11-15 NOTE — Patient Instructions (Addendum)
Continue your current medication  I will see you in 4 months.  

## 2012-11-16 NOTE — Progress Notes (Signed)
Jacqueline [name] Date of Birth: 1917-06-03 Medical Record #[id number]  History of Present Illness: Jacqueline [name] is seen back today for a follow up visit. She has CAD, HTN, mild AS and hypothroidism. She was admitted with [facility] in March 2014. She had severe LAD disease which was treated with rotational atherectomy and DES placement.  And on followup today she states she is doing well. She denies any chest pain or shortness of breath. She has no edema. She is living at [facility]. She does have chronic leg weakness and walks with a walker. She also has chronic lower extremity edema. She is wearing support hose with improvement.  Current Outpatient Prescriptions on File Prior to Visit  Medication Sig Dispense Refill  . acetaminophen (TYLENOL) 325 MG tablet Take 650 mg by mouth every 6 (six) hours as needed for pain.      Marland Kitchen amLODipine (NORVASC) 10 MG tablet Take 1 tablet (10 mg total) by mouth daily.  30 tablet  6  . aspirin 81 MG tablet Take 1 tablet (81 mg total) by mouth daily.      Marland Kitchen atorvastatin (LIPITOR) 20 MG tablet Take 1 tablet (20 mg total) by mouth daily at 6 PM.  30 tablet  6  . carvedilol (COREG) 25 MG tablet Take 1 tablet (25 mg total) by mouth 2 (two) times daily with a meal.  60 tablet  6  . clopidogrel (PLAVIX) 75 MG tablet Take 1 tablet (75 mg total) by mouth daily with breakfast.  30 tablet  6  . furosemide (LASIX) 40 MG tablet Take 40 mg by mouth daily as needed (for swelling).       Marland Kitchen levothyroxine (SYNTHROID, LEVOTHROID) 125 MCG tablet Take 125 mcg by mouth daily.      . nitroGLYCERIN (NITROSTAT) 0.4 MG SL tablet Place 1 tablet (0.4 mg total) under the tongue every 5 (five) minutes as needed for chest pain.  25 tablet  3  . olmesartan (BENICAR) 40 MG tablet Take 40 mg by mouth daily.       No current facility-administered medications on file prior to visit.    No Known Allergies  Past Medical History  Diagnosis Date  . Hypertension   . Mild aortic stenosis   . Diabetes  mellitus   . Dizziness   . Gout   . Hypothyroidism   . CAD (coronary artery disease)     a. 2-08/2012: Cath/PCI: LM 10-20d, LAD 60-70p, 91m (Rotablator->2.5x32 Promus Premier DES), D1 70-80ost, D2- small-90 ost/95p, LCX large, OM1 40p, OM2 small 80-90, RCA dominant 27m, Ef 65-70%.    Past Surgical History  Procedure Laterality Date  . Left knee replacement    . Breast biopsies    . Cataract extraction      History  Smoking status  . Never Smoker   Smokeless tobacco  . Not on file    History  Alcohol Use No    Family History  Problem Relation Age of Onset  . Alzheimer's disease Brother   . Heart disease Father 58    Review of Systems: The review of systems is per the HPI.  All other systems were reviewed and are negative.  Physical Exam: BP 130/52  Pulse 60  Ht 5\' 4"  (1.626 m)  Wt 199 lb (90.266 kg)  BMI 34.14 kg/m2 Patient is very pleasant and in no acute distress. Skin is warm and dry. Color is normal.  HEENT is unremarkable. Normocephalic/atraumatic. PERRL. Sclera are nonicteric. Neck is supple. No masses.  No JVD. Lungs are clear. Cardiac exam shows a regular rate and rhythm. She has a grade 2/6 outflow murmur noted. Abdomen is soft. Extremities reveal 2+. Gait is not tested. ROM appears intact. No gross neurologic deficits noted.   LABORATORY DATA: BMET and CBC pending for today  Lab Results  Component Value Date   WBC 7.6 08/20/2012   HGB 12.7 08/20/2012   HCT 37.5 08/20/2012   PLT 178.0 08/20/2012   GLUCOSE 112* 08/20/2012   ALT 81* 12/29/2011   AST 155* 12/29/2011   NA 137 08/20/2012   K 3.4* 08/20/2012   CL 106 08/20/2012   CREATININE 0.9 08/20/2012   BUN 21 08/20/2012   CO2 23 08/20/2012   INR 1.03 08/02/2012   HGBA1C 5.9* 07/31/2012     Assessment / Plan: 1. Unstable angina with rotational atherectomy and DES placement to the LAD in March 2014. EF is normal. She is doing well without recurrent angina. See her back in 4 months.   2. HTN - BP is  well-controlled.  3. Mild AS - last echo in August of 2011. No significant gradient by cardiac catheterization.   4. Advanced age   28. Chronic leg weakness.   6. Edema. Continue Lasix 40 mg daily. Continue support hose. Sodium restriction.

## 2012-11-29 ENCOUNTER — Telehealth: Payer: Self-pay | Admitting: *Deleted

## 2012-11-29 ENCOUNTER — Telehealth: Payer: Self-pay | Admitting: Cardiology

## 2012-11-29 MED ORDER — OLMESARTAN MEDOXOMIL 40 MG PO TABS: 40.0000 mg | ORAL_TABLET | Freq: Every day | ORAL | Status: DC

## 2012-11-29 NOTE — Telephone Encounter (Signed)
See previous 11/29/12 note.

## 2012-11-29 NOTE — Telephone Encounter (Signed)
Patient calling b/c was told by [facility] Dr or Nurse has to call in Rx to have patient receive medication for tomorrow. She is currently out and says it is too expensive to use her local pharmacy.   Speaking with [facility] they verbally refilled medication but said to have expedite shipping they must speak with patient. When calling back patient, no one answered and there was not a voicemail to leave a message.  Will forward to [place] if perhaps the patient calls back.    Axelle Szwed [employer]

## 2012-11-29 NOTE — Telephone Encounter (Signed)
Returning patient's call about Benicar. [facility] had to verify all patient information once again and explained they will not see rx in system for 24hrs, after the 24 hrs it may take another 48 to be ready to ship to patient. It is then when patient ask for expedite shipping ($21) for over night delivery but patient will be out before then. With her insurance she can get a small refill for around $17 for 2 wks to insure medication has arrived from [facility]. Patient agreed and said she does not want to pay for expedite shipping. She apologized for having [place] keep calling  [facility]. She verbally agreed about sending rx to [facility] and said thank you for being patient.

## 2012-11-29 NOTE — Telephone Encounter (Signed)
Patient returning call about [facility]. I let the patient know that [facility] has to personally talk to her for expedite shipping and to call the exact same number in which she gave me. They must verify her personally information. She verbally agreed and said thank you.   Jacqueline [name],cma

## 2012-11-29 NOTE — Telephone Encounter (Signed)
New Prob     Pt has some questions related to her BENICAR medication. Please call.

## 2012-12-05 ENCOUNTER — Other Ambulatory Visit: Payer: Self-pay

## 2012-12-05 MED ORDER — FUROSEMIDE 40 MG PO TABS: 40.0000 mg | ORAL_TABLET | Freq: Every day | ORAL | Status: DC | PRN

## 2012-12-05 MED ORDER — CLOPIDOGREL BISULFATE 75 MG PO TABS: 75.0000 mg | ORAL_TABLET | Freq: Every day | ORAL | Status: DC

## 2012-12-05 MED ORDER — NITROGLYCERIN 0.4 MG SL SUBL: 0.4000 mg | SUBLINGUAL_TABLET | SUBLINGUAL | Status: DC | PRN

## 2012-12-05 MED ORDER — CARVEDILOL 25 MG PO TABS: 25.0000 mg | ORAL_TABLET | Freq: Two times a day (BID) | ORAL | Status: DC

## 2012-12-05 NOTE — Telephone Encounter (Signed)
furosemide (LASIX) 40 MG tablet  Take 40 mg by mouth daily as needed (for swelling).

## 2013-01-24 ENCOUNTER — Telehealth: Payer: Self-pay | Admitting: Cardiology

## 2013-01-24 NOTE — Telephone Encounter (Signed)
Returned call to patient she stated she woke up during the night with chest pain,chest heaviness.Stated she had chest pain again this morning and nurse at center gave her NTG x 2 with relief.Stated she feels fine now no chest pain.Stated she is getting ready to eat lunch.Advised if she has any more chest pain she needs to go to ER.

## 2013-01-24 NOTE — Telephone Encounter (Signed)
New Problem  c/o chest discomfort this morning // Was given two nitros by the nurse on duty because BP was low// 90/60// wanted to touch base with the nurse..request a call back to discuss.  Nurse did not disclose where the patient was.

## 2013-03-19 ENCOUNTER — Encounter: Payer: Self-pay | Admitting: Cardiology

## 2013-03-19 ENCOUNTER — Ambulatory Visit (INDEPENDENT_AMBULATORY_CARE_PROVIDER_SITE_OTHER): Payer: Medicare Other | Admitting: Cardiology

## 2013-03-19 VITALS — BP 110/60 | HR 72 | Ht 64.0 in | Wt 196.0 lb

## 2013-03-19 DIAGNOSIS — I350 Nonrheumatic aortic (valve) stenosis: Secondary | ICD-10-CM

## 2013-03-19 DIAGNOSIS — I359 Nonrheumatic aortic valve disorder, unspecified: Secondary | ICD-10-CM

## 2013-03-19 DIAGNOSIS — I10 Essential (primary) hypertension: Secondary | ICD-10-CM

## 2013-03-19 DIAGNOSIS — I2510 Atherosclerotic heart disease of native coronary artery without angina pectoris: Secondary | ICD-10-CM

## 2013-03-19 MED ORDER — AMLODIPINE BESYLATE 5 MG PO TABS: 5.0000 mg | ORAL_TABLET | Freq: Every day | ORAL | Status: DC

## 2013-03-19 NOTE — Patient Instructions (Addendum)
Continue your current therapy except we will reduce amlodipine to 5 mg daily  I will see you in 6 months.

## 2013-03-19 NOTE — Progress Notes (Signed)
Jacqueline [name] Date of Birth: [birth year] Medical Record #[id number]  History of Present Illness: Jacqueline [name] is seen back today for a follow up visit. She has CAD, HTN, mild AS and hypothroidism. She was admitted with [facility]AP in March 2014. She had refractory symptoms despite optimal medical therapy. She had severe LAD disease which was treated with rotational atherectomy and DES placement.  On followup today she reports only one episode of chest pain since June. This was in August. She had pain on the outside of her chest. She did receive nitroglycerin with relief. She has had no chest pain since then. She does complain of significant edema. She is wearing TED hose. She takes Lasix 40 mg a day but sometimes only takes half a dose because of incontinence. She reports difficulty walking and does use a walker.  Current Outpatient Prescriptions on File Prior to Visit  Medication Sig Dispense Refill  . acetaminophen (TYLENOL) 325 MG tablet Take 650 mg by mouth every 6 (six) hours as needed for pain.      Marland Kitchen aspirin 81 MG tablet Take 1 tablet (81 mg total) by mouth daily.      Marland Kitchen atorvastatin (LIPITOR) 20 MG tablet Take 1 tablet (20 mg total) by mouth daily at 6 PM.  30 tablet  6  . carvedilol (COREG) 25 MG tablet Take 1 tablet (25 mg total) by mouth 2 (two) times daily with a meal.  180 tablet  3  . clopidogrel (PLAVIX) 75 MG tablet Take 1 tablet (75 mg total) by mouth daily with breakfast.  90 tablet  3  . furosemide (LASIX) 40 MG tablet Take 1 tablet (40 mg total) by mouth daily as needed (for swelling).  90 tablet  3  . levothyroxine (SYNTHROID, LEVOTHROID) 125 MCG tablet Take 125 mcg by mouth daily.      . nitroGLYCERIN (NITROSTAT) 0.4 MG SL tablet Place 1 tablet (0.4 mg total) under the tongue every 5 (five) minutes as needed for chest pain.  90 tablet  1  . olmesartan (BENICAR) 40 MG tablet Take 1 tablet (40 mg total) by mouth daily.  15 tablet  0  . potassium chloride (K-DUR) 10 MEQ tablet Take 1 tablet  (10 mEq total) by mouth daily.  90 tablet  3   No current facility-administered medications on file prior to visit.    No Known Allergies  Past Medical History  Diagnosis Date  . Hypertension   . Mild aortic stenosis   . Diabetes mellitus   . Dizziness   . Gout   . Hypothyroidism   . CAD (coronary artery disease)     a. 2-08/2012: Cath/PCI: LM 10-20d, LAD 60-70p, 73m (Rotablator->2.5x32 Promus Premier DES), D1 70-80ost, D2- small-90 ost/95p, LCX large, OM1 40p, OM2 small 80-90, RCA dominant 25m, Ef 65-70%.    Past Surgical History  Procedure Laterality Date  . Left knee replacement    . Breast biopsies    . Cataract extraction      History  Smoking status  . Never Smoker   Smokeless tobacco  . Not on file    History  Alcohol Use No    Family History  Problem Relation Age of Onset  . Alzheimer's disease Brother   . Heart disease Father 77    Review of Systems: The review of systems is per the HPI.  All other systems were reviewed and are negative.  Physical Exam: BP 110/60  Pulse 72  Ht 5\' 4"  (1.626 m)  Wt  196 lb (88.905 kg)  BMI 33.63 kg/m2 Patient is very pleasant and in no acute distress. Skin is warm and dry. Color is normal.  HEENT is unremarkable. Normocephalic/atraumatic. PERRL. Sclera are nonicteric. Neck is supple. No masses. No JVD. Lungs are clear. Cardiac exam shows a regular rate and rhythm. She has a grade 2/6 outflow murmur noted. Abdomen is soft. Extremities reveal 2+ edema. Gait is not tested. ROM appears intact. No gross neurologic deficits noted.   LABORATORY DATA:   Lab Results  Component Value Date   WBC 7.6 08/20/2012   HGB 12.7 08/20/2012   HCT 37.5 08/20/2012   PLT 178.0 08/20/2012   GLUCOSE 112* 08/20/2012   ALT 81* 12/29/2011   AST 155* 12/29/2011   NA 137 08/20/2012   K 3.4* 08/20/2012   CL 106 08/20/2012   CREATININE 0.9 08/20/2012   BUN 21 08/20/2012   CO2 23 08/20/2012   INR 1.03 08/02/2012   HGBA1C 5.9* 07/31/2012      Assessment / Plan: 1. Unstable angina with rotational atherectomy and DES placement to the LAD in March 2014. EF is normal. She is doing well without recurrent angina. I'll followup again in 6 months. We will consider stopping Plavix at one year.  2. HTN - BP is well-controlled.  3. Mild AS - last echo in August of 2011. No significant gradient by cardiac catheterization.   4. Advanced age   7. Chronic leg weakness.   6. Edema. Continue Lasix 40 mg daily. Continue support hose. Sodium restriction. I recommended reducing amlodipine to 5 mg daily.

## 2013-04-21 ENCOUNTER — Other Ambulatory Visit: Payer: Self-pay | Admitting: *Deleted

## 2013-04-21 ENCOUNTER — Telehealth: Payer: Self-pay | Admitting: Cardiology

## 2013-04-21 MED ORDER — NITROGLYCERIN 0.4 MG SL SUBL: 0.4000 mg | SUBLINGUAL_TABLET | SUBLINGUAL | Status: DC | PRN

## 2013-04-21 NOTE — Telephone Encounter (Signed)
New message     Can you call the mail order pharmacy and cancel her nitrostat pills---she says she has at least 6 bottles.  She will call us when she needs more.  The pharmacy keeps calling her and telling her it is time to reorder more.

## 2013-04-21 NOTE — Telephone Encounter (Signed)
Please put hold on refilling nitrostat, patient has 6 bottles.

## 2013-04-29 NOTE — Telephone Encounter (Signed)
New problem   Pt was taken off Norvasc by Dr Creta Levin b/c of feet swelling. Just an [place]

## 2013-04-30 NOTE — Telephone Encounter (Signed)
Returned call to patient 04/29/13 she stated she wanted Dr.Jordan to know PCP Dr.Stallings stopped norvasc due to swelling in both feet.Dr.Jordan out of office will let him know next week.Also refills for NTG have already been taken care of.

## 2013-05-16 ENCOUNTER — Emergency Department (HOSPITAL_COMMUNITY): Payer: Medicare Other

## 2013-05-16 ENCOUNTER — Inpatient Hospital Stay (HOSPITAL_COMMUNITY)
Admission: EM | Admit: 2013-05-16 | Discharge: 2013-05-19 | DRG: 280 | Disposition: A | Discharge status: Skilled Nursing Facility | Payer: Medicare Other | Attending: Internal Medicine | Admitting: Internal Medicine

## 2013-05-16 ENCOUNTER — Encounter (HOSPITAL_COMMUNITY): Payer: Self-pay | Admitting: Emergency Medicine

## 2013-05-16 DIAGNOSIS — M109 Gout, unspecified: Secondary | ICD-10-CM | POA: Diagnosis present

## 2013-05-16 DIAGNOSIS — Z79899 Other long term (current) drug therapy: Secondary | ICD-10-CM

## 2013-05-16 DIAGNOSIS — Z9849 Cataract extraction status, unspecified eye: Secondary | ICD-10-CM

## 2013-05-16 DIAGNOSIS — R079 Chest pain, unspecified: Secondary | ICD-10-CM

## 2013-05-16 DIAGNOSIS — E039 Hypothyroidism, unspecified: Secondary | ICD-10-CM | POA: Diagnosis present

## 2013-05-16 DIAGNOSIS — Z7902 Long term (current) use of antithrombotics/antiplatelets: Secondary | ICD-10-CM

## 2013-05-16 DIAGNOSIS — E785 Hyperlipidemia, unspecified: Secondary | ICD-10-CM | POA: Diagnosis present

## 2013-05-16 DIAGNOSIS — L03039 Cellulitis of unspecified toe: Secondary | ICD-10-CM | POA: Diagnosis present

## 2013-05-16 DIAGNOSIS — L02419 Cutaneous abscess of limb, unspecified: Secondary | ICD-10-CM | POA: Diagnosis present

## 2013-05-16 DIAGNOSIS — I509 Heart failure, unspecified: Secondary | ICD-10-CM | POA: Diagnosis present

## 2013-05-16 DIAGNOSIS — Z820 Family history of epilepsy and other diseases of the nervous system: Secondary | ICD-10-CM

## 2013-05-16 DIAGNOSIS — J40 Bronchitis, not specified as acute or chronic: Secondary | ICD-10-CM | POA: Diagnosis present

## 2013-05-16 DIAGNOSIS — E119 Type 2 diabetes mellitus without complications: Secondary | ICD-10-CM | POA: Diagnosis present

## 2013-05-16 DIAGNOSIS — E871 Hypo-osmolality and hyponatremia: Secondary | ICD-10-CM | POA: Diagnosis present

## 2013-05-16 DIAGNOSIS — Z23 Encounter for immunization: Secondary | ICD-10-CM

## 2013-05-16 DIAGNOSIS — D72829 Elevated white blood cell count, unspecified: Secondary | ICD-10-CM | POA: Diagnosis present

## 2013-05-16 DIAGNOSIS — I10 Essential (primary) hypertension: Secondary | ICD-10-CM | POA: Diagnosis present

## 2013-05-16 DIAGNOSIS — R0789 Other chest pain: Secondary | ICD-10-CM

## 2013-05-16 DIAGNOSIS — Z9861 Coronary angioplasty status: Secondary | ICD-10-CM

## 2013-05-16 DIAGNOSIS — I359 Nonrheumatic aortic valve disorder, unspecified: Secondary | ICD-10-CM | POA: Diagnosis present

## 2013-05-16 DIAGNOSIS — L02619 Cutaneous abscess of unspecified foot: Secondary | ICD-10-CM | POA: Diagnosis present

## 2013-05-16 DIAGNOSIS — I214 Non-ST elevation (NSTEMI) myocardial infarction: Principal | ICD-10-CM | POA: Diagnosis present

## 2013-05-16 DIAGNOSIS — I2510 Atherosclerotic heart disease of native coronary artery without angina pectoris: Secondary | ICD-10-CM | POA: Diagnosis present

## 2013-05-16 DIAGNOSIS — Z7982 Long term (current) use of aspirin: Secondary | ICD-10-CM

## 2013-05-16 DIAGNOSIS — Z8249 Family history of ischemic heart disease and other diseases of the circulatory system: Secondary | ICD-10-CM

## 2013-05-16 DIAGNOSIS — I350 Nonrheumatic aortic (valve) stenosis: Secondary | ICD-10-CM | POA: Diagnosis present

## 2013-05-16 DIAGNOSIS — I5031 Acute diastolic (congestive) heart failure: Secondary | ICD-10-CM | POA: Diagnosis present

## 2013-05-16 DIAGNOSIS — Z96659 Presence of unspecified artificial knee joint: Secondary | ICD-10-CM

## 2013-05-16 HISTORY — DX: Shortness of breath: R06.02

## 2013-05-16 HISTORY — DX: Hyperlipidemia, unspecified: E78.5

## 2013-05-16 HISTORY — DX: Other symptoms and signs involving the musculoskeletal system: R29.898

## 2013-05-16 LAB — CBC WITH DIFFERENTIAL/PLATELET
Basophils Absolute: 0 10*3/uL (ref 0.0–0.1)
Basophils Relative: 0 % (ref 0–1)
Eosinophils Absolute: 0 10*3/uL (ref 0.0–0.7)
Eosinophils Absolute: 0 10*3/uL (ref 0.0–0.7)
Eosinophils Relative: 0 % (ref 0–5)
Eosinophils Relative: 0 % (ref 0–5)
HCT: 43.6 % (ref 36.0–46.0)
Hemoglobin: 14.6 g/dL (ref 12.0–15.0)
Hemoglobin: 14.3 g/dL (ref 12.0–15.0)
Lymphocytes Relative: 2 % — ABNORMAL LOW (ref 12–46)
Lymphocytes Relative: 3 % — ABNORMAL LOW (ref 12–46)
Lymphs Abs: 0.4 10*3/uL — ABNORMAL LOW (ref 0.7–4.0)
Lymphs Abs: 0.7 10*3/uL (ref 0.7–4.0)
MCH: 29.5 pg (ref 26.0–34.0)
MCH: 30.5 pg (ref 26.0–34.0)
MCHC: 33.5 g/dL (ref 30.0–36.0)
MCV: 88.1 fL (ref 78.0–100.0)
Monocytes Absolute: 0.9 10*3/uL (ref 0.1–1.0)
Monocytes Relative: 4 % (ref 3–12)
Neutro Abs: 18.5 10*3/uL — ABNORMAL HIGH (ref 1.7–7.7)
Neutro Abs: 18.6 10*3/uL — ABNORMAL HIGH (ref 1.7–7.7)
Neutrophils Relative %: 94 % — ABNORMAL HIGH (ref 43–77)
Neutrophils Relative %: 92 % — ABNORMAL HIGH (ref 43–77)
Platelets: 171 10*3/uL (ref 150–400)
Platelets: 149 10*3/uL — ABNORMAL LOW (ref 150–400)
RBC: 4.95 MIL/uL (ref 3.87–5.11)
RBC: 4.69 MIL/uL (ref 3.87–5.11)
RDW: 14.7 % (ref 11.5–15.5)
WBC: 19.8 10*3/uL — ABNORMAL HIGH (ref 4.0–10.5)
WBC: 20.1 10*3/uL — ABNORMAL HIGH (ref 4.0–10.5)

## 2013-05-16 LAB — TROPONIN I
Troponin I: 0.65 ng/mL (ref <0.30)
Troponin I: 2.35 ng/mL (ref <0.30)

## 2013-05-16 LAB — BASIC METABOLIC PANEL
BUN: 17 mg/dL (ref 6–23)
CO2: 25 mEq/L (ref 19–32)
Chloride: 100 mEq/L (ref 96–112)
Creatinine, Ser: 0.66 mg/dL (ref 0.50–1.10)
Glucose, Bld: 134 mg/dL — ABNORMAL HIGH (ref 70–99)

## 2013-05-16 LAB — CREATININE, SERUM
Creatinine, Ser: 0.63 mg/dL (ref 0.50–1.10)
GFR calc non Af Amer: 74 mL/min — ABNORMAL LOW (ref >90)

## 2013-05-16 MED ORDER — ONDANSETRON HCL 4 MG/2ML IJ SOLN
4.0000 mg | Freq: Once | INTRAMUSCULAR | Status: AC
  Administered 2013-05-16: 4 mg via INTRAVENOUS
  Filled 2013-05-16: qty 2

## 2013-05-16 MED ORDER — SODIUM CHLORIDE 0.9 % IJ SOLN
3.0000 mL | Freq: Two times a day (BID) | INTRAMUSCULAR | Status: DC
  Administered 2013-05-16 – 2013-05-19 (×3): 3 mL via INTRAVENOUS

## 2013-05-16 MED ORDER — ATORVASTATIN CALCIUM 20 MG PO TABS
20.0000 mg | ORAL_TABLET | Freq: Every day | ORAL | Status: DC
  Administered 2013-05-16 – 2013-05-18 (×3): 20 mg via ORAL
  Filled 2013-05-16 (×4): qty 1

## 2013-05-16 MED ORDER — ONDANSETRON HCL 4 MG/2ML IJ SOLN
4.0000 mg | Freq: Four times a day (QID) | INTRAMUSCULAR | Status: DC | PRN
  Administered 2013-05-16: 4 mg via INTRAVENOUS
  Filled 2013-05-16: qty 2

## 2013-05-16 MED ORDER — LEVOTHYROXINE SODIUM 125 MCG PO TABS
125.0000 ug | ORAL_TABLET | Freq: Every day | ORAL | Status: DC
  Administered 2013-05-17 – 2013-05-19 (×3): 125 ug via ORAL
  Filled 2013-05-16 (×5): qty 1

## 2013-05-16 MED ORDER — LISINOPRIL 2.5 MG PO TABS: 2.5000 mg | ORAL_TABLET | Freq: Every day | ORAL | Status: DC

## 2013-05-16 MED ORDER — FUROSEMIDE 10 MG/ML IJ SOLN
40.0000 mg | Freq: Two times a day (BID) | INTRAMUSCULAR | Status: DC
  Administered 2013-05-16 – 2013-05-18 (×4): 40 mg via INTRAVENOUS
  Filled 2013-05-16 (×6): qty 4

## 2013-05-16 MED ORDER — ACETAMINOPHEN 325 MG PO TABS: 650.0000 mg | ORAL_TABLET | ORAL | Status: DC | PRN

## 2013-05-16 MED ORDER — MORPHINE SULFATE 2 MG/ML IJ SOLN
2.0000 mg | INTRAMUSCULAR | Status: DC | PRN
  Administered 2013-05-16: 2 mg via INTRAVENOUS
  Filled 2013-05-16: qty 1

## 2013-05-16 MED ORDER — GI COCKTAIL ~~LOC~~
30.0000 mL | Freq: Once | ORAL | Status: AC
  Administered 2013-05-16: 30 mL via ORAL
  Filled 2013-05-16: qty 30

## 2013-05-16 MED ORDER — DEXTROSE 5 % IV SOLN
1.0000 g | INTRAVENOUS | Status: DC
  Administered 2013-05-17 (×2): 1 g via INTRAVENOUS
  Filled 2013-05-16 (×3): qty 10

## 2013-05-16 MED ORDER — POTASSIUM CHLORIDE ER 10 MEQ PO TBCR
10.0000 meq | EXTENDED_RELEASE_TABLET | Freq: Every day | ORAL | Status: DC
  Administered 2013-05-17 – 2013-05-19 (×3): 10 meq via ORAL
  Filled 2013-05-16 (×3): qty 1

## 2013-05-16 MED ORDER — SODIUM CHLORIDE 0.9 % IV SOLN
INTRAVENOUS | Status: DC
  Administered 2013-05-16: 08:00:00 via INTRAVENOUS

## 2013-05-16 MED ORDER — CLOPIDOGREL BISULFATE 75 MG PO TABS
75.0000 mg | ORAL_TABLET | Freq: Every day | ORAL | Status: DC
  Administered 2013-05-17 – 2013-05-19 (×3): 75 mg via ORAL
  Filled 2013-05-16 (×3): qty 1

## 2013-05-16 MED ORDER — NITROGLYCERIN 0.4 MG SL SUBL
0.4000 mg | SUBLINGUAL_TABLET | SUBLINGUAL | Status: DC | PRN
  Administered 2013-05-16: 0.4 mg via SUBLINGUAL

## 2013-05-16 MED ORDER — SODIUM CHLORIDE 0.9 % IJ SOLN: 3.0000 mL | INTRAMUSCULAR | Status: DC | PRN

## 2013-05-16 MED ORDER — SODIUM CHLORIDE 0.9 % IV SOLN: 250.0000 mL | INTRAVENOUS | Status: DC | PRN

## 2013-05-16 MED ORDER — POTASSIUM CHLORIDE CRYS ER 20 MEQ PO TBCR
40.0000 meq | EXTENDED_RELEASE_TABLET | Freq: Two times a day (BID) | ORAL | Status: AC
  Administered 2013-05-16: 40 meq via ORAL
  Filled 2013-05-16 (×2): qty 2

## 2013-05-16 MED ORDER — HEPARIN SODIUM (PORCINE) 5000 UNIT/ML IJ SOLN
5000.0000 [IU] | Freq: Three times a day (TID) | INTRAMUSCULAR | Status: DC
  Administered 2013-05-16 – 2013-05-17 (×2): 5000 [IU] via SUBCUTANEOUS
  Filled 2013-05-16 (×5): qty 1

## 2013-05-16 MED ORDER — CARVEDILOL 25 MG PO TABS
25.0000 mg | ORAL_TABLET | Freq: Two times a day (BID) | ORAL | Status: DC
  Administered 2013-05-16 – 2013-05-19 (×6): 25 mg via ORAL
  Filled 2013-05-16 (×8): qty 1

## 2013-05-16 MED ORDER — ASPIRIN EC 81 MG PO TBEC
81.0000 mg | DELAYED_RELEASE_TABLET | Freq: Every day | ORAL | Status: DC
  Administered 2013-05-17 – 2013-05-19 (×3): 81 mg via ORAL
  Filled 2013-05-16 (×3): qty 1

## 2013-05-16 MED ORDER — IRBESARTAN 75 MG PO TABS
75.0000 mg | ORAL_TABLET | Freq: Every day | ORAL | Status: DC
  Administered 2013-05-17 – 2013-05-19 (×3): 75 mg via ORAL
  Filled 2013-05-16 (×3): qty 1

## 2013-05-16 NOTE — ED Notes (Signed)
Pt states her chest is starting to hurt more.

## 2013-05-16 NOTE — ED Provider Notes (Signed)
CSN: [id number]     Arrival date & time 05/16/13  0705 History   First MD Initiated Contact with Patient 05/16/13 [phone]     Chief Complaint  Patient presents with  . Chest Pain    HPI Pt was seen at 0715.  Per EMS, NH report and pt, c/o gradual onset and persistence of several intermittent episodes of chest "pain" for the past 2 to 3 days. Pt describes the CP as "pressure," worsens with exertion, improves with rest and her SL ntg. Has been associated with mild SOB and increasing pedal edema. EMS gave ASA and SL ntg en route with improvement in symptoms. Denies palpitations, no cough, no abd pain, no N/V/D, no back pain, no fevers.     Past Medical History  Diagnosis Date  . Hypertension   . Mild aortic stenosis   . Diabetes mellitus   . Dizziness   . Gout   . Hypothyroidism   . CAD (coronary artery disease)     a. 2-08/2012: Cath/PCI: LM 10-20d, LAD 60-70p, 72m (Rotablator->2.5x32 Promus Premier DES), D1 70-80ost, D2- small-90 ost/95p, LCX large, OM1 40p, OM2 small 80-90, RCA dominant 50m, Ef 65-70%.  . Bilateral leg weakness     chronic  . Peripheral edema   . Unstable angina    Past Surgical History  Procedure Laterality Date  . Left knee replacement    . Breast biopsies    . Cataract extraction    . Coronary angioplasty with stent placement  march 2014   Family History  Problem Relation Age of Onset  . Alzheimer's disease Brother   . Heart disease Father 51   History  Substance Use Topics  . Smoking status: Never Smoker   . Smokeless tobacco: Not on file  . Alcohol Use: No    Review of Systems ROS: Statement: All systems negative except as marked or noted in the HPI; Constitutional: Negative for fever and chills. ; ; Eyes: Negative for eye pain, redness and discharge. ; ; ENMT: Negative for ear pain, hoarseness, nasal congestion, sinus pressure and sore throat. ; ; Cardiovascular: +CP, SOB, peripheral edema. Negative for palpitations, diaphoresis. ; ; Respiratory:  Negative for cough, wheezing and stridor. ; ; Gastrointestinal: Negative for nausea, vomiting, diarrhea, abdominal pain, blood in stool, hematemesis, jaundice and rectal bleeding. . ; ; Genitourinary: Negative for dysuria, flank pain and hematuria. ; ; Musculoskeletal: Negative for back pain and neck pain. Negative for swelling and trauma.; ; Skin: Negative for pruritus, rash, abrasions, blisters, bruising and skin lesion.; ; Neuro: Negative for headache, lightheadedness and neck stiffness. Negative for weakness, altered level of consciousness , altered mental status, extremity weakness, paresthesias, involuntary movement, seizure and syncope.     Allergies  Review of patient's allergies indicates no known allergies.  Home Medications   Current Outpatient Rx  Name  Route  Sig  Dispense  Refill  . acetaminophen (TYLENOL) 325 MG tablet   Oral   Take 650 mg by mouth every 6 (six) hours as needed for pain.         Marland Kitchen amLODipine (NORVASC) 5 MG tablet   Oral   Take 1 tablet (5 mg total) by mouth daily.   180 tablet   3   . aspirin 81 MG tablet   Oral   Take 1 tablet (81 mg total) by mouth daily.         Marland Kitchen atorvastatin (LIPITOR) 20 MG tablet   Oral   Take 1 tablet (20 mg total)  by mouth daily at 6 PM.   30 tablet   6   . carvedilol (COREG) 25 MG tablet   Oral   Take 1 tablet (25 mg total) by mouth 2 (two) times daily with a meal.   180 tablet   3   . clopidogrel (PLAVIX) 75 MG tablet   Oral   Take 1 tablet (75 mg total) by mouth daily with breakfast.   90 tablet   3   . furosemide (LASIX) 40 MG tablet   Oral   Take 1 tablet (40 mg total) by mouth daily as needed (for swelling).   90 tablet   3   . levothyroxine (SYNTHROID, LEVOTHROID) 125 MCG tablet   Oral   Take 125 mcg by mouth daily.         . nitroGLYCERIN (NITROSTAT) 0.4 MG SL tablet   Sublingual   Place 1 tablet (0.4 mg total) under the tongue every 5 (five) minutes as needed for chest pain.   90 tablet    1     04-21-2013------PLEASE HOLD REFILLING THIS MEDICAT ...   . olmesartan (BENICAR) 40 MG tablet   Oral   Take 1 tablet (40 mg total) by mouth daily.   15 tablet   0     Patient needing refill until [facility] sends ...   . potassium chloride (K-DUR) 10 MEQ tablet   Oral   Take 1 tablet (10 mEq total) by mouth daily.   90 tablet   3    BP 182/65  Pulse 83  Temp(Src) 99 F (37.2 C) (Oral)  Resp 24  SpO2 96% Physical Exam 0720; Physical examination:  Nursing notes reviewed; Vital signs and O2 SAT reviewed;  Constitutional: Well developed, Well nourished, Well hydrated, In no acute distress; Head:  Normocephalic, atraumatic; Eyes: EOMI, PERRL, No scleral icterus; ENMT: Mouth and pharynx normal, Mucous membranes moist; Neck: Supple, Full range of motion, No lymphadenopathy; Cardiovascular: Regular rate and rhythm, No gallop; Respiratory: Breath sounds clear & equal bilaterally, No wheezes.  Speaking full sentences with ease, Normal respiratory effort/excursion; Chest: Nontender, Movement normal; Abdomen: Soft, Nontender, Nondistended, Normal bowel sounds; Genitourinary: No CVA tenderness; Extremities: Pulses normal, No tenderness, No edema, No calf edema or asymmetry.; Neuro: AA&Ox3, vague historian. +HOH, otherwise, major CN grossly intact.  Speech clear. No gross focal motor or sensory deficits in extremities.; Skin: Color normal, Warm, Dry.   ED Course  Procedures   EKG Interpretation    Date/Time:  Friday May 16 2013 07:16:29 EST Ventricular Rate:  81 PR Interval:    QRS Duration: 81 QT Interval:  377 QTC Calculation: 438 R Axis:   41 Text Interpretation:  Normal sinus rhythm Baseline wander Artifact Possible Anterior infarct , age undetermined Left ventricular hypertrophy When compared with ECG of 08/06/2012, No significant change was found Confirmed by [facility]  MD, Nicholos Johns [phone]) on 05/16/2013 7:50:05 AM            MDM  MDM Reviewed: previous chart,  nursing note and vitals Reviewed previous: labs and ECG Interpretation: labs, ECG and x-ray   Results for orders placed during the hospital encounter of 05/16/13  CBC WITH DIFFERENTIAL      Result Value Range   WBC 19.8 (*) 4.0 - 10.5 K/uL   RBC 4.95  3.87 - 5.11 MIL/uL   Hemoglobin 14.6  12.0 - 15.0 g/dL   HCT 96.0  45.4 - 09.8 %   MCV 88.1  78.0 - 100.0 fL  MCH 29.5  26.0 - 34.0 pg   MCHC 33.5  30.0 - 36.0 g/dL   RDW 16.1  09.6 - 04.5 %   Platelets 171  150 - 400 K/uL   Neutrophils Relative % 94 (*) 43 - 77 %   Neutro Abs 18.5 (*) 1.7 - 7.7 K/uL   Lymphocytes Relative 2 (*) 12 - 46 %   Lymphs Abs 0.4 (*) 0.7 - 4.0 K/uL   Monocytes Relative 4  3 - 12 %   Monocytes Absolute 0.9  0.1 - 1.0 K/uL   Eosinophils Relative 0  0 - 5 %   Eosinophils Absolute 0.0  0.0 - 0.7 K/uL   Basophils Relative 0  0 - 1 %   Basophils Absolute 0.0  0.0 - 0.1 K/uL  BASIC METABOLIC PANEL      Result Value Range   Sodium 134 (*) 135 - 145 mEq/L   Potassium 3.6  3.5 - 5.1 mEq/L   Chloride 100  96 - 112 mEq/L   CO2 25  19 - 32 mEq/L   Glucose, Bld 134 (*) 70 - 99 mg/dL   BUN 17  6 - 23 mg/dL   Creatinine, Ser 4.09  0.50 - 1.10 mg/dL   Calcium 9.1  8.4 - 81.1 mg/dL   GFR calc non Af Amer 73 (*) >90 mL/min   GFR calc Af Amer 85 (*) >90 mL/min  TROPONIN I      Result Value Range   Troponin I <0.30  <0.30 ng/mL   Dg Chest Port 1 View 05/16/2013   CLINICAL DATA:  Chest pain and cough.  EXAM: PORTABLE CHEST - 1 VIEW  COMPARISON:  07/31/2012 and 12/29/2011  FINDINGS: Heart size is normal. Slight prominence of the pulmonary vascularity. No infiltrates or effusions. Chronic elevation of the right hemidiaphragm, unchanged since 12/29/2011. No acute osseous abnormality.  IMPRESSION: Slight pulmonary vascular prominence.   Electronically Signed   By: Geanie Cooley M.D.   On: 05/16/2013 07:43    0900:  Pt continues to deny symptoms while in the ED. Significant cardiac hx, will observation admit. No overt CHF  on CXR; BNP pending. Dx and testing d/w pt.  Questions answered.  Verb understanding, agreeable to admit. T/C to [facility] Dr. David Stall, case discussed, including:  HPI, pertinent PM/SHx, VS/PE, dx testing, ED course and treatment:  Agreeable to observation admit, requests to write temporary orders, obtain tele bed to team 10.     Laray Anger, DO 05/17/13 1521

## 2013-05-16 NOTE — H&P (Addendum)
[facility] History and Physical  Jacqueline [name] AOZ:[id number] DOB: [birth year] DOA: 05/16/2013  Referring physician: Dr. Laural Benes PCP: Warrick Parisian, MD   Chief Complaint: Chest discomfort and SOB  HPI: Jacqueline [name] is a 79 y.o. female  With past medical history of coronary artery disease status post cath that revealed severe LAD disease which was treated with a DES stent on 07/30/2012, hypertension mild aortic stenosis that comes in for chest discomfort and shortness of breath with exertion. She relates she was treated for an episode of pneumonia one week prior to admission and she was having cough. She relates her cough is persistent. She relates no fevers. She relates she wakes up in the middle of the night short of breath. No orthopnea, or lower extremities swelling which is progressively getting worse. Her primary care doctor started on Lasix which she has decreased the dose recently.  In the ED: Vitals are stable blood pressure slightly high, S. basic metabolic panel was done that shows mild hyponatremia with a pro BNP of 2000, CBC was done that shows a white count of 19 with a differential of 18. Chest x-ray was done initial mild pulmonary congestion.   Review of Systems:  Constitutional:  No weight loss, night sweats, Fevers, chills, fatigue.  HEENT:  No headaches, Difficulty swallowing,Tooth/dental problems,Sore throat,  No sneezing, itching, ear ache, nasal congestion, post nasal drip,  Cardio-vascular:  No chest pain, Orthopnea, anasarca, dizziness, palpitations  GI:  No heartburn, indigestion, abdominal pain, nausea, vomiting, diarrhea, change in bowel habits, loss of appetite  Resp:  No excess mucus, no productive cough, No non-productive cough, No coughing up of blood.No change in color of mucus.No wheezing.No chest wall deformity  Skin:  no rash or lesions.  GU:  no dysuria, change in color of urine, no urgency or frequency. No flank pain.  Musculoskeletal:  No  joint pain or swelling. No decreased range of motion. No back pain.  Psych:  No change in mood or affect. No depression or anxiety. No memory loss.   Past Medical History  Diagnosis Date  . Hypertension   . Mild aortic stenosis   . Diabetes mellitus   . Dizziness   . Gout   . Hypothyroidism   . CAD (coronary artery disease)     a. 2-08/2012: Cath/PCI: LM 10-20d, LAD 60-70p, 88m (Rotablator->2.5x32 Promus Premier DES), D1 70-80ost, D2- small-90 ost/95p, LCX large, OM1 40p, OM2 small 80-90, RCA dominant 29m, Ef 65-70%.  . Bilateral leg weakness     chronic  . Peripheral edema   . Unstable angina    Past Surgical History  Procedure Laterality Date  . Left knee replacement    . Breast biopsies    . Cataract extraction    . Coronary angioplasty with stent placement  march 2014   Social History:  reports that she has never smoked. She does not have any smokeless tobacco history on file. She reports that she does not drink alcohol or use illicit drugs.  No Known Allergies  Family History  Problem Relation Age of Onset  . Alzheimer's disease Brother   . Heart disease Father 7     Prior to Admission medications   Medication Sig Start Date End Date Taking? Authorizing Provider  acetaminophen (TYLENOL) 325 MG tablet Take 650 mg by mouth every 6 (six) hours as needed for pain.   Yes Historical Provider, MD  Ascorbic Acid (VITAMIN C PO) Take 1 tablet by mouth daily as needed (cold).   Yes  Historical Provider, MD  aspirin EC 81 MG tablet Take 81 mg by mouth daily.   Yes Historical Provider, MD  atorvastatin (LIPITOR) 20 MG tablet Take 20 mg by mouth daily at 6 PM.   Yes Historical Provider, MD  carvedilol (COREG) 25 MG tablet Take 25 mg by mouth 2 (two) times daily with a meal.   Yes Historical Provider, MD  clopidogrel (PLAVIX) 75 MG tablet Take 75 mg by mouth daily with breakfast.   Yes Historical Provider, MD  furosemide (LASIX) 40 MG tablet Take 1 tablet (40 mg total) by mouth daily  as needed (for swelling). 12/05/12  Yes Peter M [place], MD  levothyroxine (SYNTHROID, LEVOTHROID) 125 MCG tablet Take 125 mcg by mouth daily.   Yes Historical Provider, MD  nitroGLYCERIN (NITROSTAT) 0.4 MG SL tablet Place 0.4 mg under the tongue every 5 (five) minutes as needed for chest pain.   Yes Historical Provider, MD  olmesartan (BENICAR) 40 MG tablet Take 40 mg by mouth daily.   Yes Historical Provider, MD  potassium chloride (K-DUR) 10 MEQ tablet Take 10 mEq by mouth daily.   Yes Historical Provider, MD   Physical Exam: Filed Vitals:   05/16/13 1200  BP: 164/58  Pulse: 77  Temp: 99.5 F (37.5 C)  Resp: 20    BP 164/58  Pulse 77  Temp(Src) 99.5 F (37.5 C) (Oral)  Resp 20  SpO2 97%  BP 164/58  Pulse 77  Temp(Src) 99.5 F (37.5 C) (Oral)  Resp 20  SpO2 97%  General Appearance:    Alert, cooperative, no distress, appears stated age              Throat:   Lips, mucosa, and tongue normal;  Neck:   Supple, symmetrical, trachea midline, no adenopathy;    thyroid:  + JVD, + HJ reflex.     Lungs:    moderate air movement with crackles in the right upper lung       Heart:    Regular rate and rhythm, S1 and S2 normal, no murmur, rub   or gallop     Abdomen:     Soft, non-tender, bowel sounds active all four quadrants,    no masses, no organomegaly        Extremities:   Extremities normal, atraumatic, no cyanosis or edema  Pulses:   2+ and symmetric all extremities  Skin:   Skin color, texture, turgor normal, no rashes or lesions  Lymph nodes:   Cervical, supraclavicular, and axillary nodes normal  Neurologic:   CNII-XII intact, normal strength, sensation and reflexes    throughout             Labs on Admission:  Basic Metabolic Panel:  Recent Labs Lab 05/16/13 0730  NA 134*  K 3.6  CL 100  CO2 25  GLUCOSE 134*  BUN 17  CREATININE 0.66  CALCIUM 9.1   Liver Function Tests: No results found for this basename: AST, ALT, ALKPHOS, BILITOT, PROT, ALBUMIN,   in the last 168 hours No results found for this basename: LIPASE, AMYLASE,  in the last 168 hours No results found for this basename: AMMONIA,  in the last 168 hours CBC:  Recent Labs Lab 05/16/13 0730  WBC 19.8*  NEUTROABS 18.5*  HGB 14.6  HCT 43.6  MCV 88.1  PLT 171   Cardiac Enzymes:  Recent Labs Lab 05/16/13 0730  TROPONINI <0.30    BNP (last 3 results)  Recent Labs  07/31/12 0535 05/16/13  0730  PROBNP 1371.0* 1997.0*   CBG: No results found for this basename: GLUCAP,  in the last 168 hours  Radiological Exams on Admission: Dg Chest Port 1 View  05/16/2013   CLINICAL DATA:  Chest pain and cough.  EXAM: PORTABLE CHEST - 1 VIEW  COMPARISON:  07/31/2012 and 12/29/2011  FINDINGS: Heart size is normal. Slight prominence of the pulmonary vascularity. No infiltrates or effusions. Chronic elevation of the right hemidiaphragm, unchanged since 12/29/2011. No acute osseous abnormality.  IMPRESSION: Slight pulmonary vascular prominence.   Electronically Signed   By: Geanie Cooley M.D.   On: 05/16/2013 07:43    EKG: Independently reviewed. Normal sinus rhythm nonspecific T wave changes.  Assessment/Plan  Acute systolic heart failure - I will go ahead and start her on IV Lasix, check a 2-D echo. Check basic metabolic panel daily, monitor strict I.'s and O's and daily weights. Replete electrolytes as needed. - Her primary care doctor has told her to decrease her Lasix dose gradually she is only now taking it every other day. Which is the most likely cause of her exacerbation.  Leukocytosis: -Unclear etiology, although I will get blood cultures x2. She has remained afebrile. She does have an infiltrate on the right upper lung. At home she was on Levaquin. She has completed a seven-day course. - Check a UA.  Hypertension: - Mildly high most likely due to volume overload, continue Lasix at low dose ACE, and continue beta blockers.  Atypical chest pain - Chest discomfort that  she describes sounds like pressure percent of cardiac enzymes negative. Her EKG shows nonspecific T wave changes with normal sinus rhythm, probable LVH. This could be due to the pulmonary vascular congestion. - Cycle cardiac enzymes.   Mild aortic stenosis - Repeat echocardiogram.  Code Status: full Family Communication: none Disposition Plan: inpatient  Time spent: 75 minutes  Marinda Elk [facility] Pager [phone]

## 2013-05-16 NOTE — ED Notes (Signed)
Report given to RN Rey x [phone].  Pt will be transported to the floor once pt is pain free.

## 2013-05-16 NOTE — Progress Notes (Signed)
ANTIBIOTIC CONSULT NOTE - INITIAL  Pharmacy Consult for rocephin Indication: pneumonia  No Known Allergies  Patient Measurements: Height: 5\' 4"  (162.6 cm) Weight: 183 lb 13.8 oz (83.4 kg) IBW/kg (Calculated) : 54.7 Adjusted Body Weight:   Vital Signs: Temp: 98.9 F (37.2 C) (12/12 1400) Temp src: Oral (12/12 1400) BP: 154/67 mmHg (12/12 1400) Pulse Rate: 78 (12/12 1400) Intake/Output from previous day:   Intake/Output from this shift: Total I/O In: -  Out: 300 [Urine:300]  Labs:  Recent Labs  05/16/13 0730 05/16/13 1413  WBC 19.8* 20.1*  HGB 14.6 14.3  PLT 171 149*  CREATININE 0.66 0.63   Estimated Creatinine Clearance: 44.9 ml/min (by C-G formula based on Cr of 0.63). No results found for this basename: VANCOTROUGH, VANCOPEAK, VANCORANDOM, GENTTROUGH, GENTPEAK, GENTRANDOM, TOBRATROUGH, TOBRAPEAK, TOBRARND, AMIKACINPEAK, AMIKACINTROU, AMIKACIN,  in the last 72 hours   Microbiology: No results found for this or any previous visit (from the past 720 hour(s)).  Medical History: Past Medical History  Diagnosis Date  . Hypertension   . Mild aortic stenosis     a. 01/2010 Echo: Nl EF, mild AS/AI, mild LAE, mild PAH.  . Diabetes mellitus   . Gout   . Hypothyroidism   . CAD (coronary artery disease)     a. 2-08/2012: Cath/PCI: LM 10-20d, LAD 60-70p, 41m (Rotablator->2.5x32 Promus Premier DES), D1 70-80ost, D2- small-90 ost/95p, LCX large, OM1 40p, OM2 small 80-90, RCA dominant 38m, Ef 65-70%.  . Bilateral leg weakness     chronic  . Hyperlipidemia     Medications:  Prescriptions prior to admission  Medication Sig Dispense Refill  . acetaminophen (TYLENOL) 325 MG tablet Take 650 mg by mouth every 6 (six) hours as needed for pain.      . Ascorbic Acid (VITAMIN C PO) Take 1 tablet by mouth daily as needed (cold).      Marland Kitchen aspirin EC 81 MG tablet Take 81 mg by mouth daily.      Marland Kitchen atorvastatin (LIPITOR) 20 MG tablet Take 20 mg by mouth daily at 6 PM.      . carvedilol  (COREG) 25 MG tablet Take 25 mg by mouth 2 (two) times daily with a meal.      . clopidogrel (PLAVIX) 75 MG tablet Take 75 mg by mouth daily with breakfast.      . furosemide (LASIX) 40 MG tablet Take 1 tablet (40 mg total) by mouth daily as needed (for swelling).  90 tablet  3  . levothyroxine (SYNTHROID, LEVOTHROID) 125 MCG tablet Take 125 mcg by mouth daily.      . nitroGLYCERIN (NITROSTAT) 0.4 MG SL tablet Place 0.4 mg under the tongue every 5 (five) minutes as needed for chest pain.      Marland Kitchen olmesartan (BENICAR) 40 MG tablet Take 40 mg by mouth daily.      . potassium chloride (K-DUR) 10 MEQ tablet Take 10 mEq by mouth daily.       Scheduled:  . [START ON 05/17/2013] aspirin EC  81 mg Oral Daily  . atorvastatin  20 mg Oral q1800  . carvedilol  25 mg Oral BID WC  . cefTRIAXone (ROCEPHIN)  IV  1 g Intravenous Q24H  . [START ON 05/17/2013] clopidogrel  75 mg Oral Q breakfast  . furosemide  40 mg Intravenous BID  . heparin  5,000 Units Subcutaneous Q8H  . [START ON 05/17/2013] irbesartan  75 mg Oral Daily  . [START ON 05/17/2013] levothyroxine  125 mcg Oral QAC breakfast  . [  START ON 05/17/2013] potassium chloride  10 mEq Oral Daily  . potassium chloride  40 mEq Oral BID  . sodium chloride  3 mL Intravenous Q12H   Assessment: Pt starting on rocephin for poss URI or cellulitis. Rocephin does not need to be adjusted for renal. Rx will enter dose and sign off.    Plan:  Rocephin 1g IV q24 Rx sign off  Ulyses Southward, PharmD Pager: [phone] 05/16/2013 7:02 PM

## 2013-05-16 NOTE — ED Notes (Signed)
Per [facility], pt from [facility] in [place] for chest pressure since last night. No other symptoms. Woke up this this morning and pressure was still there so was still worried. Had a stent placed in March with 95 % blockage. Pt took 2 NTG this morning and EMS gave 1 NTG and 324 mg ASA. Unable to obtain IV access. Does have hx of CHF and has bilateral dependent edema. Rates pain 2/10. 95 % on RA

## 2013-05-16 NOTE — ED Notes (Signed)
Pt states she feels nauseated 

## 2013-05-16 NOTE — ED Notes (Signed)
Meal tray ordered. Low sodium

## 2013-05-16 NOTE — Progress Notes (Addendum)
CRITICAL VALUE ALERT  Critical value received:  TROPONIN=0.65  Date of notification:  05/16/2013  Time of notification: 1645  Critical value read back:YES  Nurse who received alert:  Eduardo Osier, RN  MD notified (1st page):  DR. David Stall  Time of first page:  1645  MD notified (2nd page):  Time of second page:  Responding MD:  DR. David Stall  Time MD responded:  1646- NO NEW ORDERS RECEIVED, MD STATED TO KEEP FOLLOWING TROPONINS

## 2013-05-16 NOTE — Care Management Note (Unsigned)
    Page 1 of 1   05/16/2013     3:08:32 PM   CARE MANAGEMENT NOTE 05/16/2013  Patient:  Jacqueline [name], Jacqueline [name]   Account Number:  [id number]  Date Initiated:  05/16/2013  Documentation initiated by:  GRAVES-BIGELOW,Mikailah Morel  Subjective/Objective Assessment:   Pt admitted for CP and SOB. Admitted from [facility] in [place] . Initiated on IV lasix.     Action/Plan:   CM will continue to monitor for disposition needs.   Anticipated DC Date:  05/20/2013   Anticipated DC Plan:  SKILLED NURSING FACILITY  In-house referral  Clinical Social Worker         Choice offered to / List presented to:             Status of service:  In process, will continue to follow Medicare Important Message given?   (If response is "NO", the following Medicare IM given date fields will be blank) Date Medicare IM given:   Date Additional Medicare IM given:    Discharge Disposition:    Per UR Regulation:  Reviewed for med. necessity/level of care/duration of stay  If discussed at Long Length of Stay Meetings, dates discussed:    Comments:

## 2013-05-16 NOTE — Consult Note (Signed)
HPI: 79 year old female for evaluation of chest pain. Patient does have a history of coronary artery disease. She had a cardiac catheterization in February of 2014 that showed a 10-20% left main, 60-70% proximal LAD followed by a 95% lesion. She had a 70-80% ostial first diagonal and a very small second diagonal with a 95% stenosis. There was a small marginal branch with an 80-90% stenosis. Ejection fraction 65-70%. Patient had PCI of the LAD with rotational atherectomy followed by drug-eluting stent. Patient is a very poor historian. She states recently she has had a cough productive of white sputum. She denies fevers or chills. No hemoptysis. She also has had pain in the left chest and back. It is described as a dull sensation and dry. The pain increases with cough by her report. She is unclear whether she feels dyspneic. She notes chronic pedal edema that is worse. She recently received a course of antibiotics for possible URI. Admitted by primary care and initiated on IV diuretics. Troponin mildly elevated. Cardiology asked to evaluate.  Medications Prior to Admission  Medication Sig Dispense Refill  . acetaminophen (TYLENOL) 325 MG tablet Take 650 mg by mouth every 6 (six) hours as needed for pain.      . Ascorbic Acid (VITAMIN C PO) Take 1 tablet by mouth daily as needed (cold).      Marland Kitchen aspirin EC 81 MG tablet Take 81 mg by mouth daily.      Marland Kitchen atorvastatin (LIPITOR) 20 MG tablet Take 20 mg by mouth daily at 6 PM.      . carvedilol (COREG) 25 MG tablet Take 25 mg by mouth 2 (two) times daily with a meal.      . clopidogrel (PLAVIX) 75 MG tablet Take 75 mg by mouth daily with breakfast.      . furosemide (LASIX) 40 MG tablet Take 1 tablet (40 mg total) by mouth daily as needed (for swelling).  90 tablet  3  . levothyroxine (SYNTHROID, LEVOTHROID) 125 MCG tablet Take 125 mcg by mouth daily.      . nitroGLYCERIN (NITROSTAT) 0.4 MG SL tablet Place 0.4 mg under the tongue every 5 (five) minutes as  needed for chest pain.      Marland Kitchen olmesartan (BENICAR) 40 MG tablet Take 40 mg by mouth daily.      . potassium chloride (K-DUR) 10 MEQ tablet Take 10 mEq by mouth daily.        No Known Allergies  Past Medical History  Diagnosis Date  . Hypertension   . Mild aortic stenosis     a. 01/2010 Echo: Nl EF, mild AS/AI, mild LAE, mild PAH.  . Diabetes mellitus   . Gout   . Hypothyroidism   . CAD (coronary artery disease)     a. 2-08/2012: Cath/PCI: LM 10-20d, LAD 60-70p, 80m (Rotablator->2.5x32 Promus Premier DES), D1 70-80ost, D2- small-90 ost/95p, LCX large, OM1 40p, OM2 small 80-90, RCA dominant 59m, Ef 65-70%.  . Bilateral leg weakness     chronic  . Hyperlipidemia     Past Surgical History  Procedure Laterality Date  . Left knee replacement    . Breast biopsies    . Cataract extraction    . Coronary angioplasty with stent placement  march 2014    History   Social History  . Marital Status: Widowed    Spouse Name: N/A    Number of Children: 2  . Years of Education: N/A   Occupational History  . Not  on file.   Social History Main Topics  . Smoking status: Never Smoker   . Smokeless tobacco: Not on file  . Alcohol Use: No  . Drug Use: No  . Sexual Activity: Not Currently   Other Topics Concern  . Not on file   Social History Narrative   Lives in assisted living alone.      Family History  Problem Relation Age of Onset  . Alzheimer's disease Brother   . Heart disease Father 80    ROS:  no fevers or chills, hemoptysis, dysphasia, odynophagia, melena, hematochezia, dysuria, hematuria, rash, seizure activity, orthopnea, PND, claudication. Remaining systems are negative.  Physical Exam:   Blood pressure 154/67, pulse 78, temperature 98.9 F (37.2 C), temperature source Oral, resp. rate 18, weight 183 lb 13.8 oz (83.4 kg), SpO2 96.00%.  General:  Well developed/obese in NAD Skin warm/dry Patient not depressed No peripheral  clubbing Back-normal HEENT-normal/normal eyelids Neck supple/normal carotid upstroke bilaterally; no JVD; no thyromegaly chest - mild basilar crackles CV - RRR/normal S1 and S2; no rubs or gallops;  PMI nondisplaced; 3/6 systolic murmur Abdomen -NT/ND, no HSM, no mass, + bowel sounds, no bruit 2+ femoral pulses, no bruits Ext-1-2 edema, no chords, 2+ DP; chronic skin changes; positive erythema RLE Neuro-grossly nonfocal  ECG sinus rhythm, first degree AV block, nonspecific ST changes.  Results for orders placed during the hospital encounter of 05/16/13 (from the past 48 hour(s))  CBC WITH DIFFERENTIAL     Status: Abnormal   Collection Time    05/16/13  7:30 AM      Result Value Range   WBC 19.8 (*) 4.0 - 10.5 K/uL   RBC 4.95  3.87 - 5.11 MIL/uL   Hemoglobin 14.6  12.0 - 15.0 g/dL   HCT 13.0  86.5 - 78.4 %   MCV 88.1  78.0 - 100.0 fL   MCH 29.5  26.0 - 34.0 pg   MCHC 33.5  30.0 - 36.0 g/dL   RDW 69.6  29.5 - 28.4 %   Platelets 171  150 - 400 K/uL   Neutrophils Relative % 94 (*) 43 - 77 %   Neutro Abs 18.5 (*) 1.7 - 7.7 K/uL   Lymphocytes Relative 2 (*) 12 - 46 %   Lymphs Abs 0.4 (*) 0.7 - 4.0 K/uL   Monocytes Relative 4  3 - 12 %   Monocytes Absolute 0.9  0.1 - 1.0 K/uL   Eosinophils Relative 0  0 - 5 %   Eosinophils Absolute 0.0  0.0 - 0.7 K/uL   Basophils Relative 0  0 - 1 %   Basophils Absolute 0.0  0.0 - 0.1 K/uL  BASIC METABOLIC PANEL     Status: Abnormal   Collection Time    05/16/13  7:30 AM      Result Value Range   Sodium 134 (*) 135 - 145 mEq/L   Potassium 3.6  3.5 - 5.1 mEq/L   Chloride 100  96 - 112 mEq/L   CO2 25  19 - 32 mEq/L   Glucose, Bld 134 (*) 70 - 99 mg/dL   BUN 17  6 - 23 mg/dL   Creatinine, Ser 1.32  0.50 - 1.10 mg/dL   Calcium 9.1  8.4 - 44.0 mg/dL   GFR calc non Af Amer 73 (*) >90 mL/min   GFR calc Af Amer 85 (*) >90 mL/min   Comment: (NOTE)     The eGFR has been calculated using the CKD EPI equation.  This calculation has not been  validated in all clinical situations.     eGFR's persistently <90 mL/min signify possible Chronic Kidney     Disease.  TROPONIN I     Status: None   Collection Time    05/16/13  7:30 AM      Result Value Range   Troponin I <0.30  <0.30 ng/mL   Comment:            Due to the release kinetics of cTnI,     a negative result within the first hours     of the onset of symptoms does not rule out     myocardial infarction with certainty.     If myocardial infarction is still suspected,     repeat the test at appropriate intervals.  PRO B NATRIURETIC PEPTIDE     Status: Abnormal   Collection Time    05/16/13  7:30 AM      Result Value Range   Pro B Natriuretic peptide (BNP) 1997.0 (*) 0 - 450 pg/mL  CBC WITH DIFFERENTIAL     Status: Abnormal   Collection Time    05/16/13  2:13 PM      Result Value Range   WBC 20.1 (*) 4.0 - 10.5 K/uL   RBC 4.69  3.87 - 5.11 MIL/uL   Hemoglobin 14.3  12.0 - 15.0 g/dL   HCT 96.0  45.4 - 09.8 %   MCV 89.8  78.0 - 100.0 fL   MCH 30.5  26.0 - 34.0 pg   MCHC 34.0  30.0 - 36.0 g/dL   RDW 11.9  14.7 - 82.9 %   Platelets 149 (*) 150 - 400 K/uL   Neutrophils Relative % 92 (*) 43 - 77 %   Neutro Abs 18.6 (*) 1.7 - 7.7 K/uL   Lymphocytes Relative 3 (*) 12 - 46 %   Lymphs Abs 0.7  0.7 - 4.0 K/uL   Monocytes Relative 4  3 - 12 %   Monocytes Absolute 0.9  0.1 - 1.0 K/uL   Eosinophils Relative 0  0 - 5 %   Eosinophils Absolute 0.0  0.0 - 0.7 K/uL   Basophils Relative 0  0 - 1 %   Basophils Absolute 0.0  0.0 - 0.1 K/uL  CREATININE, SERUM     Status: Abnormal   Collection Time    05/16/13  2:13 PM      Result Value Range   Creatinine, Ser 0.63  0.50 - 1.10 mg/dL   GFR calc non Af Amer 74 (*) >90 mL/min   GFR calc Af Amer 86 (*) >90 mL/min   Comment: (NOTE)     The eGFR has been calculated using the CKD EPI equation.     This calculation has not been validated in all clinical situations.     eGFR's persistently <90 mL/min signify possible Chronic Kidney      Disease.  TROPONIN I     Status: Abnormal   Collection Time    05/16/13  3:15 PM      Result Value Range   Troponin I 0.65 (*) <0.30 ng/mL   Comment:            Due to the release kinetics of cTnI,     a negative result within the first hours     of the onset of symptoms does not rule out     myocardial infarction with certainty.     If myocardial infarction is still suspected,  repeat the test at appropriate intervals.     REPEATED TO VERIFY     CRITICAL RESULT CALLED TO, READ BACK BY AND VERIFIED WITH:     Ralene Muskrat 1642 05/16/13 D BRADLEY    Dg Chest Port 1 View  05/16/2013   CLINICAL DATA:  Chest pain and cough.  EXAM: PORTABLE CHEST - 1 VIEW  COMPARISON:  07/31/2012 and 12/29/2011  FINDINGS: Heart size is normal. Slight prominence of the pulmonary vascularity. No infiltrates or effusions. Chronic elevation of the right hemidiaphragm, unchanged since 12/29/2011. No acute osseous abnormality.  IMPRESSION: Slight pulmonary vascular prominence.   Electronically Signed   By: Geanie Cooley M.D.   On: 05/16/2013 07:43    Assessment/Plan 1 chest pain-history is extremely difficult. She appears to have a URI and she states her chest pain increases with cough. However troponin minimally elevated. I would favor continuing to cycle enzymes to look for trend and if no further trend up, continue medical therapy. Continue ASA, plavix, statin and coreg. 2 Acute diastolic CHF-mildly volume overloaded; agree with lasix and follow renal function. Recheck BMET in AM. Check echo for LV function. 3 URI-would treat with antibiotics. ? LE cellulitis. Will add rocephin. 4 CAD-Continue ASA, plavix and statin. 5 Significant AS on exam-repeat echo. 6 Continue present meds and follow.   Olga Millers MD 05/16/2013, 6:36 PM

## 2013-05-17 ENCOUNTER — Inpatient Hospital Stay (HOSPITAL_COMMUNITY): Payer: Medicare Other

## 2013-05-17 DIAGNOSIS — I359 Nonrheumatic aortic valve disorder, unspecified: Secondary | ICD-10-CM

## 2013-05-17 DIAGNOSIS — I214 Non-ST elevation (NSTEMI) myocardial infarction: Secondary | ICD-10-CM

## 2013-05-17 DIAGNOSIS — I2510 Atherosclerotic heart disease of native coronary artery without angina pectoris: Secondary | ICD-10-CM

## 2013-05-17 LAB — BASIC METABOLIC PANEL
BUN: 14 mg/dL (ref 6–23)
CO2: 29 mEq/L (ref 19–32)
Calcium: 8.4 mg/dL (ref 8.4–10.5)
Creatinine, Ser: 0.85 mg/dL (ref 0.50–1.10)
GFR calc non Af Amer: 57 mL/min — ABNORMAL LOW (ref >90)
Glucose, Bld: 111 mg/dL — ABNORMAL HIGH (ref 70–99)
Potassium: 3.7 mEq/L (ref 3.5–5.1)
Sodium: 134 mEq/L — ABNORMAL LOW (ref 135–145)

## 2013-05-17 LAB — CBC
Hemoglobin: 13.6 g/dL (ref 12.0–15.0)
MCH: 30 pg (ref 26.0–34.0)
MCV: 89.4 fL (ref 78.0–100.0)
Platelets: 144 10*3/uL — ABNORMAL LOW (ref 150–400)
RBC: 4.54 MIL/uL (ref 3.87–5.11)
WBC: 9.8 10*3/uL (ref 4.0–10.5)

## 2013-05-17 LAB — URINALYSIS, ROUTINE W REFLEX MICROSCOPIC
Bilirubin Urine: NEGATIVE
Glucose, UA: NEGATIVE mg/dL
Ketones, ur: NEGATIVE mg/dL
Specific Gravity, Urine: 1.005 (ref 1.005–1.030)
Urobilinogen, UA: 0.2 mg/dL (ref 0.0–1.0)

## 2013-05-17 LAB — TROPONIN I: Troponin I: 4.4 ng/mL (ref <0.30)

## 2013-05-17 LAB — URINE MICROSCOPIC-ADD ON

## 2013-05-17 MED ORDER — CLINDAMYCIN PHOSPHATE 600 MG/50ML IV SOLN
600.0000 mg | Freq: Three times a day (TID) | INTRAVENOUS | Status: DC
  Administered 2013-05-17 – 2013-05-18 (×4): 600 mg via INTRAVENOUS
  Filled 2013-05-17 (×6): qty 50

## 2013-05-17 MED ORDER — HEPARIN BOLUS VIA INFUSION
2000.0000 [IU] | Freq: Once | INTRAVENOUS | Status: AC
  Administered 2013-05-17: 2000 [IU] via INTRAVENOUS
  Filled 2013-05-17: qty 2000

## 2013-05-17 MED ORDER — HEPARIN (PORCINE) IN NACL 100-0.45 UNIT/ML-% IJ SOLN
1100.0000 [IU]/h | INTRAMUSCULAR | Status: DC
  Filled 2013-05-17: qty 250

## 2013-05-17 MED ORDER — HEPARIN (PORCINE) IN NACL 100-0.45 UNIT/ML-% IJ SOLN
850.0000 [IU]/h | INTRAMUSCULAR | Status: DC
  Administered 2013-05-17: 850 [IU]/h via INTRAVENOUS
  Filled 2013-05-17: qty 250

## 2013-05-17 MED ORDER — HEPARIN BOLUS VIA INFUSION
3000.0000 [IU] | Freq: Once | INTRAVENOUS | Status: AC
  Administered 2013-05-17: 3000 [IU] via INTRAVENOUS
  Filled 2013-05-17: qty 3000

## 2013-05-17 MED ORDER — PNEUMOCOCCAL VAC POLYVALENT 25 MCG/0.5ML IJ INJ
0.5000 mL | INJECTION | INTRAMUSCULAR | Status: DC
  Filled 2013-05-17: qty 0.5

## 2013-05-17 NOTE — Progress Notes (Signed)
Subjective:  Doing well. Burping. No CP. No SOB.   Objective:  Vital Signs in the last 24 hours: Temp:  [98.9 F (37.2 C)-100.4 F (38 C)] 100.2 F (37.9 C) (12/13 0513) Pulse Rate:  [69-78] 69 (12/13 0513) Resp:  [18] 18 (12/13 0513) BP: (129-154)/(47-67) 131/51 mmHg (12/13 0513) SpO2:  [94 %-97 %] 94 % (12/13 0513) Weight:  [181 lb 7 oz (82.3 kg)] 181 lb 7 oz (82.3 kg) (12/13 0513)  Intake/Output from previous day: 12/12 0701 - 12/13 0700 In: 3 [I.V.:3] Out: 300 [Urine:300]  . aspirin EC  81 mg Oral Daily  . atorvastatin  20 mg Oral q1800  . carvedilol  25 mg Oral BID WC  . cefTRIAXone (ROCEPHIN)  IV  1 g Intravenous Q24H  . clindamycin (CLEOCIN) IV  600 mg Intravenous Q8H  . clopidogrel  75 mg Oral Q breakfast  . furosemide  40 mg Intravenous BID  . irbesartan  75 mg Oral Daily  . levothyroxine  125 mcg Oral QAC breakfast  . potassium chloride  10 mEq Oral Daily  . sodium chloride  3 mL Intravenous Q12H    Physical Exam: General: Elderly, Well developed, well nourished, in no acute distress. Head:  Normocephalic and atraumatic. Lungs: Clear to auscultation and percussion. Heart: Normal S1 and S2.  2/6 S murmur, no rubs or gallops.  Abdomen: soft, non-tender, positive bowel sounds. Extremities: No clubbing or cyanosis. Chronic appearing edema. Minor erythema.  Neurologic: Alert and oriented x 3.    Lab Results:  Recent Labs  05/16/13 1413 05/17/13 0955  WBC 20.1* 9.8  HGB 14.3 13.6  PLT 149* 144*    Recent Labs  05/16/13 0730 05/16/13 1413 05/17/13 0215  NA 134*  --  134*  K 3.6  --  3.7  CL 100  --  97  CO2 25  --  29  GLUCOSE 134*  --  111*  BUN 17  --  14  CREATININE 0.66 0.63 0.85    Recent Labs  05/16/13 1930 05/17/13 0215  TROPONINI 2.35* 4.40*    Imaging: Dg Chest Port 1 View  05/16/2013   CLINICAL DATA:  Chest pain and cough.  EXAM: PORTABLE CHEST - 1 VIEW  COMPARISON:  07/31/2012 and 12/29/2011  FINDINGS: Heart size is  normal. Slight prominence of the pulmonary vascularity. No infiltrates or effusions. Chronic elevation of the right hemidiaphragm, unchanged since 12/29/2011. No acute osseous abnormality.  IMPRESSION: Slight pulmonary vascular prominence.   Electronically Signed   By: Geanie Cooley M.D.   On: 05/16/2013 07:43   Dg Foot Complete Right  05/17/2013   CLINICAL DATA:  Fourth toe swelling  EXAM: RIGHT FOOT COMPLETE - 3+ VIEW  COMPARISON:  None.  FINDINGS: No acute fracture dislocation is noted. Diffuse degenerative changes are noted in the interphalangeal joints. Mild soft tissue swelling is seen. No definitive erosive changes to suggest osteomyelitis are noted.  IMPRESSION: Degenerative change without acute abnormality.   Electronically Signed   By: Alcide Clever M.D.   On: 05/17/2013 09:47   Personally viewed.   Telemetry: No adverse rhythms.  Personally viewed.   Cardiac Studies:  ECHO pending.   Assessment/Plan:  Principal Problem:   NSTEMI (non-ST elevated myocardial infarction) Active Problems:   Hypertension   CAD (coronary artery disease)   Mild aortic stenosis   Hypothyroidism   Leukocytosis, unspecified  1) NSTEMI - continue with IV heparin today with other medical management. Plavix, ASA, atorvastatin. Excellent. Non-invasive management.  Peak troponin 4. No signs of bleeding. Tomorrow will DC IV heparin.   2) CAD - She had a cardiac catheterization in February of 2014 that showed a 10-20% left main, 60-70% proximal LAD followed by a 95% lesion. She had a 70-80% ostial first diagonal and a very small second diagonal with a 95% stenosis. There was a small marginal branch with an 80-90% stenosis. Ejection fraction 65-70%. Patient had PCI of the LAD with rotational atherectomy followed by drug-eluting stent.  3) Acute diastolic HF - doing better with IV lasix. Tomorrow would change to PO. Creat 0.6.   4) Heart murmur - await ECHO. ? AS.     SKAINS, MARK 05/17/2013, 12:46  PM

## 2013-05-17 NOTE — Progress Notes (Signed)
[facility] PROGRESS NOTE  MAYA ARCAND ZOX:[id number] DOB: 1917/04/23 DOA: 05/16/2013 PCP: Warrick Parisian, MD  Assessment/Plan: 1-Acute systolic heart failure;  -Continue with lasix 40 mg IV BID.  -Strict I and O, daily weight.  -Dyspnea improved.   2-NSTEMI:  -Continue with medical management.  -Heparin gtt, plavix, aspirin, Coreg. Avapro. -PRN nitroglycerin.  -Cardiology following.  -Troponin peak to 4.4. -ECHO ordered.   3-Leukocytosis: -Could be secondary to right lower extremity cellulitis, vs ? PNA, NSTEMI. Although chest x ray only show pulmonary vascular prominence.  -Check x ray right foot attention 4 toe.   -UA negative.  -will order Blood culture.   4-Hypertension:  -continue Lasix at low dose ACE, and continue beta blockers.  Mild aortic stenosis -ECHO pending.    Code Status: Full Code.  Family Communication: Care discussed with patient.  Disposition Plan: Remain inpatient. To be determine. Patient lives independent facility.    Consultants:  Cardiology.   Procedures:  ECHO; pending.   Antibiotics:  Ceftriaxone 12-12  Clindamycin 12-13  HPI/Subjective: Patient feeling better today. No chest pain this morning. She is breathing better.  She was diagnosed with cellulitis right LE and 4 toe infection 2 month ago by PCP. She has right 4 toe pain on and off.   Objective: Filed Vitals:   05/17/13 0513  BP: 131/51  Pulse: 69  Temp: 100.2 F (37.9 C)  Resp: 18    Intake/Output Summary (Last 24 hours) at 05/17/13 0846 Last data filed at 05/17/13 8119  Gross per 24 hour  Intake    303 ml  Output    300 ml  Net      3 ml   Filed Weights   05/16/13 1200 05/17/13 0513  Weight: 83.4 kg (183 lb 13.8 oz) 82.3 kg (181 lb 7 oz)    Exam:   General:  No distress.   Cardiovascular: S 1, S 2 RRR, systolic murmur.   Respiratory: no wheezes, few crackles bases.   Abdomen: Bs present, soft, NT  Musculoskeletal: bilateral lower  extremity edema, right lower extremity less red per nurse report, right 4 toe with swelling.   Data Reviewed: Basic Metabolic Panel:  Recent Labs Lab 05/16/13 0730 05/16/13 1413 05/17/13 0215  NA 134*  --  134*  K 3.6  --  3.7  CL 100  --  97  CO2 25  --  29  GLUCOSE 134*  --  111*  BUN 17  --  14  CREATININE 0.66 0.63 0.85  CALCIUM 9.1  --  8.4   Liver Function Tests: No results found for this basename: AST, ALT, ALKPHOS, BILITOT, PROT, ALBUMIN,  in the last 168 hours No results found for this basename: LIPASE, AMYLASE,  in the last 168 hours No results found for this basename: AMMONIA,  in the last 168 hours CBC:  Recent Labs Lab 05/16/13 0730 05/16/13 1413  WBC 19.8* 20.1*  NEUTROABS 18.5* 18.6*  HGB 14.6 14.3  HCT 43.6 42.1  MCV 88.1 89.8  PLT 171 149*   Cardiac Enzymes:  Recent Labs Lab 05/16/13 0730 05/16/13 1515 05/16/13 1930 05/17/13 0215  TROPONINI <0.30 0.65* 2.35* 4.40*   BNP (last 3 results)  Recent Labs  07/31/12 0535 05/16/13 0730  PROBNP 1371.0* 1997.0*   CBG: No results found for this basename: GLUCAP,  in the last 168 hours  No results found for this or any previous visit (from the past 240 hour(s)).   Studies: Dg Chest Port 1 View  05/16/2013  CLINICAL DATA:  Chest pain and cough.  EXAM: PORTABLE CHEST - 1 VIEW  COMPARISON:  07/31/2012 and 12/29/2011  FINDINGS: Heart size is normal. Slight prominence of the pulmonary vascularity. No infiltrates or effusions. Chronic elevation of the right hemidiaphragm, unchanged since 12/29/2011. No acute osseous abnormality.  IMPRESSION: Slight pulmonary vascular prominence.   Electronically Signed   By: Geanie Cooley M.D.   On: 05/16/2013 07:43    Scheduled Meds: . aspirin EC  81 mg Oral Daily  . atorvastatin  20 mg Oral q1800  . carvedilol  25 mg Oral BID WC  . cefTRIAXone (ROCEPHIN)  IV  1 g Intravenous Q24H  . clindamycin (CLEOCIN) IV  600 mg Intravenous Q8H  . clopidogrel  75 mg Oral Q  breakfast  . furosemide  40 mg Intravenous BID  . heparin  3,000 Units Intravenous Once  . irbesartan  75 mg Oral Daily  . levothyroxine  125 mcg Oral QAC breakfast  . potassium chloride  10 mEq Oral Daily  . potassium chloride  40 mEq Oral BID  . sodium chloride  3 mL Intravenous Q12H   Continuous Infusions: . heparin      Principal Problem:   NSTEMI (non-ST elevated myocardial infarction) Active Problems:   Hypertension   CAD (coronary artery disease)   Mild aortic stenosis   Hypothyroidism   Leukocytosis, unspecified    Time spent: 35 minutes.     Kellsie Grindle  [facility] Pager [phone]. If 7PM-7AM, please contact night-coverage at [email or web address], password [facility] 05/17/2013, 8:46 AM  LOS: 1 day

## 2013-05-17 NOTE — Progress Notes (Signed)
ANTICOAGULATION CONSULT NOTE - Follow Up Consult  Pharmacy Consult for Heparin Indication: chest pain/ACS  No Known Allergies  Patient Measurements: Height: 5\' 4"  (162.6 cm) Weight: 177 lb 14.6 oz (80.7 kg) IBW/kg (Calculated) : 54.7 Heparin Dosing Weight: 72kg  Vital Signs: Temp: 97.5 F (36.4 C) (12/13 1455) Temp src: Oral (12/13 1455) BP: 107/43 mmHg (12/13 1455) Pulse Rate: 66 (12/13 1455)  Labs:  Recent Labs  05/16/13 0730 05/16/13 1413 05/16/13 1515 05/16/13 1930 05/17/13 0215 05/17/13 0955 05/17/13 1635  HGB 14.6 14.3  --   --   --  13.6  --   HCT 43.6 42.1  --   --   --  40.6  --   PLT 171 149*  --   --   --  144*  --   HEPARINUNFRC  --   --   --   --   --   --  0.11*  CREATININE 0.66 0.63  --   --  0.85  --   --   TROPONINI <0.30  --  0.65* 2.35* 4.40*  --   --     Estimated Creatinine Clearance: 41.6 ml/min (by C-G formula based on Cr of 0.85).   Medications:  Heparin @ 850 units/hr  Assessment: Jacqueline [name] started on IV heparin early this morning for chest pain with positive troponins. Initial heparin level is subtherapeutic. No issues with infusion. No bleeding reported. Noted plan for medical management and to d/c heparin tomorrow.  Goal of Therapy:  Heparin level 0.3-0.7 units/ml Monitor platelets by anticoagulation protocol: Yes   Plan:  1) Re-bolus heparin 2000 units x 1 2) Increase heparin to 1100 units/hr 3) Check heparin level in 8 hours  Fredrik Rigger 05/17/2013,5:40 PM

## 2013-05-17 NOTE — Progress Notes (Deleted)
CSW provided bed offers list to pt.  CSW continues to follow for discharge planning to SNF.    [phone] (weekend CSW)

## 2013-05-17 NOTE — Progress Notes (Signed)
  Echocardiogram 2D Echocardiogram has been performed.  Nestor Ramp M 05/17/2013, 1:52 PM

## 2013-05-17 NOTE — Progress Notes (Signed)
8 bts V tach noted, K level at 3.7. Pt was asleep at the time. K. Lyman Bishop , NP updated

## 2013-05-17 NOTE — Progress Notes (Signed)
ANTICOAGULATION CONSULT NOTE - Initial Consult  Pharmacy Consult for Heparin Indication: chest pain/ACS  No Known Allergies  Patient Measurements: Height: 5\' 4"  (162.6 cm) Weight: 181 lb 7 oz (82.3 kg) IBW/kg (Calculated) : 54.7 Heparin Dosing Weight: 72 kg   Vital Signs: Temp: 100.2 F (37.9 C) (12/13 0513) Temp src: Oral (12/13 0513) BP: 131/51 mmHg (12/13 0513) Pulse Rate: 69 (12/13 0513)  Labs:  Recent Labs  05/16/13 0730 05/16/13 1413 05/16/13 1515 05/16/13 1930 05/17/13 0215  HGB 14.6 14.3  --   --   --   HCT 43.6 42.1  --   --   --   PLT 171 149*  --   --   --   CREATININE 0.66 0.63  --   --  0.85  TROPONINI <0.30  --  0.65* 2.35* 4.40*    Estimated Creatinine Clearance: 42 ml/min (by C-G formula based on Cr of 0.85).   Medical History: Past Medical History  Diagnosis Date  . Hypertension   . Mild aortic stenosis     a. 01/2010 Echo: Nl EF, mild AS/AI, mild LAE, mild PAH.  . Diabetes mellitus   . Gout   . Hypothyroidism   . CAD (coronary artery disease)     a. 2-08/2012: Cath/PCI: LM 10-20d, LAD 60-70p, 64m (Rotablator->2.5x32 Promus Premier DES), D1 70-80ost, D2- small-90 ost/95p, LCX large, OM1 40p, OM2 small 80-90, RCA dominant 8m, Ef 65-70%.  . Bilateral leg weakness     chronic  . Hyperlipidemia     Medications:  Prescriptions prior to admission  Medication Sig Dispense Refill  . acetaminophen (TYLENOL) 325 MG tablet Take 650 mg by mouth every 6 (six) hours as needed for pain.      . Ascorbic Acid (VITAMIN C PO) Take 1 tablet by mouth daily as needed (cold).      Marland Kitchen aspirin EC 81 MG tablet Take 81 mg by mouth daily.      Marland Kitchen atorvastatin (LIPITOR) 20 MG tablet Take 20 mg by mouth daily at 6 PM.      . carvedilol (COREG) 25 MG tablet Take 25 mg by mouth 2 (two) times daily with a meal.      . clopidogrel (PLAVIX) 75 MG tablet Take 75 mg by mouth daily with breakfast.      . furosemide (LASIX) 40 MG tablet Take 1 tablet (40 mg total) by mouth  daily as needed (for swelling).  90 tablet  3  . levothyroxine (SYNTHROID, LEVOTHROID) 125 MCG tablet Take 125 mcg by mouth daily.      . nitroGLYCERIN (NITROSTAT) 0.4 MG SL tablet Place 0.4 mg under the tongue every 5 (five) minutes as needed for chest pain.      Marland Kitchen olmesartan (BENICAR) 40 MG tablet Take 40 mg by mouth daily.      . potassium chloride (K-DUR) 10 MEQ tablet Take 10 mEq by mouth daily.        Assessment: 45 YOF presents with chest discomfort and SOB with exertion. She was treated for an episode of pneumonia one week PTA but cough never resolved. On admission, she had elevated proBNP and CBC with left shift. CXR showed mild pulmonary congestion. Troponins have trended up to 4.4 this AM. H/H wnl.  Goal of Therapy:  Heparin level 0.3-0.7 units/ml Monitor platelets by anticoagulation protocol: Yes   Plan:  Give 3000 units bolus x 1 Start heparin infusion at 850 units/hr Check anti-Xa level in 8 hours and daily while on heparin Continue  to monitor H&H and platelets  Vinnie Level, PharmD.  Clinical Pharmacist Pager [phone]

## 2013-05-18 LAB — CBC
Hemoglobin: 13.3 g/dL (ref 12.0–15.0)
MCH: 30.1 pg (ref 26.0–34.0)
MCHC: 33.3 g/dL (ref 30.0–36.0)
MCV: 90.3 fL (ref 78.0–100.0)
RBC: 4.42 MIL/uL (ref 3.87–5.11)

## 2013-05-18 LAB — BASIC METABOLIC PANEL
BUN: 23 mg/dL (ref 6–23)
CO2: 26 mEq/L (ref 19–32)
Chloride: 100 mEq/L (ref 96–112)
Glucose, Bld: 88 mg/dL (ref 70–99)
Potassium: 3.7 mEq/L (ref 3.5–5.1)
Sodium: 136 mEq/L (ref 135–145)

## 2013-05-18 MED ORDER — DOXYCYCLINE HYCLATE 100 MG PO TABS
100.0000 mg | ORAL_TABLET | Freq: Two times a day (BID) | ORAL | Status: DC
  Administered 2013-05-18 – 2013-05-19 (×3): 100 mg via ORAL
  Filled 2013-05-18 (×4): qty 1

## 2013-05-18 MED ORDER — FUROSEMIDE 40 MG PO TABS
40.0000 mg | ORAL_TABLET | Freq: Every day | ORAL | Status: DC
  Administered 2013-05-19: 40 mg via ORAL
  Filled 2013-05-18 (×2): qty 1

## 2013-05-18 MED ORDER — CLINDAMYCIN HCL 300 MG PO CAPS: 600.0000 mg | ORAL_CAPSULE | Freq: Three times a day (TID) | ORAL | Status: DC

## 2013-05-18 NOTE — Evaluation (Signed)
Physical Therapy Evaluation Patient Details Name: Jacqueline [name] MRN: [id number] DOB: Feb 23, 1917 Today's Date: 05/18/2013 Time: 4098-1191 PT Time Calculation (min): 50 min  PT Assessment / Plan / Recommendation History of Present Illness    With past medical history of coronary artery disease status post cath that revealed severe LAD disease which was treated with a DES stent on 07/30/2012, hypertension mild aortic stenosis that comes in for chest discomfort and shortness of breath with exertion. Found to have NSTEMI   Clinical Impression  Pt presents with new dependencies in functional mobility along with multiple fall risk factors, recommend SNF at [facility] for continued rehab needs post discharge.  Plan to see acutely to begin therapy for gait and balance retraining.      PT Assessment  Patient needs continued PT services    Follow Up Recommendations  SNF    Does the patient have the potential to tolerate intense rehabilitation      Barriers to Discharge        Equipment Recommendations  None recommended by PT    Recommendations for Other Services     Frequency Min 2X/week    Precautions / Restrictions Precautions Precautions: Fall Precaution Comments: history of falls mostly due to stumbles/trips, left RW in room for use with therapy/nursing mobility Restrictions Weight Bearing Restrictions: No Other Position/Activity Restrictions: elevate legs due to edema/cellulitis   Pertinent Vitals/Pain BP 110/40 upon sitting, pt asymtomatic      Mobility  Bed Mobility Bed Mobility: Supine to Sit;Sitting - Scoot to Edge of Bed Supine to Sit: 6: Modified independent (Device/Increase time);HOB elevated Sitting - Scoot to Edge of Bed: 5: Supervision Details for Bed Mobility Assistance: pt requires only minimal cues to scoot out for optimal leverage for sit to stand, and performs with HOB up to sit up but physically able with increased time Transfers Transfers: Sit to  Stand;Stand to Sit Sit to Stand: 4: Min assist;With upper extremity assist;With armrests;From bed;From toilet Stand to Sit: 3: Mod assist;With upper extremity assist;With armrests;To chair/3-in-1;To toilet Details for Transfer Assistance: under arm assist at left, cues to scoot out and push up/pull on grab bar, with assist mostly for initial lift off especially from low toilet and noting fair to good stability upon standing with min assist and standby guarding to pull up underpants; unable to assume fully erect posture Ambulation/Gait Ambulation/Gait Assistance: 5: Supervision Ambulation Distance (Feet): 15 Feet Assistive device: Rolling walker Ambulation/Gait Assistance Details: close guarding with cues for step length and foot clearance and to manage IV pole in tight space making 3 turns within space as pt walked to door, to bed, to bathroom, to chair.  no notable change in breathing and no c/o chest pain.  head 'fuzzy' but denies presyncope.  declines walking out of room, recommend performing 2/3/6 minute walk test to capture baseline endurance.   Gait Pattern: Step-through pattern;Decreased stride length;Decreased dorsiflexion - right;Decreased dorsiflexion - left;Right foot flat;Left foot flat;Shuffle;Antalgic;Trunk flexed;Narrow base of support Gait velocity: unmeasured but by observation decreased General Gait Details: see above Stairs: No Wheelchair Mobility Wheelchair Mobility: No    Exercises     PT Diagnosis: Difficulty walking  PT Problem List: Decreased strength;Decreased range of motion;Decreased activity tolerance;Decreased balance;Decreased mobility;Decreased knowledge of use of DME;Cardiopulmonary status limiting activity;Obesity;Impaired sensation PT Treatment Interventions: Gait training;DME instruction;Functional mobility training;Therapeutic activities;Balance training;Therapeutic exercise;Patient/family education     PT Goals(Current goals can be found in the care plan  section) Acute Rehab PT Goals Patient Stated Goal: return to  independent living "I want to do it on my own' PT Goal Formulation: With patient Time For Goal Achievement: 06/01/13 Potential to Achieve Goals: Good  Visit Information  Last PT Received On: 05/18/13 Assistance Needed: +1       Prior Functioning  Home Living Family/patient expects to be discharged to:: Skilled nursing facility Additional Comments: lives at [facility] side manor in independent living, they have continuum of care and pt can got to rehab/snf side as indidcated Prior Function Level of Independence: Independent with assistive device(s) Comments: Pt. uses scooter for going to meals. Communication Communication: No difficulties    Cognition  Cognition Arousal/Alertness: Awake/alert Behavior During Therapy: WFL for tasks assessed/performed Overall Cognitive Status: Within Functional Limits for tasks assessed    Extremity/Trunk Assessment Upper Extremity Assessment Upper Extremity Assessment: Generalized weakness;Overall [facility] for tasks assessed Lower Extremity Assessment Lower Extremity Assessment: Generalized weakness;Overall [facility] for tasks assessed Cervical / Trunk Assessment Cervical / Trunk Assessment: Kyphotic   Balance Balance Balance Assessed: Yes High Level Balance High Level Balance Comments: screened briefly while standing in bathroom pulling up underpants noting good steady balance, but pt is reliant on RW for stability and reports furniture walking and history of at least 2 falls in past year mostly due to tripping over threshold and also 'my eyes play tricks on me' perhaps due to bifocals (most recent fall pt carrying food into living room and tripped going onto carpeted floor  End of Session PT - End of Session Activity Tolerance: Patient tolerated treatment well Patient left: in chair;with call bell/phone within reach;with chair alarm set Nurse Communication: Mobility status  GP     Dennis Bast 05/18/2013, 9:57 AM

## 2013-05-18 NOTE — Progress Notes (Signed)
Pt c/o shortness of breath, O2 sats 95% on room air, lungs diminished bilateral. O2 applied at 2L per [place] with saturation 100%.  Will continue to monitor closely.

## 2013-05-18 NOTE — Progress Notes (Signed)
Consulting cardiologist: Jens Som  Subjective:   Feels well. Has been up walking. No complaints of chest pain or SOB   Objective:   Temp:  [97.5 F (36.4 C)-98.5 F (36.9 C)] 98 F (36.7 C) (12/14 0500) Pulse Rate:  [66-67] 67 (12/14 0500) Resp:  [18] 18 (12/14 0500) BP: (107-122)/(43-47) 122/43 mmHg (12/14 0500) SpO2:  [97 %-98 %] 98 % (12/14 0500) Weight:  [177 lb 14.6 oz (80.7 kg)-179 lb (81.194 kg)] 179 lb (81.194 kg) (12/14 0500) Last BM Date: 05/14/13  Filed Weights   05/17/13 0513 05/17/13 1700 05/18/13 0500  Weight: 181 lb 7 oz (82.3 kg) 177 lb 14.6 oz (80.7 kg) 179 lb (81.194 kg)    Intake/Output Summary (Last 24 hours) at 05/18/13 0905 Last data filed at 05/18/13 0300  Gross per 24 hour  Intake    668 ml  Output   1300 ml  Net   -632 ml    Telemetry: NSR rate of 73 bpm  Exam:  General: No acute distress.  HEENT: Conjunctiva and lids normal, oropharynx clear.  Lungs: Clear to auscultation, nonlabored.  Cardiac: No elevated JVP or bruits. RRR, 1/6 systolic murmur.  Abdomen: Normoactive bowel sounds, nontender, nondistended.  Extremities: No pitting edema, distal pulses full. Thickened skin without edema noted.   Neuropsychiatric: Alert and oriented x3, affect appropriate.   Lab Results:  Basic Metabolic Panel:  Recent Labs Lab 05/16/13 0730 05/16/13 1413 05/17/13 0215 05/18/13 0305  NA 134*  --  134* 136  K 3.6  --  3.7 3.7  CL 100  --  97 100  CO2 25  --  29 26  GLUCOSE 134*  --  111* 88  BUN 17  --  14 23  CREATININE 0.66 0.63 0.85 0.90  CALCIUM 9.1  --  8.4 8.3*    CBC:  Recent Labs Lab 05/16/13 1413 05/17/13 0955 05/18/13 0305  WBC 20.1* 9.8 7.3  HGB 14.3 13.6 13.3  HCT 42.1 40.6 39.9  MCV 89.8 89.4 90.3  PLT 149* 144* 132*    Cardiac Enzymes:  Recent Labs Lab 05/16/13 1515 05/16/13 1930 05/17/13 0215  TROPONINI 0.65* 2.35* 4.40*    BNP:  Recent Labs  07/31/12 0535 05/16/13 0730  PROBNP 1371.0*  1997.0*   Echocardiogram: Left ventricle: The cavity size was normal. Wall thickness was increased in a pattern of mild LVH. Systolic function was normal. The estimated ejection fraction was in the range of 60% to 65%. - Aortic valve: Poorly visualized Calcified There was mild stenosis. Mild regurgitation. - Mitral valve: Calcified annulus. Mildly thickened leaflets . Mild regurgitation. - Left atrium: The atrium was moderately dilated. - Right atrium: The atrium was mildly dilated. - Pulmonary arteries: PA peak pressure: 51mm Hg (S).   Radiology: Dg Foot Complete Right  05/17/2013   CLINICAL DATA:  Fourth toe swelling  EXAM: RIGHT FOOT COMPLETE - 3+ VIEW  COMPARISON:  None.  FINDINGS: No acute fracture dislocation is noted. Diffuse degenerative changes are noted in the interphalangeal joints. Mild soft tissue swelling is seen. No definitive erosive changes to suggest osteomyelitis are noted.  IMPRESSION: Degenerative change without acute abnormality.   Electronically Signed   By: Alcide Clever M.D.   On: 05/17/2013 09:47     Medications:   Scheduled Medications: . aspirin EC  81 mg Oral Daily  . atorvastatin  20 mg Oral q1800  . carvedilol  25 mg Oral BID WC  . clindamycin (CLEOCIN) IV  600 mg Intravenous  Q8H  . clopidogrel  75 mg Oral Q breakfast  . furosemide  40 mg Intravenous BID  . irbesartan  75 mg Oral Daily  . levothyroxine  125 mcg Oral QAC breakfast  . pneumococcal 23 valent vaccine  0.5 mL Intramuscular Tomorrow-1000  . potassium chloride  10 mEq Oral Daily  . sodium chloride  3 mL Intravenous Q12H          PRN Medications:  sodium chloride, acetaminophen, nitroGLYCERIN, ondansetron (ZOFRAN) IV, sodium chloride   Assessment and Plan:   1. NSTEMI: Medical management is planned.  Continue on DAPT with ASA and Plavix, statin. Heparin d/c'd. Get her up to walk in the hall today.  2. Acute on Chronic Diastolic CHF: Repeat echo demonstrated normal EF, mild AoV  stenosis, with AR, and MR. Will change IV lasix to PO. 40 mg daily. Creatine 0.90.  3. CAD: She had a cardiac catheterization in February of 2014 that showed a 10-20% left main, 60-70% proximal LAD followed by a 95% lesion. She had a 70-80% ostial first diagonal and a very small second diagonal with a 95% stenosis. There was a small marginal branch with an 80-90% stenosis. Ejection fraction 65-70%. Patient had PCI of the LAD with rotational atherectomy followed by drug-eluting stent. Medical management only  Charlton Haws

## 2013-05-18 NOTE — Progress Notes (Signed)
[facility] PROGRESS NOTE  Jacqueline [name] WUJ:[id number] DOB: 1916/07/17 DOA: 05/16/2013 PCP: Warrick Parisian, MD  Assessment/Plan: 1-Acute systolic heart failure;  -Continue with lasix 40 mg IV BID.  -Weight 83 --- down to 81 -Renal function stable.  -Dyspnea improved.   2-NSTEMI:  -Continue with medical management.  -Will DC heparin Gtt.  -Continue with plavix, aspirin, Coreg. Avapro. -PRN nitroglycerin.  -Will follow cardio recommendation.  -Troponin peak to 4.4. -ECHO : normal Ef.   3-Leukocytosis: WBC decrease to 7.3 from 20. -Could be secondary to right lower extremity cellulitis, vs, NSTEMI. Although chest x ray only show pulmonary vascular prominence.  -Will discontinue ceftriaxone. Will change clindamycin to oral. Day 3 antibiotics.  -x ray right foot: Degenerative change without acute abnormality. -UA negative.  -Blood culture pending.    4-Hypertension:  -continue Lasix at low dose ACE, and continue beta blockers.  Mild aortic stenosis -ECHO; Mild aortic stenosis.    Code Status: Full Code.  Family Communication: Care discussed with patient.  Disposition Plan: Will consult PT.    Consultants:  Cardiology.   Procedures: ECHO; Left ventricle: The cavity size was normal. Wall thickness was increased in a pattern of mild LVH. Systolic function was normal. The estimated ejection fraction was in the range of 60% to 65%. - Aortic valve: Poorly visualized Calcified There was mild stenosis. Mild regurgitation. - Mitral valve: Calcified annulus. Mildly thickened leaflets . Mild regurgitation. - Left atrium: The atrium was moderately dilated. - Right atrium: The atrium was mildly dilated.    Antibiotics:  Ceftriaxone 12-12----12-14  Clindamycin 12-13  HPI/Subjective: Feeling better. Asking when she can go home. No chest pain. Right foot pain, toe has resolved.   Objective: Filed Vitals:   05/18/13 0500  BP: 122/43  Pulse: 67  Temp: 98 F  (36.7 C)  Resp: 18    Intake/Output Summary (Last 24 hours) at 05/18/13 0901 Last data filed at 05/18/13 0300  Gross per 24 hour  Intake    668 ml  Output   1300 ml  Net   -632 ml   Filed Weights   05/17/13 0513 05/17/13 1700 05/18/13 0500  Weight: 82.3 kg (181 lb 7 oz) 80.7 kg (177 lb 14.6 oz) 81.194 kg (179 lb)    Exam:   General:  No distress.   Cardiovascular: S 1, S 2 RRR, systolic murmur.   Respiratory: no wheezes, few crackles bases.   Abdomen: Bs present, soft, NT  Musculoskeletal: bilateral lower extremity edema, Right lower extremity with less redness.  Data Reviewed: Basic Metabolic Panel:  Recent Labs Lab 05/16/13 0730 05/16/13 1413 05/17/13 0215 05/18/13 0305  NA 134*  --  134* 136  K 3.6  --  3.7 3.7  CL 100  --  97 100  CO2 25  --  29 26  GLUCOSE 134*  --  111* 88  BUN 17  --  14 23  CREATININE 0.66 0.63 0.85 0.90  CALCIUM 9.1  --  8.4 8.3*   Liver Function Tests: No results found for this basename: AST, ALT, ALKPHOS, BILITOT, PROT, ALBUMIN,  in the last 168 hours No results found for this basename: LIPASE, AMYLASE,  in the last 168 hours No results found for this basename: AMMONIA,  in the last 168 hours CBC:  Recent Labs Lab 05/16/13 0730 05/16/13 1413 05/17/13 0955 05/18/13 0305  WBC 19.8* 20.1* 9.8 7.3  NEUTROABS 18.5* 18.6*  --   --   HGB 14.6 14.3 13.6 13.3  HCT 43.6  42.1 40.6 39.9  MCV 88.1 89.8 89.4 90.3  PLT 171 149* 144* 132*   Cardiac Enzymes:  Recent Labs Lab 05/16/13 0730 05/16/13 1515 05/16/13 1930 05/26/2013 0215  TROPONINI <0.30 0.65* 2.35* 4.40*   BNP (last 3 results)  Recent Labs  07/31/12 0535 05/16/13 0730  PROBNP 1371.0* 1997.0*   CBG: No results found for this basename: GLUCAP,  in the last 168 hours  No results found for this or any previous visit (from the past 240 hour(s)).   Studies: Dg Foot Complete Right  05-26-13   CLINICAL DATA:  Fourth toe swelling  EXAM: RIGHT FOOT COMPLETE - 3+  VIEW  COMPARISON:  None.  FINDINGS: No acute fracture dislocation is noted. Diffuse degenerative changes are noted in the interphalangeal joints. Mild soft tissue swelling is seen. No definitive erosive changes to suggest osteomyelitis are noted.  IMPRESSION: Degenerative change without acute abnormality.   Electronically Signed   By: Alcide Clever M.D.   On: 05/26/2013 09:47    Scheduled Meds: . aspirin EC  81 mg Oral Daily  . atorvastatin  20 mg Oral q1800  . carvedilol  25 mg Oral BID WC  . clindamycin (CLEOCIN) IV  600 mg Intravenous Q8H  . clopidogrel  75 mg Oral Q breakfast  . furosemide  40 mg Intravenous BID  . irbesartan  75 mg Oral Daily  . levothyroxine  125 mcg Oral QAC breakfast  . pneumococcal 23 valent vaccine  0.5 mL Intramuscular Tomorrow-1000  . potassium chloride  10 mEq Oral Daily  . sodium chloride  3 mL Intravenous Q12H   Continuous Infusions:    Principal Problem:   NSTEMI (non-ST elevated myocardial infarction) Active Problems:   Hypertension   CAD (coronary artery disease)   Mild aortic stenosis   Hypothyroidism   Leukocytosis, unspecified    Time spent: 30 minutes.     Leyani Gargus  [facility] Pager [phone]. If 7PM-7AM, please contact night-coverage at [email or web address], password [email or web address] [email or web address] 05/18/2013, 9:01 AM  LOS: 2 days

## 2013-05-18 NOTE — Progress Notes (Signed)
ANTICOAGULATION CONSULT NOTE - Follow Up Consult  Pharmacy Consult for Heparin Indication: chest pain/ACS  No Known Allergies  Patient Measurements: Height: 5\' 4"  (162.6 cm) Weight: 179 lb (81.194 kg) IBW/kg (Calculated) : 54.7 Heparin Dosing Weight: 72kg  Vital Signs: Temp: 98 F (36.7 C) (12/14 0500) BP: 122/43 mmHg (12/14 0500) Pulse Rate: 67 (12/14 0500)  Labs:  Recent Labs  05/16/13 1413 05/16/13 1515 05/16/13 1930 05/17/13 0215 05/17/13 0955 05/17/13 1635 05/18/13 0305  HGB 14.3  --   --   --  13.6  --  13.3  HCT 42.1  --   --   --  40.6  --  39.9  PLT 149*  --   --   --  144*  --  132*  HEPARINUNFRC  --   --   --   --   --  0.11* 0.52  CREATININE 0.63  --   --  0.85  --   --  0.90  TROPONINI  --  0.65* 2.35* 4.40*  --   --   --     Estimated Creatinine Clearance: 39.4 ml/min (by C-G formula based on Cr of 0.9).   Medications:  Heparin @ 1100 units/hr  Assessment: 89yof started on IV heparin yesterday morning for chest pain with positive troponins. Heparin level is now therapeutic but has trended up significantly from 0.11>>0.52. No issues with infusion. No bleeding reported. CBC stable. Noted plan for medical management and plan to d/c heparin today.  Goal of Therapy:  Heparin level 0.3-0.7 units/ml Monitor platelets by anticoagulation protocol: Yes   Plan:  1) Continue heparin 1100 units/hr for now 2) Cardiology planning to d/c IV heparin today 3) Continue to monitor for s/s of bleeding   Vinnie Level, PharmD.  Clinical Pharmacist Pager [phone]

## 2013-05-18 NOTE — Progress Notes (Signed)
[place] is from [place] [facility].  Awaiting PT/OT consult to determine if pt needs to return at SNF level of care.  If pt needs SNF will complete FL2 and [facility] state pt can return at SNF level of care.  If pt can return to [place], can be discharged like a regular pt returning home.  CSW will continue to follow to assist with discharge planning as needed.   [phone] (weekend CSW)

## 2013-05-19 ENCOUNTER — Encounter (HOSPITAL_COMMUNITY): Payer: Self-pay | Admitting: General Practice

## 2013-05-19 DIAGNOSIS — I509 Heart failure, unspecified: Secondary | ICD-10-CM

## 2013-05-19 DIAGNOSIS — I5031 Acute diastolic (congestive) heart failure: Secondary | ICD-10-CM | POA: Diagnosis present

## 2013-05-19 LAB — BASIC METABOLIC PANEL
BUN: 20 mg/dL (ref 6–23)
CO2: 26 mEq/L (ref 19–32)
Calcium: 8.8 mg/dL (ref 8.4–10.5)
Chloride: 103 mEq/L (ref 96–112)
Creatinine, Ser: 0.67 mg/dL (ref 0.50–1.10)
GFR calc non Af Amer: 73 mL/min — ABNORMAL LOW (ref >90)
Glucose, Bld: 92 mg/dL (ref 70–99)

## 2013-05-19 MED ORDER — FUROSEMIDE 40 MG PO TABS: 40.0000 mg | ORAL_TABLET | Freq: Every day | ORAL | Status: AC

## 2013-05-19 MED ORDER — DOXYCYCLINE HYCLATE 100 MG PO TABS: 100.0000 mg | ORAL_TABLET | Freq: Two times a day (BID) | ORAL | Status: DC

## 2013-05-19 NOTE — Progress Notes (Signed)
TELEMETRY: Reviewed telemetry pt in NSR with frequent PACs and occ PVCs: Filed Vitals:   05/18/13 1345 05/18/13 2100 05/18/13 2300 05/19/13 0500  BP: 99/46 128/39  148/60  Pulse: 65 70  69  Temp: 98.2 F (36.8 C) 98.5 F (36.9 C)  98.1 F (36.7 C)  TempSrc: Oral     Resp: 17 20  20   Height:      Weight:    179 lb 14.4 oz (81.602 kg)  SpO2: 97% 94% 95% 95%    Intake/Output Summary (Last 24 hours) at 05/19/13 0740 Last data filed at 05/19/13 0200  Gross per 24 hour  Intake    780 ml  Output    753 ml  Net     27 ml    SUBJECTIVE Put oxygen back on last night. States she was SOB when lying down. SOB better. No chest pain. Still has some cough productive of clear phlegm.  LABS: Basic Metabolic Panel:  Recent Labs  16/10/96 0305 05/19/13 0545  NA 136 140  K 3.7 3.7  CL 100 103  CO2 26 26  GLUCOSE 88 92  BUN 23 20  CREATININE 0.90 0.67  CALCIUM 8.3* 8.8   CBC:  Recent Labs  05/16/13 1413 05/17/13 0955 05/18/13 0305  WBC 20.1* 9.8 7.3  NEUTROABS 18.6*  --   --   HGB 14.3 13.6 13.3  HCT 42.1 40.6 39.9  MCV 89.8 89.4 90.3  PLT 149* 144* 132*   Cardiac Enzymes:  Recent Labs  05/16/13 1515 05/16/13 1930 05/17/13 0215  TROPONINI 0.65* 2.35* 4.40*   BNP: 1997   Radiology/Studies:  Dg Chest Port 1 View  05/16/2013   CLINICAL DATA:  Chest pain and cough.  EXAM: PORTABLE CHEST - 1 VIEW  COMPARISON:  07/31/2012 and 12/29/2011  FINDINGS: Heart size is normal. Slight prominence of the pulmonary vascularity. No infiltrates or effusions. Chronic elevation of the right hemidiaphragm, unchanged since 12/29/2011. No acute osseous abnormality.  IMPRESSION: Slight pulmonary vascular prominence.   Electronically Signed   By: Geanie Cooley M.D.   On: 05/16/2013 07:43   Dg Foot Complete Right  05/17/2013   CLINICAL DATA:  Fourth toe swelling  EXAM: RIGHT FOOT COMPLETE - 3+ VIEW  COMPARISON:  None.  FINDINGS: No acute fracture dislocation is noted. Diffuse  degenerative changes are noted in the interphalangeal joints. Mild soft tissue swelling is seen. No definitive erosive changes to suggest osteomyelitis are noted.  IMPRESSION: Degenerative change without acute abnormality.   Electronically Signed   By: Alcide Clever M.D.   On: 05/17/2013 09:47   Ecg: NSR. No acute ST-T changes.  Echo:Study Conclusions  - Left ventricle: The cavity size was normal. Wall thickness was increased in a pattern of mild LVH. Systolic function was normal. The estimated ejection fraction was in the range of 60% to 65%. - Aortic valve: Poorly visualized Calcified There was mild stenosis. Mild regurgitation. - Mitral valve: Calcified annulus. Mildly thickened leaflets . Mild regurgitation. - Left atrium: The atrium was moderately dilated. - Right atrium: The atrium was mildly dilated. - Pulmonary arteries: PA peak pressure: 51mm Hg (S).   PHYSICAL EXAM General: Elderly, well nourished, in no acute distress. Head: Normocephalic, atraumatic, sclera non-icteric, no xanthomas, nares are without discharge. Neck: Negative for carotid bruits. JVD not elevated. Lungs: Clear bilaterally to auscultation without wheezes, rales, or rhonchi. Breathing is unlabored. Heart: RRR S1 S2 with 2/6 systolic murmur. Abdomen: Soft, non-tender, non-distended with normoactive bowel sounds. No hepatomegaly.  No rebound/guarding. No obvious abdominal masses. Msk:  Strength and tone appears normal for age. Extremities: 1+edema right leg. None on the left.  Distal pedal pulses are 2+ and equal bilaterally. Neuro: Alert and oriented X 3. Moves all extremities spontaneously. Psych:  Responds to questions appropriately with a normal affect.  ASSESSMENT AND PLAN: 1. NSTEMI. No significant anginal symptoms and Ecg without acute change. Normal LV function by Echo. I suspect this is more of demand ischemia due to CHF/bronchitis. 2. Acute diastolic CHF. Clinically improved with diuresis. Now on  lasix 40 mg po daily. Weight down 4 lbs from admit. Will DC oxygen and ambulate in halls. If stable could be DC later today. Continue Coreg and ARB. 3. Recent RLE cellulitis. 4. CAD s/p complex stenting of the LAD with DES in 2/14. Residual disease in small diagonals and OM. Will treat medically. Continue DAPT with ASA and Plavix.  5. HTN. 6. Hyperlipidemia. On lipitor   Principal Problem:   NSTEMI (non-ST elevated myocardial infarction) Active Problems:   Hypertension   CAD (coronary artery disease)   Mild aortic stenosis   Hypothyroidism   Leukocytosis, unspecified    Signed, Peter [place] MD,FACC 05/19/2013 7:49 AM

## 2013-05-19 NOTE — Discharge Summary (Signed)
Physician Discharge Summary  Jacqueline [name] FAO:[id number] DOB: 1916-12-07 DOA: 05/16/2013  PCP: Warrick Parisian, MD  Admit date: 05/16/2013 Discharge date: 05/19/2013  Time spent: 35 minutes  Recommendations for Outpatient Follow-up:  1. Needs to follow up with cardiology in 1 week, further adjustment diuretic.   Discharge Diagnoses:    NSTEMI (non-ST elevated myocardial infarction)   Right lower extremity cellulitis   Hypertension   CAD (coronary artery disease)   Mild aortic stenosis   Hypothyroidism   Leukocytosis, unspecified   Acute diastolic CHF (congestive heart failure)   Discharge Condition: Stable  Diet recommendation: Heart Healthy  Filed Weights   05/17/13 1700 05/18/13 0500 05/19/13 0500  Weight: 80.7 kg (177 lb 14.6 oz) 81.194 kg (179 lb) 81.602 kg (179 lb 14.4 oz)    History of present illness:  Jacqueline [name] is a 79 y.o. female  With past medical history of coronary artery disease status post cath that revealed severe LAD disease which was treated with a DES stent on 07/30/2012, hypertension mild aortic stenosis that comes in for chest discomfort and shortness of breath with exertion. She relates she was treated for an episode of pneumonia one week prior to admission and she was having cough. She relates her cough is persistent. She relates no fevers. She relates she wakes up in the middle of the night short of breath. No orthopnea, or lower extremities swelling which is progressively getting worse. Her primary care doctor started on Lasix which she has decreased the dose recently.   Hospital Course:  Patient presents with dyspnea, chest discomfort, leukocytosis. Troponin peak to 4. She was diagnosed and treated for NSTEMI and right lower extremity cellulitis. She was initially on heparin Gtt. She was treated medically for NSTEMI. She was started on ceftriaxone and clindamycin to cover for bronchitis, right lower extremity cellulitis. She will be discharge on  doxycycline for 4 more days. If patient ambulates well and no SOB she will be transfer to SNF.   1-Acute systolic heart failure;  -She was treated with  lasix 40 mg IV BID. Subsequently transition to lasix 40 mg daily.  -Weight 83 --- down to 81  -Renal function stable.  -Dyspnea improved.  -was complaining of dyspnea last night on supine position. Advised patient to sleep at 60 degree.   2-NSTEMI:  -Continue with medical management. No CATH planned during this admission.  -Patient received heparin Gtt.  -Continue with plavix, aspirin, Coreg. Avapro.  -PRN nitroglycerin.  -Troponin peak to 4.4.  -ECHO : normal Ef.   3-Leukocytosis: WBC decrease to 7.3 from 20.  -Could be secondary to right lower extremity cellulitis, vs, NSTEMI.  -Received ceftriaxone for 3 days.  Transition to doxycycline. Day 4 antibiotics.  -x ray right foot: Degenerative change without acute abnormality.  -UA negative.  -Blood culture no growth to date.   4-Hypertension:  -continue Lasix at low dose ACE, and continue beta blockers.  Mild aortic stenosis  -ECHO; Mild aortic stenosis.    Procedures: ECHO;- Left ventricle: The cavity size was normal. Wall thickness was increased in a pattern of mild LVH. Systolic function was normal. The estimated ejection fraction was in the range of 60% to 65%. - Aortic valve: Poorly visualized Calcified There was mild stenosis. Mild regurgitation. - Mitral valve: Calcified annulus. Mildly thickened leaflets . Mild regurgitation. - Left atrium: The atrium was moderately dilated. - Right atrium: The atrium was mildly dilated.    Consultations:  Dr Eden Emms  Discharge Exam: Ceasar Mons Vitals:  05/19/13 0500  BP: 148/60  Pulse: 69  Temp: 98.1 F (36.7 C)  Resp: 20    General: No distress.  Cardiovascular: S 1, S 2 RRR Respiratory: CTA  Discharge Instructions  Discharge Orders   Future Appointments Provider Department Dept Phone   08/28/2013 3:00 PM Peter M  [place], MD [facility] [place] Office [phone]   Future Orders Complete By Expires   Diet - low sodium heart healthy  As directed    Increase activity slowly  As directed        Medication List         acetaminophen 325 MG tablet  Commonly known as:  TYLENOL  Take 650 mg by mouth every 6 (six) hours as needed for pain.     aspirin EC 81 MG tablet  Take 81 mg by mouth daily.     atorvastatin 20 MG tablet  Commonly known as:  LIPITOR  Take 20 mg by mouth daily at 6 PM.     carvedilol 25 MG tablet  Commonly known as:  COREG  Take 25 mg by mouth 2 (two) times daily with a meal.     clopidogrel 75 MG tablet  Commonly known as:  PLAVIX  Take 75 mg by mouth daily with breakfast.     doxycycline 100 MG tablet  Commonly known as:  VIBRA-TABS  Take 1 tablet (100 mg total) by mouth every 12 (twelve) hours.     furosemide 40 MG tablet  Commonly known as:  LASIX  Take 1 tablet (40 mg total) by mouth daily.     levothyroxine 125 MCG tablet  Commonly known as:  SYNTHROID, LEVOTHROID  Take 125 mcg by mouth daily.     nitroGLYCERIN 0.4 MG SL tablet  Commonly known as:  NITROSTAT  Place 0.4 mg under the tongue every 5 (five) minutes as needed for chest pain.     olmesartan 40 MG tablet  Commonly known as:  BENICAR  Take 40 mg by mouth daily.     potassium chloride 10 MEQ tablet  Commonly known as:  K-DUR  Take 10 mEq by mouth daily.     VITAMIN C PO  Take 1 tablet by mouth daily as needed (cold).       No Known Allergies    The results of significant diagnostics from this hospitalization (including imaging, microbiology, ancillary and laboratory) are listed below for reference.    Significant Diagnostic Studies: Dg Chest Port 1 View  05/16/2013   CLINICAL DATA:  Chest pain and cough.  EXAM: PORTABLE CHEST - 1 VIEW  COMPARISON:  07/31/2012 and 12/29/2011  FINDINGS: Heart size is normal. Slight prominence of the pulmonary vascularity. No infiltrates or  effusions. Chronic elevation of the right hemidiaphragm, unchanged since 12/29/2011. No acute osseous abnormality.  IMPRESSION: Slight pulmonary vascular prominence.   Electronically Signed   By: Geanie Cooley M.D.   On: 05/16/2013 07:43   Dg Foot Complete Right  05/17/2013   CLINICAL DATA:  Fourth toe swelling  EXAM: RIGHT FOOT COMPLETE - 3+ VIEW  COMPARISON:  None.  FINDINGS: No acute fracture dislocation is noted. Diffuse degenerative changes are noted in the interphalangeal joints. Mild soft tissue swelling is seen. No definitive erosive changes to suggest osteomyelitis are noted.  IMPRESSION: Degenerative change without acute abnormality.   Electronically Signed   By: Alcide Clever M.D.   On: 05/17/2013 09:47    Microbiology: Recent Results (from the past 240 hour(s))  CULTURE, BLOOD (ROUTINE  X 2)     Status: None   Collection Time    05/17/13  9:55 AM      Result Value Range Status   Specimen Description BLOOD LEFT ARM   Final   Special Requests BOTTLES DRAWN AEROBIC ONLY 10CC   Final   Culture  Setup Time     Final   Value: 05/17/2013 16:50     Performed at [facility]   Culture     Final   Value:        BLOOD CULTURE RECEIVED NO GROWTH TO DATE CULTURE WILL BE HELD FOR 5 DAYS BEFORE ISSUING A FINAL NEGATIVE REPORT     Performed at [facility]   Report Status PENDING   Incomplete  CULTURE, BLOOD (ROUTINE X 2)     Status: None   Collection Time    05/17/13 10:00 AM      Result Value Range Status   Specimen Description BLOOD LEFT ARM   Final   Special Requests BOTTLES DRAWN AEROBIC ONLY 10CC   Final   Culture  Setup Time     Final   Value: 05/17/2013 16:49     Performed at [facility]   Culture     Final   Value:        BLOOD CULTURE RECEIVED NO GROWTH TO DATE CULTURE WILL BE HELD FOR 5 DAYS BEFORE ISSUING A FINAL NEGATIVE REPORT     Performed at [facility]   Report Status PENDING   Incomplete     Labs: Basic Metabolic Panel:  Recent  Labs Lab 05/16/13 0730 05/16/13 1413 05/17/13 0215 05/18/13 0305 05/19/13 0545  NA 134*  --  134* 136 140  K 3.6  --  3.7 3.7 3.7  CL 100  --  97 100 103  CO2 25  --  29 26 26   GLUCOSE 134*  --  111* 88 92  BUN 17  --  14 23 20   CREATININE 0.66 0.63 0.85 0.90 0.67  CALCIUM 9.1  --  8.4 8.3* 8.8   Liver Function Tests: No results found for this basename: AST, ALT, ALKPHOS, BILITOT, PROT, ALBUMIN,  in the last 168 hours No results found for this basename: LIPASE, AMYLASE,  in the last 168 hours No results found for this basename: AMMONIA,  in the last 168 hours CBC:  Recent Labs Lab 05/16/13 0730 05/16/13 1413 05/17/13 0955 05/18/13 0305  WBC 19.8* 20.1* 9.8 7.3  NEUTROABS 18.5* 18.6*  --   --   HGB 14.6 14.3 13.6 13.3  HCT 43.6 42.1 40.6 39.9  MCV 88.1 89.8 89.4 90.3  PLT 171 149* 144* 132*   Cardiac Enzymes:  Recent Labs Lab 05/16/13 0730 05/16/13 1515 05/16/13 1930 05/17/13 0215  TROPONINI <0.30 0.65* 2.35* 4.40*   BNP: BNP (last 3 results)  Recent Labs  07/31/12 0535 05/16/13 0730  PROBNP 1371.0* 1997.0*   CBG: No results found for this basename: GLUCAP,  in the last 168 hours     Signed:  Madelon Welsch  [facility] 05/19/2013, 8:08 AM

## 2013-05-19 NOTE — Progress Notes (Signed)
Pt discharged by wheelchair in the care of her son Jonny Ruiz who will be transporting her to [facility] side rehab area. Called report to [facility]. Pt in good health v/s stable in good spirit.

## 2013-05-19 NOTE — Evaluation (Signed)
Occupational Therapy Evaluation Patient Details Name: Jacqueline [name] MRN: [id number] DOB: 11-28-1916 Today's Date: 05/19/2013 Time: 7829-5621 OT Time Calculation (min): 31 min  OT Assessment / Plan / Recommendation History of present illness With past medical history of coronary artery disease status post cath that revealed severe LAD disease which was treated with a DES stent on 07/30/2012, hypertension mild aortic stenosis that comes in for chest discomfort and shortness of breath with exertion. Found to have NSTEMI   Clinical Impression   Pt presents w/ dx as above now impacting her ability to independently perform ADL's and functional transfers. Pt has h/o falls and therefore currently recommend SNF Rehab & 24hr/supervision/assist to assist in maximizing independence w/ ADL's/transfers. Will follow acutely for OT.    OT Assessment  Patient needs continued OT Services    Follow Up Recommendations  SNF;Supervision/Assistance - 24 hour    Barriers to Discharge      Equipment Recommendations  None recommended by OT    Recommendations for Other Services    Frequency  Min 2X/week    Precautions / Restrictions Precautions Precautions: Fall Precaution Comments: history of falls mostly due to stumbles/trips, left RW in room for use with therapy/nursing mobility Restrictions Weight Bearing Restrictions: No Other Position/Activity Restrictions: elevate legs due to edema/cellulitis   Pertinent Vitals/Pain No c/o pain    ADL  Eating/Feeding: Performed;Independent;Set up Where Assessed - Eating/Feeding: Edge of bed Grooming: Performed;Wash/dry hands;Wash/dry face;Teeth care;Set up Where Assessed - Grooming: Supported sitting Upper Body Bathing: Performed;Chest;Right arm;Left arm;Abdomen;Set up Where Assessed - Upper Body Bathing: Supported sitting Lower Body Bathing: Performed;Minimal assistance Where Assessed - Lower Body Bathing: Supported sit to stand Upper Body Dressing:  Performed;Min guard;Set up Where Assessed - Upper Body Dressing: Supported sitting Lower Body Dressing: Performed;Moderate assistance;Maximal assistance Where Assessed - Lower Body Dressing: Supported sit to Pharmacist, hospital: Performed;Moderate assistance Toilet Transfer Method: Stand pivot;Sit to Barista: Bedside commode Toileting - Clothing Manipulation and Hygiene: Performed;Moderate assistance;Maximal assistance Where Assessed - Toileting Clothing Manipulation and Hygiene: Sit on 3-in-1 or toilet;Sit to stand from 3-in-1 or toilet Tub/Shower Transfer Method: Not assessed Equipment Used: Gait belt;Rolling walker;Other (comment) (3:1) Transfers/Ambulation Related to ADLs: Pt was Mod A sit to stand from EOB w/ UE assist, verbal and tactile cues w/ RW & rails. ADL Comments: Pt was seen this am for OT ADL retraining session. Pt currently requires increased assistance for functional transfers and bathing/dressing tasks. Pt was educated in role of OT and recommendation of SNF rehab prior to return to ILF, pt appeared to be confused at times. ?If she understood recommendations for SNF Rehab.    OT Diagnosis: Generalized weakness  OT Problem List: Decreased activity tolerance;Decreased knowledge of precautions;Decreased knowledge of use of DME or AE;Cardiopulmonary status limiting activity;Decreased strength OT Treatment Interventions: Self-care/ADL training;DME and/or AE instruction;Patient/family education;Therapeutic activities   OT Goals(Current goals can be found in the care plan section) Acute Rehab OT Goals Patient Stated Goal: return to independent living "I want to do it on my own' Time For Goal Achievement: 06/02/13 Potential to Achieve Goals: Fair  Visit Information  Last OT Received On: 05/19/13 Assistance Needed: +1 History of Present Illness: With past medical history of coronary artery disease status post cath that revealed severe LAD disease which  was treated with a DES stent on 07/30/2012, hypertension mild aortic stenosis that comes in for chest discomfort and shortness of breath with exertion. Found to have NSTEMI       Prior Functioning  Home Living Family/patient expects to be discharged to:: Assisted living Home Equipment: Grab bars - toilet;Grab bars - tub/shower;Walker - 2 wheels;Cane - single point Additional Comments: lives at [facility] in independent living, they have continuum of care and pt can got to rehab/snf side as indidcated Prior Function Level of Independence: Independent with assistive device(s) Comments: Pt. uses scooter for going to meals. Communication Communication: No difficulties Dominant Hand: Right    Vision/Perception Vision - History Baseline Vision: Wears glasses only for reading Patient Visual Report: No change from baseline   Cognition  Cognition Arousal/Alertness: Awake/alert Behavior During Therapy: WFL for tasks assessed/performed Overall Cognitive Status: Within Functional Limits for tasks assessed    Extremity/Trunk Assessment Upper Extremity Assessment Upper Extremity Assessment: Generalized weakness;Overall [facility] for tasks assessed Lower Extremity Assessment Lower Extremity Assessment: Overall WFL for tasks assessed;Generalized weakness;Defer to PT evaluation Cervical / Trunk Assessment Cervical / Trunk Assessment: Kyphotic    Mobility Bed Mobility Bed Mobility: Supine to Sit;Sitting - Scoot to Edge of Bed Supine to Sit: 6: Modified independent (Device/Increase time);HOB elevated Sitting - Scoot to Edge of Bed: 5: Supervision Details for Bed Mobility Assistance: pt requires only minimal cues to scoot out for optimal leverage for sit to stand, and performs with HOB up to sit up but physically able with increased time Transfers Transfers: Sit to Stand;Stand to Sit Sit to Stand: 4: Min assist;3: Mod assist;From bed;From chair/3-in-1;With upper extremity assist;With  armrests Stand to Sit: 3: Mod assist;With upper extremity assist;With armrests;To chair/3-in-1;To toilet Details for Transfer Assistance: under arm assist at left, cues to scoot out and push up/pull on grab bar, with assist mostly for initial lift off especially from low toilet and noting fair to good stability upon standing with min assist and Mod-Max A to pull up underpants; unable to assume fully erect posture        Balance Balance Balance Assessed: Yes Static Sitting Balance Static Sitting - Balance Support: Bilateral upper extremity supported;Feet supported;No upper extremity supported Static Standing Balance Static Standing - Balance Support: Bilateral upper extremity supported Note: Pt w/ posterior LOB x2 during standing activity from EOB & during peri-care - Pt required Min-mod assist to correct.   End of Session OT - End of Session Equipment Utilized During Treatment: Gait belt;Rolling walker;Other (comment) (3:1) Activity Tolerance: Patient tolerated treatment well Patient left: in chair;with call bell/phone within reach;with chair alarm set Nurse Communication: Mobility status;Precautions  GO     Alm Bustard 05/19/2013, 8:57 AM

## 2013-05-23 LAB — CULTURE, BLOOD (ROUTINE X 2): Culture: NO GROWTH

## 2013-05-27 ENCOUNTER — Encounter: Payer: Self-pay | Admitting: Nurse Practitioner

## 2013-05-27 ENCOUNTER — Ambulatory Visit (INDEPENDENT_AMBULATORY_CARE_PROVIDER_SITE_OTHER): Payer: Medicare Other | Admitting: Nurse Practitioner

## 2013-05-27 VITALS — BP 110/60 | HR 64 | Ht 64.0 in | Wt 179.0 lb

## 2013-05-27 DIAGNOSIS — I2510 Atherosclerotic heart disease of native coronary artery without angina pectoris: Secondary | ICD-10-CM

## 2013-05-27 DIAGNOSIS — I10 Essential (primary) hypertension: Secondary | ICD-10-CM

## 2013-05-27 LAB — CBC WITH DIFFERENTIAL/PLATELET
Basophils Absolute: 0 10*3/uL (ref 0.0–0.1)
Basophils Relative: 0.6 % (ref 0.0–3.0)
Eosinophils Absolute: 0.2 10*3/uL (ref 0.0–0.7)
Eosinophils Relative: 2.6 % (ref 0.0–5.0)
HCT: 41.1 % (ref 36.0–46.0)
Hemoglobin: 13.6 g/dL (ref 12.0–15.0)
Lymphocytes Relative: 20.9 % (ref 12.0–46.0)
Lymphs Abs: 1.4 10*3/uL (ref 0.7–4.0)
MCHC: 33.1 g/dL (ref 30.0–36.0)
MCV: 88.6 fl (ref 78.0–100.0)
Monocytes Absolute: 0.5 10*3/uL (ref 0.1–1.0)
Monocytes Relative: 8.2 % (ref 3.0–12.0)
Neutro Abs: 4.5 10*3/uL (ref 1.4–7.7)
Neutrophils Relative %: 67.7 % (ref 43.0–77.0)
Platelets: 167 10*3/uL (ref 150.0–400.0)
RBC: 4.64 Mil/uL (ref 3.87–5.11)
RDW: 16 % — ABNORMAL HIGH (ref 11.5–14.6)
WBC: 6.6 10*3/uL (ref 4.5–10.5)

## 2013-05-27 LAB — BASIC METABOLIC PANEL
BUN: 18 mg/dL (ref 6–23)
CO2: 28 mEq/L (ref 19–32)
Calcium: 8.8 mg/dL (ref 8.4–10.5)
Chloride: 105 mEq/L (ref 96–112)
Creatinine, Ser: 0.8 mg/dL (ref 0.4–1.2)
GFR: 69.87 mL/min (ref >60.00)
Glucose, Bld: 101 mg/dL — ABNORMAL HIGH (ref 70–99)
Potassium: 3.4 mEq/L — ABNORMAL LOW (ref 3.5–5.1)
Sodium: 141 mEq/L (ref 135–145)

## 2013-05-27 NOTE — Patient Instructions (Addendum)
We need to recheck your labs today  Try to elevate your legs as much as possible  Needs to be on a low salt diet  Stay on current medicines  See Dr. [place] back in March as planned  Call the [facility] office at [phone] if you have any questions, problems or concerns.

## 2013-05-27 NOTE — Progress Notes (Signed)
Jacqueline [name] Date of Birth: [birth year] Medical Record #[id number]  History of Present Illness: Jacqueline [name] is seen back today for a post hospital visit. Seen for Jacqueline [name]. She is 79 years of age. Has known CAD with past DES to the LAD in February 2014 with residual disease in small diagnonals and OM - treated medically - on DAPT with aspirin and Plavix, HTN, HLD, hypothyroidism and mild AS.  Presented to the hospital earlier this month with chest pain/shortness of breath - had had recent pneumonia. EKG negative. Troponin positive. Felt to be more of demand ischemia in the setting of CHF/bronchitis. She improved with diuresis. Maintained on Coreg and ARB therapy.   Comes back today. Here with a caregiver from [facility]. Feels like she is getting stronger with her rehab/therapy. No chest pain. Breathing has improved. No cough. Some swelling in her legs but she admits that she sits with them down quite a bit. Probably gets too much salt as well. Not dizzy. No falls. Some shoulder pain - better with Tylenol.   Current Outpatient Prescriptions  Medication Sig Dispense Refill  . acetaminophen (TYLENOL) 325 MG tablet Take 650 mg by mouth every 6 (six) hours as needed for pain.      Marland Kitchen aspirin EC 81 MG tablet Take 81 mg by mouth daily.      Marland Kitchen atorvastatin (LIPITOR) 20 MG tablet Take 20 mg by mouth daily at 6 PM.      . carvedilol (COREG) 25 MG tablet Take 25 mg by mouth 2 (two) times daily with a meal.      . doxycycline (VIBRA-TABS) 100 MG tablet Take 1 tablet (100 mg total) by mouth every 12 (twelve) hours.  8 tablet  0  . furosemide (LASIX) 40 MG tablet Take 1 tablet (40 mg total) by mouth daily.  90 tablet  3  . levothyroxine (SYNTHROID, LEVOTHROID) 125 MCG tablet Take 125 mcg by mouth daily.      . nitroGLYCERIN (NITROSTAT) 0.4 MG SL tablet Place 0.4 mg under the tongue every 5 (five) minutes as needed for chest pain.      Marland Kitchen olmesartan (BENICAR) 40 MG tablet Take 40 mg by mouth daily.       . potassium chloride (K-DUR) 10 MEQ tablet Take 10 mEq by mouth daily.       No current facility-administered medications for this visit.    No Known Allergies  Past Medical History  Diagnosis Date  . Hypertension   . Mild aortic stenosis     a. 01/2010 Echo: Nl EF, mild AS/AI, mild LAE, mild PAH.  . Diabetes mellitus   . Gout   . Hypothyroidism   . CAD (coronary artery disease)     a. 2-08/2012: Cath/PCI: LM 10-20d, LAD 60-70p, 4m (Rotablator->2.5x32 Promus Premier DES), D1 70-80ost, D2- small-90 ost/95p, LCX large, OM1 40p, OM2 small 80-90, RCA dominant 88m, Ef 65-70%.  . Bilateral leg weakness     chronic  . Hyperlipidemia   . CHF (congestive heart failure)   . Shortness of breath     with chf & lying flat    Past Surgical History  Procedure Laterality Date  . Left knee replacement    . Breast biopsies    . Cataract extraction    . Coronary angioplasty with stent placement  march 2014    History  Smoking status  . Never Smoker   Smokeless tobacco  . Never Used    History  Alcohol Use No  Family History  Problem Relation Age of Onset  . Alzheimer's disease Brother   . Heart disease Father 70    Review of Systems: The review of systems is per the HPI.  All other systems were reviewed and are negative.  Physical Exam: BP 110/60  Pulse 64  Ht 5\' 4"  (1.626 m)  Wt 179 lb (81.194 kg)  BMI 30.71 kg/m2 Patient is very pleasant and in no acute distress. Skin is warm and dry. Color is normal.  HEENT is unremarkable. Normocephalic/atraumatic. PERRL. Sclera are nonicteric. Neck is supple. No masses. No JVD. Lungs are clear. Cardiac exam shows a regular rate and rhythm. Outflow murmur noted. Abdomen is soft. Extremities are with 1+ edema. Gait and ROM are intact. She is using a walker. No gross neurologic deficits noted.  Wt Readings from Last 3 Encounters:  05/27/13 179 lb (81.194 kg)  05/19/13 179 lb 14.4 oz (81.602 kg)  03/19/13 196 lb (88.905 kg)      LABORATORY DATA: PENDING  Lab Results  Component Value Date   WBC 7.3 05/18/2013   HGB 13.3 05/18/2013   HCT 39.9 05/18/2013   PLT 132* 05/18/2013   GLUCOSE 92 05/19/2013   ALT 81* 12/29/2011   AST 155* 12/29/2011   NA 140 05/19/2013   K 3.7 05/19/2013   CL 103 05/19/2013   CREATININE 0.67 05/19/2013   BUN 20 05/19/2013   CO2 26 05/19/2013   INR 1.03 08/02/2012   HGBA1C 5.9* 07/31/2012   Echo Study Conclusions from December 2014  - Left ventricle: The cavity size was normal. Wall thickness was increased in a pattern of mild LVH. Systolic function was normal. The estimated ejection fraction was in the range of 60% to 65%. - Aortic valve: Poorly visualized Calcified There was mild stenosis. Mild regurgitation. - Mitral valve: Calcified annulus. Mildly thickened leaflets . Mild regurgitation. - Left atrium: The atrium was moderately dilated. - Right atrium: The atrium was mildly dilated. - Pulmonary arteries: PA peak pressure: 51mm Hg (S).   Assessment / Plan: 1. NSTEMI - felt to be from demand ischemia from CHF/bronchitis- managed medically.  2. Diastolic HF - exacerbated by recent pneumonia - looks compensated.   3. CAD - on DAPT  4. HTN - BP looks good  Overall she looks like she is making progress. Continue with current therapies. Sees Jacqueline [name] back in March. Recheck her labs today. Try to limit her salt and keep her legs up more.   Patient is agreeable to this plan and will call if any problems develop in the interim.   Jacqueline Macadamia, RN, ANP-C [facility] [address] [place], [place]  [address]

## 2013-06-27 ENCOUNTER — Ambulatory Visit (INDEPENDENT_AMBULATORY_CARE_PROVIDER_SITE_OTHER): Payer: Medicare Other

## 2013-06-27 VITALS — BP 108/56 | HR 65 | Resp 16

## 2013-06-27 DIAGNOSIS — L02619 Cutaneous abscess of unspecified foot: Secondary | ICD-10-CM

## 2013-06-27 DIAGNOSIS — L03039 Cellulitis of unspecified toe: Secondary | ICD-10-CM

## 2013-06-27 DIAGNOSIS — M79676 Pain in unspecified toe(s): Secondary | ICD-10-CM

## 2013-06-27 DIAGNOSIS — L97509 Non-pressure chronic ulcer of other part of unspecified foot with unspecified severity: Secondary | ICD-10-CM

## 2013-06-27 DIAGNOSIS — M2040 Other hammer toe(s) (acquired), unspecified foot: Secondary | ICD-10-CM

## 2013-06-27 DIAGNOSIS — M79609 Pain in unspecified limb: Secondary | ICD-10-CM

## 2013-06-27 MED ORDER — CEPHALEXIN 500 MG PO CAPS
500.0000 mg | ORAL_CAPSULE | Freq: Three times a day (TID) | ORAL | Status: DC
Start: 2013-06-27 — End: 2013-08-28

## 2013-06-27 MED ORDER — SILVER SULFADIAZINE 1 % EX CREA
1.0000 | TOPICAL_CREAM | Freq: Every day | CUTANEOUS | Status: DC
Start: 2013-06-27 — End: 2014-02-12

## 2013-06-27 NOTE — Progress Notes (Signed)
   Subjective:    Patient ID: Norval GableMary E Blucher, female    DOB: 01/02/1917, 80 y.o.   MRN: [id number]  HPI Comments: "My 4th toe is sore"  Patient has 4th toe right that is swollen, red, open sore to lateral side of toe, the tip is callused. Its been this way for a few weeks. Very sensitive. She went to her PCP on Saturday. Did bloodwork to check uric acid said it was "on the high side of normal" They put her on antibiotic and cream. She hasn't noticed any changes since taking the antibiotic. Shoes and her compression stockings are uncomfortable. She is wearing open toe sandals today.  Pt had xrays done on right foot in December when she was in the hospital for her heart.     Review of Systems  HENT: Positive for hearing loss.   Respiratory: Positive for shortness of breath.   Cardiovascular: Positive for leg swelling.  All other systems reviewed and are negative.       Objective:   Physical Exam Vascular status as follows pedal pulses palpable DP +2/4 bilateral PT thready nonpalpable bilateral. Refill time 4 seconds all digits skin temperature is warm turgor diminished there is no edema rubor noted mild varicosities noted bilateral. Dermatologically skin color pigment normal hair growth absent. Nails somewhat criptotic incurvated friable 1 through 5 bilateral. Patient does have keratoses with distal hemorrhage a keratoses fourth digit right foot which on debridement reveals underlying ulceration with localized cellulitis is mild. Discharge or drainage being noted. Is almost a blistering around the fourth toe distal tuft and lateral and proximal nail fold. The toe is swollen edematous and erythematous consistent with localized cellulitis there is no a sitting size noted of both the toe joint itself. Orthopedic biomechanical exam reveals rectus foot type with severely contracted semirigid toes with hammertoe/claw toe type contractures 234 and 5 bilateral patient currently wearing a pair of sandals  previously was include shoe wear her shoes too short or too shallow. To allow for the contractures previous x-rays reviewed and new x-rays taken at this time for comparison no new significant erosions are noted however there is increased in overall soft tissue swelling of the toe likely with cellulitis.       Assessment & Plan:  Assessment this time is ulceration distal tuft fourth digit right foot secondary to claw toe/hammertoe contracture. Patient keratotic lesion is debrided revealing underlying ulceration with discharge and drainage no culture status time is her likely contaminant and having a previous antibiotic regimens will not likely a true reading. At this time patient is placed empirically on a regimen of cephalexin 500 mg 3 times a day x10 days. Patient is also given a prescription for Silvadene cream to be applied daily to the affected area after cleansing with soap and water daily and applying a clean dry sterile dressing to the fourth toe. Reappointed 2 weeks for followup maintain the sandals for open toe shoes at all times until resolved. Recheck in 2 weeks for resolution of infection and continue monitoring the future maybe candidate for more invasive options considering digital contracture tendon release or possible surgical interventions may be considered. Contact us in changes or difficulties or applications for medications occur. If any increasing pain swelling or fever chills developed contact us immediately or go to the nearest emergency room.  Alvan Dameichard Avneet Ashmore DPM

## 2013-06-27 NOTE — Patient Instructions (Signed)
ANTIBACTERIAL SOAP INSTRUCTIONS  THE DAY AFTER PROCEDURE  Please follow the instructions your doctor has marked.   Shower as usual. Before getting out, place a drop of antibacterial liquid soap (Dial) on a wet, clean washcloth.  Gently wipe washcloth over affected area.  Afterward, rinse the area with warm water.  Blot the area dry with a soft cloth and cover with antibiotic ointment (neosporin, polysporin, bacitracin) and band aid or gauze and tape  Place 3-4 drops of antibacterial liquid soap in a quart of warm tap water.  Submerge foot into water for 20 minutes.  If bandage was applied after your procedure, leave on to allow for easy lift off, then remove and continue with soak for the remaining time.  Next, blot area dry with a soft cloth and cover with a bandage.  Apply other medications as directed by your doctor, such as cortisporin otic solution (eardrops) or neosporin antibiotic ointment  Applied the prescribed Silvadene cream as instructed once daily after cleansing with soap and water and dressing change application with Silvadene. Maintain dressing changes daily until further instructed keep followup appointment with him next 2 weeks   Also maintain antibiotic regimen as instructed cephalexin 500 mg one capsule 3 times daily with food

## 2013-07-15 ENCOUNTER — Ambulatory Visit (INDEPENDENT_AMBULATORY_CARE_PROVIDER_SITE_OTHER): Payer: Medicare Other

## 2013-07-15 VITALS — BP 141/66 | HR 58 | Resp 16

## 2013-07-15 DIAGNOSIS — M2040 Other hammer toe(s) (acquired), unspecified foot: Secondary | ICD-10-CM

## 2013-07-15 DIAGNOSIS — M79676 Pain in unspecified toe(s): Secondary | ICD-10-CM

## 2013-07-15 DIAGNOSIS — L97509 Non-pressure chronic ulcer of other part of unspecified foot with unspecified severity: Secondary | ICD-10-CM

## 2013-07-15 DIAGNOSIS — M79609 Pain in unspecified limb: Secondary | ICD-10-CM

## 2013-07-15 NOTE — Progress Notes (Signed)
   Subjective:    Patient ID: Jacqueline [name], female    DOB: 05/10/1917, 80 y.o.   MRN: [id number]  HPI Comments: "Its still sore and swollen and red. I guess it is some better though"  Follow up 4th toe right  Toe Pain       Review of Systems no new changes or findings there is decreased redness and swelling of the toe fourth toe right foot.     Objective:   Physical Exam Vascular status is intact patient needs to have pedal pulses palpable DP postal for bilateral PT thready plus one over 4 on the right nonpalpable on the left patient does have rectus toes more so on the left rigid digital contractures of toes 2 through 4 and 5 of the right. The right fourth toe still edematous although no increased temperature no ascending psoas lymphangitis eschar the distal tuft is identified and debrided down to a pinpoint bleeding proximally 2 mm ulceration distal tuft of the fourth toe still present. Goes down to the dermal subdermal junction there is no current discharge drainage no ascending cellulitis. Patient also some thickening yellowing discoloration and brittleness of the nails she thinks that she can wearing compression stockings may also be causing some of her contracture no history of stroke although did have history of heart attack is also has some knee surgery on the left knee which is the affected side.       Assessment & Plan:  Assessment ulceration secondary to hammertoe deformity fourth toe right foot the infection appears to be cleared are stable she completed her antibiotic for advised to continue with Silvadene gauze dressing after the debridement today. Patient is also dispensed and tubercle padding to cushion the toe and also the area we'll try open toe compression stockings versus a closed toe stocking to help reduce the contractures on the toe. Reappointed in 3 weeks for further followup and reevaluation  Alvan Dameichard Arsalan Brisbin DPM

## 2013-07-15 NOTE — Patient Instructions (Signed)
ANTIBACTERIAL SOAP INSTRUCTIONS  THE DAY AFTER PROCEDURE  Please follow the instructions your doctor has marked.   Shower as usual. Before getting out, place a drop of antibacterial liquid soap (Dial) on a wet, clean washcloth.  Gently wipe washcloth over affected area.  Afterward, rinse the area with warm water.  Blot the area dry with a soft cloth and cover with antibiotic ointment (neosporin, polysporin, bacitracin) and band aid or gauze and tape  Place 3-4 drops of antibacterial liquid soap in a quart of warm tap water.  Submerge foot into water for 20 minutes.  If bandage was applied after your procedure, leave on to allow for easy lift off, then remove and continue with soak for the remaining time.  Next, blot area dry with a soft cloth and cover with a bandage.  Apply other medications as directed by your doctor, such as cortisporin otic solution (eardrops) or neosporin antibiotic ointment  Continue to apply Silvadene and a Band-Aid or gauze dressings fourth toe right foot daily after cleanse.  Also apply the tube foam cushion pads to the fourth toe keep pressure off the end of the toe  Consider using open toe compression stockings to prevent contracture of the toes

## 2013-07-18 ENCOUNTER — Telehealth: Payer: Self-pay | Admitting: *Deleted

## 2013-07-18 NOTE — Telephone Encounter (Addendum)
Pt request prescription for support hose.  I reviewed her latest visit, Dr Ralene CorkSikora does want to try open toed support hose, I do not as a compression strength.  I left a message for the pt to contact me concerning the facility she purchases the hose.  Pt called again informed [facility] [phone], for 20 -30 mmHg open toed support hose to the knee.

## 2013-08-08 ENCOUNTER — Ambulatory Visit (INDEPENDENT_AMBULATORY_CARE_PROVIDER_SITE_OTHER): Payer: Medicare Other

## 2013-08-08 VITALS — BP 120/60 | HR 57 | Resp 16

## 2013-08-08 DIAGNOSIS — M2040 Other hammer toe(s) (acquired), unspecified foot: Secondary | ICD-10-CM

## 2013-08-08 DIAGNOSIS — M79676 Pain in unspecified toe(s): Secondary | ICD-10-CM

## 2013-08-08 DIAGNOSIS — L97509 Non-pressure chronic ulcer of other part of unspecified foot with unspecified severity: Secondary | ICD-10-CM

## 2013-08-08 NOTE — Patient Instructions (Signed)
ANTIBACTERIAL SOAP INSTRUCTIONS  THE DAY AFTER PROCEDURE  Please follow the instructions your doctor has marked.   Shower as usual. Before getting out, place a drop of antibacterial liquid soap (Dial) on a wet, clean washcloth.  Gently wipe washcloth over affected area.  Afterward, rinse the area with warm water.  Blot the area dry with a soft cloth and cover with antibiotic ointment (neosporin, polysporin, bacitracin) and band aid or gauze and tape  Place 3-4 drops of antibacterial liquid soap in a quart of warm tap water.  Submerge foot into water for 20 minutes.  If bandage was applied after your procedure, leave on to allow for easy lift off, then remove and continue with soak for the remaining time.  Next, blot area dry with a soft cloth and cover with a bandage.  After washing everyday and dried thoroughly apply Silvadene cream bandage and a tube foam pad dressing to the toe maintain the open toe shoes or sandals and the open toe compression stockings.

## 2013-08-08 NOTE — Progress Notes (Signed)
   Subjective:    Patient ID: Jacqueline GableMary E [name], female    DOB: 03/20/1917, 80 y.o.   MRN: [id number]  HPI Comments: "Its still so sore."  Follow 4th toe right     Review of Systems no new changes or findings at this time to     Objective:   Physical Exam 3 objective findings as follows vascular status continues to be intact with pedal pulses palpable bilateral DP postal for PT one over 4. There is decreased sensation over patient in general has severe rigid digital contractures toes 2 through 4 the fourth digit right foot has distal clavus with hemorrhage a keratoses is debrided away revealing still in 2-3 mm ulceration distal tuft fourth toe down to the dermal subdermal junction. No purulent discharge drainage mild serous drainage is noted patient maintaining open toed compression socks and open toed sandals no other wounds no other signs of infection patient does have diffuse ridge rigidity and stiffness of his feet and toes consistent with arthropathy affecting the forefoot midfoot both feet.       Assessment & Plan:  Assessment this time ulceration secondary hammertoe deformity fourth toe right foot the infection appears to be clear there is no a.c. psoas lymphangitis continue with Silvadene and Band-Aid dressing tube foam padding to cushion the toe reevaluate in 3-4 weeks for followup  Alvan Dameichard Juriel Cid DPM

## 2013-08-28 ENCOUNTER — Ambulatory Visit (INDEPENDENT_AMBULATORY_CARE_PROVIDER_SITE_OTHER): Payer: Medicare Other | Admitting: Cardiology

## 2013-08-28 ENCOUNTER — Encounter: Payer: Self-pay | Admitting: Cardiology

## 2013-08-28 VITALS — BP 174/69 | HR 58 | Ht 64.0 in | Wt 188.0 lb

## 2013-08-28 DIAGNOSIS — I359 Nonrheumatic aortic valve disorder, unspecified: Secondary | ICD-10-CM

## 2013-08-28 DIAGNOSIS — I350 Nonrheumatic aortic (valve) stenosis: Secondary | ICD-10-CM

## 2013-08-28 DIAGNOSIS — I10 Essential (primary) hypertension: Secondary | ICD-10-CM

## 2013-08-28 DIAGNOSIS — I2510 Atherosclerotic heart disease of native coronary artery without angina pectoris: Secondary | ICD-10-CM

## 2013-08-28 NOTE — Patient Instructions (Addendum)
Continue your current therapy. Keep an eye on your blood pressure.  We will get a copy of your lab work from Dr. Creta LevinStallings office.  I will see you in 6 months.

## 2013-08-28 NOTE — Progress Notes (Signed)
Jacqueline [name] Date of Birth: 1916/08/22 Medical Record #[id number]  History of Present Illness: Jacqueline [name] is seen for a follow up visit. She has known CAD with past DES to the LAD in February 2014 with residual disease in small diagnonals and OM - treated medically, HTN, HLD, hypothyroidism and mild AS.  Presented to the hospital in Dec with chest pain/shortness of breath - had had recent pneumonia. EKG negative. Troponin positive. Felt to be more of demand ischemia in the setting of CHF/bronchitis. She improved with diuresis. Maintained on Coreg and ARB therapy.   On follow up today she states she is doing very well. No chest pain. She has lost 8 lbs. BP monitored weekly at home and typically is 130 systolic. Wears support hose for chronic swelling. Now off Plavix.     Current Outpatient Prescriptions  Medication Sig Dispense Refill  . acetaminophen (TYLENOL) 325 MG tablet Take 650 mg by mouth every 6 (six) hours as needed for pain.      Marland Kitchen aspirin EC 81 MG tablet Take 81 mg by mouth daily.      Marland Kitchen atorvastatin (LIPITOR) 20 MG tablet Take 20 mg by mouth daily at 6 PM.      . carvedilol (COREG) 25 MG tablet Take 25 mg by mouth 2 (two) times daily with a meal.      . furosemide (LASIX) 40 MG tablet Take 1 tablet (40 mg total) by mouth daily.  90 tablet  3  . levothyroxine (SYNTHROID, LEVOTHROID) 125 MCG tablet Take 125 mcg by mouth daily.      . nitroGLYCERIN (NITROSTAT) 0.4 MG SL tablet Place 0.4 mg under the tongue every 5 (five) minutes as needed for chest pain.      Marland Kitchen olmesartan (BENICAR) 40 MG tablet Take 40 mg by mouth daily.      . potassium chloride (K-DUR) 10 MEQ tablet Take 10 mEq by mouth daily.      . silver sulfADIAZINE (SILVADENE) 1 % cream Apply 1 application topically daily.  50 g  0   No current facility-administered medications for this visit.    No Known Allergies  Past Medical History  Diagnosis Date  . Hypertension   . Mild aortic stenosis     a. 01/2010 Echo: Nl  EF, mild AS/AI, mild LAE, mild PAH.  . Diabetes mellitus   . Gout   . Hypothyroidism   . CAD (coronary artery disease)     a. 2-08/2012: Cath/PCI: LM 10-20d, LAD 60-70p, 36m (Rotablator->2.5x32 Promus Premier DES), D1 70-80ost, D2- small-90 ost/95p, LCX large, OM1 40p, OM2 small 80-90, RCA dominant 49m, Ef 65-70%.  . Bilateral leg weakness     chronic  . Hyperlipidemia   . CHF (congestive heart failure)   . Shortness of breath     with chf & lying flat    Past Surgical History  Procedure Laterality Date  . Left knee replacement    . Breast biopsies    . Cataract extraction    . Coronary angioplasty with stent placement  march 2014    History  Smoking status  . Never Smoker   Smokeless tobacco  . Never Used    History  Alcohol Use No    Family History  Problem Relation Age of Onset  . Alzheimer's disease Brother   . Heart disease Father 68    Review of Systems: The review of systems is per the HPI.  All other systems were reviewed and are negative.  Physical Exam: BP 174/69  Pulse 58  Ht 5\' 4"  (1.626 m)  Wt 188 lb (85.276 kg)  BMI 32.25 kg/m2 Patient is very pleasant and in no acute distress. Skin is warm and dry. Color is normal.  HEENT is unremarkable. Normocephalic/atraumatic. PERRL. Sclera are nonicteric. Neck is supple. No masses. No JVD. Lungs are clear. Cardiac exam shows a regular rate and rhythm. Outflow murmur noted. Abdomen is soft. Extremities are with 1+ edema with support hose in place. Gait and ROM are intact. She is using a walker. No gross neurologic deficits noted.  Wt Readings from Last 3 Encounters:  08/28/13 188 lb (85.276 kg)  05/27/13 179 lb (81.194 kg)  05/19/13 179 lb 14.4 oz (81.602 kg)     LABORATORY DATA: PENDING  Lab Results  Component Value Date   WBC 6.6 05/27/2013   HGB 13.6 05/27/2013   HCT 41.1 05/27/2013   PLT 167.0 05/27/2013   GLUCOSE 101* 05/27/2013   ALT 81* 12/29/2011   AST 155* 12/29/2011   NA 141 05/27/2013   K  3.4* 05/27/2013   CL 105 05/27/2013   CREATININE 0.8 05/27/2013   BUN 18 05/27/2013   CO2 28 05/27/2013   INR 1.03 08/02/2012   HGBA1C 5.9* 07/31/2012   Echo Study Conclusions from December 2014  - Left ventricle: The cavity size was normal. Wall thickness was increased in a pattern of mild LVH. Systolic function was normal. The estimated ejection fraction was in the range of 60% to 65%. - Aortic valve: Poorly visualized Calcified There was mild stenosis. Mild regurgitation. - Mitral valve: Calcified annulus. Mildly thickened leaflets . Mild regurgitation. - Left atrium: The atrium was moderately dilated. - Right atrium: The atrium was mildly dilated. - Pulmonary arteries: PA peak pressure: 51mm Hg (S).   Assessment / Plan: 1. CAD s/p DES of the LAD one year ago. Clinically doing well. Continue ASA and medical Rx.  2. Chronic Diastolic HF well compensated.  3. Mild Aortic stenosis.   4. HTN - BP well controlled at home- will continue to monitor. Continue current antihypertensive therapy.  5. Chronic edema. Continue current diuretic dose and support hose.

## 2013-08-29 ENCOUNTER — Ambulatory Visit (INDEPENDENT_AMBULATORY_CARE_PROVIDER_SITE_OTHER): Payer: Medicare Other

## 2013-08-29 VITALS — BP 130/72 | HR 55 | Resp 16

## 2013-08-29 DIAGNOSIS — M2040 Other hammer toe(s) (acquired), unspecified foot: Secondary | ICD-10-CM

## 2013-08-29 DIAGNOSIS — Q828 Other specified congenital malformations of skin: Secondary | ICD-10-CM

## 2013-08-29 DIAGNOSIS — L97509 Non-pressure chronic ulcer of other part of unspecified foot with unspecified severity: Secondary | ICD-10-CM

## 2013-08-29 NOTE — Progress Notes (Signed)
   Subjective:    Patient ID: Jacqueline [name], female    DOB: 09/08/1916, 80 y.o.   MRN: [id number]  HPI Comments: "It looks better, but its still so sore"  Toe ulcer - Follow up 4th toe right      Review of Systems no new changes or significant findings noted     Objective:   Physical Exam Partially objective findings follows patient has pedal pulses palpable has rigid digital contractures resolving ulcer of the fourth toe right foot has residual porokeratosis which is debrided at this time. Tube foam padding dispensed maintain tube foam padding and accommodative shoes at all times suggest a 3 month followup for palliative care in the future as needed the ulcers resolved is no discharge or drainage patient continues to have hammertoe deformity with arthropathy       Assessment & Plan:  Assessment hammertoe deformity with painful keratoses distal clavus fourth toe right foot keratosis is debrided return for future palliative care and as-needed basis contact us is any exacerbation or infection were to occur. Next  Alvan Dameichard Sakai Wolford DPM

## 2013-08-29 NOTE — Patient Instructions (Signed)

## 2013-10-07 ENCOUNTER — Other Ambulatory Visit: Payer: Self-pay | Admitting: Cardiology

## 2013-10-15 ENCOUNTER — Other Ambulatory Visit: Payer: Self-pay | Admitting: Cardiology

## 2013-10-17 NOTE — Telephone Encounter (Signed)
Patient no longer takes plavix.

## 2013-10-17 NOTE — Telephone Encounter (Signed)
Is the patient still to be taking the plavix? Please advise. Thanks, MI

## 2014-02-12 ENCOUNTER — Encounter: Payer: Self-pay | Admitting: Cardiology

## 2014-02-12 ENCOUNTER — Ambulatory Visit (INDEPENDENT_AMBULATORY_CARE_PROVIDER_SITE_OTHER): Payer: Medicare Other | Admitting: Cardiology

## 2014-02-12 VITALS — BP 128/80 | HR 80 | Ht 63.0 in | Wt 189.4 lb

## 2014-02-12 DIAGNOSIS — I10 Essential (primary) hypertension: Secondary | ICD-10-CM

## 2014-02-12 DIAGNOSIS — I209 Angina pectoris, unspecified: Secondary | ICD-10-CM

## 2014-02-12 DIAGNOSIS — I2510 Atherosclerotic heart disease of native coronary artery without angina pectoris: Secondary | ICD-10-CM

## 2014-02-12 DIAGNOSIS — I359 Nonrheumatic aortic valve disorder, unspecified: Secondary | ICD-10-CM

## 2014-02-12 DIAGNOSIS — I350 Nonrheumatic aortic (valve) stenosis: Secondary | ICD-10-CM

## 2014-02-12 DIAGNOSIS — I25119 Atherosclerotic heart disease of native coronary artery with unspecified angina pectoris: Secondary | ICD-10-CM

## 2014-02-12 NOTE — Progress Notes (Signed)
Jacqueline [name] Date of Birth: 06/25/1916 Medical Record #[id number]  History of Present Illness: [name] is seen for a follow up visit. She has known CAD with past DES to the LAD in February 2014 with residual disease in small diagnonals and OM - treated medically, HTN, HLD, hypothyroidism and mild AS.  On follow up today she reports she is doing well. No chest pain or SOB. Recent chest pain but cough now resolved. Swelling is stable. No palpitations. Feels like she had a good week.     Current Outpatient Prescriptions  Medication Sig Dispense Refill  . acetaminophen (TYLENOL) 325 MG tablet Take 650 mg by mouth every 6 (six) hours as needed for pain.      Marland Kitchen aspirin EC 81 MG tablet Take 81 mg by mouth daily.      Marland Kitchen atorvastatin (LIPITOR) 20 MG tablet Take 20 mg by mouth daily at 6 PM.      . BENICAR 40 MG tablet TAKE 1 TABLET DAILY  90 tablet  1  . carvedilol (COREG) 25 MG tablet TAKE 1 TABLET TWICE A DAY WITH MEALS  180 tablet  2  . furosemide (LASIX) 40 MG tablet Take 1 tablet (40 mg total) by mouth daily.  90 tablet  3  . levothyroxine (SYNTHROID, LEVOTHROID) 125 MCG tablet Take 125 mcg by mouth daily.      . nitroGLYCERIN (NITROSTAT) 0.4 MG SL tablet Place 0.4 mg under the tongue every 5 (five) minutes as needed for chest pain.      . potassium chloride (K-DUR) 10 MEQ tablet Take 10 mEq by mouth daily.       No current facility-administered medications for this visit.    No Known Allergies  Past Medical History  Diagnosis Date  . Hypertension   . Mild aortic stenosis     a. 01/2010 Echo: Nl EF, mild AS/AI, mild LAE, mild PAH.  . Diabetes mellitus   . Gout   . Hypothyroidism   . CAD (coronary artery disease)     a. 2-08/2012: Cath/PCI: LM 10-20d, LAD 60-70p, 84m (Rotablator->2.5x32 Promus Premier DES), D1 70-80ost, D2- small-90 ost/95p, LCX large, OM1 40p, OM2 small 80-90, RCA dominant 3m, Ef 65-70%.  . Bilateral leg weakness     chronic  . Hyperlipidemia   . CHF  (congestive heart failure)   . Shortness of breath     with chf & lying flat    Past Surgical History  Procedure Laterality Date  . Left knee replacement    . Breast biopsies    . Cataract extraction    . Coronary angioplasty with stent placement  march 2014    History  Smoking status  . Never Smoker   Smokeless tobacco  . Never Used    History  Alcohol Use No    Family History  Problem Relation Age of Onset  . Alzheimer's disease Brother   . Heart disease Father 62    Review of Systems: The review of systems is per the HPI.  All other systems were reviewed and are negative.  Physical Exam: BP 128/80  Pulse 80  Ht  (1.6 m)  Wt 189 lb 6.4 oz (85.911 kg)  BMI 33.56 kg/m2 Patient is very pleasant and in no acute distress. Skin is warm and dry. Color is normal.  HEENT is unremarkable. Normocephalic/atraumatic. PERRL. Sclera are nonicteric. Neck is supple. No masses. No JVD. Lungs are clear. Cardiac exam shows a regular rate and rhythm. Outflow murmur  noted. Abdomen is soft. Extremities are with 1+ edema with support hose in place. Gait and ROM are intact. She is using a walker. No gross neurologic deficits noted.  Wt Readings from Last 3 Encounters:  02/12/14 189 lb 6.4 oz (85.911 kg)  08/28/13 188 lb (85.276 kg)  05/27/13 179 lb (81.194 kg)     LABORATORY DATA: PENDING  Lab Results  Component Value Date   WBC 6.6 05/27/2013   HGB 13.6 05/27/2013   HCT 41.1 05/27/2013   PLT 167.0 05/27/2013   GLUCOSE 101* 05/27/2013   ALT 81* 12/29/2011   AST 155* 12/29/2011   NA 141 05/27/2013   K 3.4* 05/27/2013   CL 105 05/27/2013   CREATININE 0.8 05/27/2013   BUN 18 05/27/2013   CO2 28 05/27/2013   INR 1.03 08/02/2012   HGBA1C 5.9* 07/31/2012    Assessment / Plan: 1. CAD s/p DES of the LAD February 2014. Clinically doing well. Continue ASA and medical Rx.  2. Chronic Diastolic HF well compensated. Continue current diuretic dose.  3. Mild Aortic stenosis.    4. HTN - BP well controlled. Continue current antihypertensive therapy.  5. Chronic edema. Continue current diuretic dose and support hose.  Patient reports lab work is followed by primary care.  I will see in 6 months.

## 2014-02-12 NOTE — Patient Instructions (Signed)
Continue your current therapy  I will see you in 6 months.   

## 2014-02-13 ENCOUNTER — Telehealth: Payer: Self-pay | Admitting: Cardiology

## 2014-02-13 NOTE — Telephone Encounter (Signed)
Pt left some message on recorder about her medicine. Please call.

## 2014-02-13 NOTE — Telephone Encounter (Signed)
Returned call to patient she stated her PCP Dr.Fred Andrey Campanile.Stated she is taking synthroid 112 mcg daily.Changes made in chart.

## 2014-05-14 ENCOUNTER — Encounter (HOSPITAL_COMMUNITY): Payer: Self-pay | Admitting: Cardiology

## 2014-08-13 ENCOUNTER — Ambulatory Visit (INDEPENDENT_AMBULATORY_CARE_PROVIDER_SITE_OTHER): Payer: Medicare Other | Admitting: Cardiology

## 2014-08-13 ENCOUNTER — Encounter: Payer: Self-pay | Admitting: Cardiology

## 2014-08-13 VITALS — BP 144/80 | HR 66 | Ht 63.0 in | Wt 187.4 lb

## 2014-08-13 DIAGNOSIS — I481 Persistent atrial fibrillation: Secondary | ICD-10-CM | POA: Diagnosis not present

## 2014-08-13 DIAGNOSIS — I4819 Other persistent atrial fibrillation: Secondary | ICD-10-CM

## 2014-08-13 DIAGNOSIS — I25119 Atherosclerotic heart disease of native coronary artery with unspecified angina pectoris: Secondary | ICD-10-CM | POA: Diagnosis not present

## 2014-08-13 DIAGNOSIS — I10 Essential (primary) hypertension: Secondary | ICD-10-CM

## 2014-08-13 DIAGNOSIS — I350 Nonrheumatic aortic (valve) stenosis: Secondary | ICD-10-CM

## 2014-08-13 DIAGNOSIS — I482 Chronic atrial fibrillation, unspecified: Secondary | ICD-10-CM | POA: Insufficient documentation

## 2014-08-13 LAB — BASIC METABOLIC PANEL
BUN: 23 mg/dL (ref 6–23)
CHLORIDE: 106 meq/L (ref 96–112)
CO2: 26 mEq/L (ref 19–32)
Calcium: 8.9 mg/dL (ref 8.4–10.5)
Creat: 1.24 mg/dL — ABNORMAL HIGH (ref 0.50–1.10)
Glucose, Bld: 104 mg/dL — ABNORMAL HIGH (ref 70–99)
Potassium: 4.2 mEq/L (ref 3.5–5.3)
SODIUM: 143 meq/L (ref 135–145)

## 2014-08-14 ENCOUNTER — Ambulatory Visit (INDEPENDENT_AMBULATORY_CARE_PROVIDER_SITE_OTHER): Payer: Medicare Other | Admitting: Pharmacist Clinician (PhC)/ Clinical Pharmacy Specialist

## 2014-08-14 DIAGNOSIS — I481 Persistent atrial fibrillation: Secondary | ICD-10-CM

## 2014-08-14 LAB — CBC WITH DIFFERENTIAL/PLATELET
Basophils Absolute: 0.1 10*3/uL (ref 0.0–0.1)
Basophils Relative: 1 % (ref 0–1)
Eosinophils Absolute: 0.2 10*3/uL (ref 0.0–0.7)
Eosinophils Relative: 3 % (ref 0–5)
HCT: 43.2 % (ref 36.0–46.0)
Hemoglobin: 14.2 g/dL (ref 12.0–15.0)
Lymphocytes Relative: 24 % (ref 12–46)
Lymphs Abs: 1.4 10*3/uL (ref 0.7–4.0)
MCH: 29.9 pg (ref 26.0–34.0)
MCHC: 32.9 g/dL (ref 30.0–36.0)
MCV: 90.9 fL (ref 78.0–100.0)
MPV: 12.5 fL — AB (ref 8.6–12.4)
Monocytes Absolute: 0.5 10*3/uL (ref 0.1–1.0)
Monocytes Relative: 8 % (ref 3–12)
Neutro Abs: 3.7 10*3/uL (ref 1.7–7.7)
Neutrophils Relative %: 64 % (ref 43–77)
Platelets: 142 10*3/uL — ABNORMAL LOW (ref 150–400)
RBC: 4.75 MIL/uL (ref 3.87–5.11)
RDW: 14.9 % (ref 11.5–15.5)
WBC: 5.8 10*3/uL (ref 4.0–10.5)

## 2014-08-14 LAB — TSH: TSH: 0.794 u[IU]/mL (ref 0.350–4.500)

## 2014-08-14 MED ORDER — APIXABAN 2.5 MG PO TABS: 2.5000 mg | ORAL_TABLET | Freq: Two times a day (BID) | ORAL | Status: DC

## 2014-08-14 NOTE — Progress Notes (Signed)
Jacqueline [name] Date of Birth: [birth year] Medical Record #[id number]  History of Present Illness: Jacqueline [name] is seen for follow up CAD. She has known CAD with past DES to the LAD in February 2014 with residual disease in small diagnonals and OM - treated medically, HTN, HLD, hypothyroidism and mild AS.  On follow up today she reports she is having a hard time getting up and feels tired. She is getting PT. She states sometimes her feet don't work. She does note her heart beat is irregular on her BP monitor. Her LE swelling is the same. She does have mild dyspnea but no chest pain. No history of bleeding, CVA, or TIA.     Current Outpatient Prescriptions  Medication Sig Dispense Refill  . acetaminophen (TYLENOL) 325 MG tablet Take 650 mg by mouth every 6 (six) hours as needed for pain.    Marland Kitchen aspirin EC 81 MG tablet Take 81 mg by mouth daily.    Marland Kitchen atorvastatin (LIPITOR) 20 MG tablet Take 20 mg by mouth daily at 6 PM.    . BENICAR 40 MG tablet TAKE 1 TABLET DAILY 90 tablet 1  . carvedilol (COREG) 25 MG tablet TAKE 1 TABLET TWICE A DAY WITH MEALS 180 tablet 2  . furosemide (LASIX) 40 MG tablet Take 1 tablet (40 mg total) by mouth daily. 90 tablet 3  . levothyroxine (SYNTHROID, LEVOTHROID) 112 MCG tablet Take 112 mcg by mouth daily before breakfast.    . nitroGLYCERIN (NITROSTAT) 0.4 MG SL tablet Place 0.4 mg under the tongue every 5 (five) minutes as needed for chest pain.    . potassium chloride (K-DUR,KLOR-CON) 10 MEQ tablet Take 10 mEq by mouth daily.     No current facility-administered medications for this visit.    No Known Allergies  Past Medical History  Diagnosis Date  . Hypertension   . Mild aortic stenosis     a. 01/2010 Echo: Nl EF, mild AS/AI, mild LAE, mild PAH.  . Diabetes mellitus   . Gout   . Hypothyroidism   . CAD (coronary artery disease)     a. 2-08/2012: Cath/PCI: LM 10-20d, LAD 60-70p, 57m (Rotablator->2.5x32 Promus Premier DES), D1 70-80ost, D2- small-90 ost/95p,  LCX large, OM1 40p, OM2 small 80-90, RCA dominant 24m, Ef 65-70%.  . Bilateral leg weakness     chronic  . Hyperlipidemia   . CHF (congestive heart failure)   . Shortness of breath     with chf & lying flat  . Atrial fibrillation     Past Surgical History  Procedure Laterality Date  . Left knee replacement    . Breast biopsies    . Cataract extraction    . Coronary angioplasty with stent placement  march 2014  . Left heart catheterization with coronary angiogram N/A 08/02/2012    Procedure: LEFT HEART CATHETERIZATION WITH CORONARY ANGIOGRAM;  Surgeon: Erdine Hulen M [place], MD;  Location: [facility] CATH LAB;  Service: Cardiovascular;  Laterality: N/A;  . Percutaneous coronary rotoblator intervention (pci-r) N/A 08/05/2012    Procedure: PERCUTANEOUS CORONARY ROTOBLATOR INTERVENTION (PCI-R);  Surgeon: Edmund Holcomb M [place], MD;  Location: [facility] CATH LAB;  Service: Cardiovascular;  Laterality: N/A;    History  Smoking status  . Never Smoker   Smokeless tobacco  . Never Used    History  Alcohol Use No    Family History  Problem Relation Age of Onset  . Alzheimer's disease Brother   . Heart disease Father 80    Review of Systems: The  review of systems is per the HPI.  All other systems were reviewed and are negative.  Physical Exam: BP 144/80 mmHg  Pulse 66  Ht 5\' 3"  (1.6 m)  Wt 187 lb 6.4 oz (85.004 kg)  BMI 33.20 kg/m2 Patient is very pleasant and in no acute distress. Skin is warm and dry. Color is normal.  HEENT is unremarkable. Normocephalic/atraumatic. PERRL. Sclera are nonicteric. Neck is supple. No masses. No JVD. Lungs are clear. Cardiac exam shows an irregular rate and rhythm. 2/6 outflow murmur noted. Abdomen is soft. Extremities are with 1+ edema with support hose in place. Gait and ROM are intact. She is using a walker. No gross neurologic deficits noted.  Wt Readings from Last 3 Encounters:  08/13/14 187 lb 6.4 oz (85.004 kg)  02/12/14 189 lb 6.4 oz (85.911 kg)  08/28/13 188 lb  (85.276 kg)     LABORATORY DATA: PENDING  Lab Results  Component Value Date   WBC 5.8 08/13/2014   HGB 14.2 08/13/2014   HCT 43.2 08/13/2014   PLT 142* 08/13/2014   GLUCOSE 104* 08/13/2014   ALT 81* 12/29/2011   AST 155* 12/29/2011   NA 143 08/13/2014   K 4.2 08/13/2014   CL 106 08/13/2014   CREATININE 1.24* 08/13/2014   BUN 23 08/13/2014   CO2 26 08/13/2014   TSH 0.794 08/13/2014   INR 1.03 08/02/2012   HGBA1C 5.9* 07/31/2012   Ecg today shows atrial fibrillation with rate 66 bpm. LVH. Afib is new since last tracing. I have personally reviewed and interpreted this study.  Assessment / Plan: 1. CAD s/p DES of the LAD February 2014. Clinically doing well. Continue ASA and medical Rx.  2. Chronic Diastolic HF well compensated. Continue current diuretic dose.  3. Atrial fibrillation. Newly diagnosed. Rate is well controlled and she is minimally symptomatic. She has a [place] score of 5 and is at high risk of stroke. We discussed anticoagulation and I would favor a NOAC. Will check baseline lab work including CBC, BMET, and TSH. Will ask our Pharm D to assess- ? If she needs dose adjustment due to her advanced age. I would favor continued rate control strategy since she is largely asymptomatic.   4. HTN - BP well controlled. Continue current antihypertensive therapy.  5. Chronic edema. Continue current diuretic dose and support hose.  6. Mild aortic stenosis.  Patient reports lab work is followed by primary care.  I will see in 3 months.

## 2014-08-14 NOTE — Progress Notes (Signed)
Pt was started on Eliquis for atrial fibrillation today.      Reviewed patients medication list.  Pt is not currently on any combined P-gp and strong CYP3A4 inhibitors/inducers (ketoconazole, traconazole, ritonavir, carbamazepine, phenytoin, rifampin, St. John's wort).  Reviewed labs.  SCr 1.24, Weight 85kg, CrCl- 36.4.  Dose per manufacturer guidelines would be 5 mg twice daily, however Dr. SwazilandJordan feels more comfortable because of her age starting her at 2.5 mg twice daily.   Hgb and HCT Within Normal Limits  A full discussion of the nature of anticoagulants has been carried out.  A benefit/risk analysis has been presented to the patient, so that they understand the justification for choosing anticoagulation with Eliquis at this time.  The need for compliance is stressed.  Pt is aware to take the medication twice daily.  Side effects of potential bleeding are discussed, including unusual colored urine or stools, coughing up blood or coffee ground emesis, nose bleeds or serious fall or head trauma.  Discussed signs and symptoms of stroke. The patient should avoid any OTC items containing aspirin or ibuprofen.  Avoid alcohol consumption.   Call if any signs of abnormal bleeding.  Discussed financial obligations and resolved any difficulty in obtaining medication.  Next lab test test in 6 months.

## 2014-08-15 ENCOUNTER — Telehealth: Payer: Self-pay | Admitting: Cardiology

## 2014-08-15 NOTE — Telephone Encounter (Signed)
Pt called complaining of nausea after taking Eliquis. I explained that this was not a common side effect but she could hold a dose and see if she feels better.  Corine ShelterLUKE Jeromey Kruer PA-C 08/15/2014 2:18 PM

## 2014-08-18 ENCOUNTER — Telehealth: Payer: Self-pay

## 2014-08-18 NOTE — Telephone Encounter (Signed)
Patient called when spoke to son last week he said you were having frequent urination and frequent non productive cough.Stated she coughed occasionally but it was not a problem.Stated she urinates frequently during day but is better in afternoon.Stated no burning.Dr.Jordan advised U/A.Stated she has appointment with PCP in April and will have U/A done there.Advised if gets worse call back and we can have [facility].

## 2014-08-21 ENCOUNTER — Other Ambulatory Visit: Payer: Self-pay

## 2014-08-21 ENCOUNTER — Telehealth: Payer: Self-pay | Admitting: *Deleted

## 2014-08-21 MED ORDER — FAMOTIDINE 20 MG PO TABS: ORAL_TABLET | ORAL | Status: DC

## 2014-08-21 NOTE — Telephone Encounter (Signed)
Returned call to patient she stated since she started taking Eliquis her stomach feels bad.Stated it is " rumbling".Stated makes her nervous.Stated she did not take this morning and she feels much better.Advised she will need to take.Spoke to Textron IncKristin Alvstad RPH.She advised to take Eliquis 2.5 mg twice a day with food.Advised to take Pepcid 20 mg daily for 1 to 2 weeks then take daily if needed.Advised to call back if does not help.

## 2014-08-21 NOTE — Telephone Encounter (Signed)
Pt of Dr. SwazilandJordan. She apparently has had some trouble getting in touch with us. Dr. Arlyce DiceKaplan who works w/ patients at her assisted living notes he does not manage her care, but called on her behalf at the patient's request.  She notes some problems (unspecified), thinks it may be r/t Eliquis. Dr. Arlyce DiceKaplan advised probably not the case but the patient wanted to hear it from us. Would need to call patient. She did provide her telephone number to return a call.  Will route to [place].

## 2014-11-13 ENCOUNTER — Encounter: Payer: Self-pay | Admitting: Cardiology

## 2014-11-13 ENCOUNTER — Ambulatory Visit (INDEPENDENT_AMBULATORY_CARE_PROVIDER_SITE_OTHER): Payer: Medicare Other | Admitting: Cardiology

## 2014-11-13 VITALS — BP 164/76 | HR 77 | Ht 64.0 in | Wt 181.1 lb

## 2014-11-13 DIAGNOSIS — I350 Nonrheumatic aortic (valve) stenosis: Secondary | ICD-10-CM | POA: Diagnosis not present

## 2014-11-13 DIAGNOSIS — I482 Chronic atrial fibrillation, unspecified: Secondary | ICD-10-CM

## 2014-11-13 DIAGNOSIS — I25119 Atherosclerotic heart disease of native coronary artery with unspecified angina pectoris: Secondary | ICD-10-CM

## 2014-11-13 DIAGNOSIS — I2510 Atherosclerotic heart disease of native coronary artery without angina pectoris: Secondary | ICD-10-CM | POA: Diagnosis not present

## 2014-11-13 DIAGNOSIS — I5032 Chronic diastolic (congestive) heart failure: Secondary | ICD-10-CM

## 2014-11-13 NOTE — Progress Notes (Signed)
Jacqueline [name] Date of Birth: 1916/08/05 Medical Record #[id number]  History of Present Illness: Jacqueline [name] is seen for follow up CAD and atrial fibrillation.. She has known CAD with past DES to the LAD in February 2014 with residual disease in small diagnonals and OM - treated medically, HTN, HLD, hypothyroidism and mild AS.  When seen in March she was noted to be in atrial fibrillation with controlled rate. She was asymptomatic. After review with the pharmacist we started her on Eliquis at 2.5 mg bid due to age and renal clearance. She initially had some stomach upset with this but has since adjusted to it. She feels well. Still getting PT. She has lost 6 lbs.     Current Outpatient Prescriptions  Medication Sig Dispense Refill  . acetaminophen (TYLENOL) 325 MG tablet Take 650 mg by mouth every 6 (six) hours as needed for pain.    Marland Kitchen apixaban (ELIQUIS) 2.5 MG TABS tablet Take 1 tablet (2.5 mg total) by mouth 2 (two) times daily. 180 tablet 1  . aspirin EC 81 MG tablet Take 81 mg by mouth daily.    Marland Kitchen atorvastatin (LIPITOR) 20 MG tablet Take 20 mg by mouth daily at 6 PM.    . BENICAR 40 MG tablet TAKE 1 TABLET DAILY 90 tablet 1  . carvedilol (COREG) 25 MG tablet TAKE 1 TABLET TWICE A DAY WITH MEALS 180 tablet 2  . furosemide (LASIX) 40 MG tablet Take 1 tablet (40 mg total) by mouth daily. 90 tablet 3  . levothyroxine (SYNTHROID, LEVOTHROID) 112 MCG tablet Take 112 mcg by mouth daily before breakfast.    . nitroGLYCERIN (NITROSTAT) 0.4 MG SL tablet Place 0.4 mg under the tongue every 5 (five) minutes as needed for chest pain.    . potassium chloride (K-DUR,KLOR-CON) 10 MEQ tablet Take 10 mEq by mouth daily.     No current facility-administered medications for this visit.    No Known Allergies  Past Medical History  Diagnosis Date  . Hypertension   . Mild aortic stenosis     a. 01/2010 Echo: Nl EF, mild AS/AI, mild LAE, mild PAH.  . Diabetes mellitus   . Gout   . Hypothyroidism   .  CAD (coronary artery disease)     a. 2-08/2012: Cath/PCI: LM 10-20d, LAD 60-70p, 48m (Rotablator->2.5x32 Promus Premier DES), D1 70-80ost, D2- small-90 ost/95p, LCX large, OM1 40p, OM2 small 80-90, RCA dominant 13m, Ef 65-70%.  . Bilateral leg weakness     chronic  . Hyperlipidemia   . CHF (congestive heart failure)   . Shortness of breath     with chf & lying flat  . Atrial fibrillation     Past Surgical History  Procedure Laterality Date  . Left knee replacement    . Breast biopsies    . Cataract extraction    . Coronary angioplasty with stent placement  march 2014  . Left heart catheterization with coronary angiogram N/A 08/02/2012    Procedure: LEFT HEART CATHETERIZATION WITH CORONARY ANGIOGRAM;  Surgeon: Lanai Conlee M [place], MD;  Location: [facility] CATH LAB;  Service: Cardiovascular;  Laterality: N/A;  . Percutaneous coronary rotoblator intervention (pci-r) N/A 08/05/2012    Procedure: PERCUTANEOUS CORONARY ROTOBLATOR INTERVENTION (PCI-R);  Surgeon: Ninoshka Wainwright M [place], MD;  Location: [facility] CATH LAB;  Service: Cardiovascular;  Laterality: N/A;    History  Smoking status  . Never Smoker   Smokeless tobacco  . Never Used    History  Alcohol Use No  Family History  Problem Relation Age of Onset  . Alzheimer's disease Brother   . Heart disease Father 55    Review of Systems: The review of systems is per the HPI.  All other systems were reviewed and are negative.  Physical Exam: BP 164/76 mmHg  Pulse 77  Ht 5\' 4"  (1.626 m)  Wt 82.146 kg (181 lb 1.6 oz)  BMI 31.07 kg/m2 Patient is very pleasant and in no acute distress. Skin is warm and dry. Color is normal.  HEENT is unremarkable. Normocephalic/atraumatic. PERRL. Sclera are nonicteric. Neck is supple. No masses. No JVD. Lungs are clear. Cardiac exam shows an irregular rate and rhythm. 2/6 outflow murmur noted. Abdomen is soft. Extremities are with 1+ edema with support hose in place. Gait and ROM are intact. She is using a walker. No  gross neurologic deficits noted.  Wt Readings from Last 3 Encounters:  11/13/14 82.146 kg (181 lb 1.6 oz)  08/13/14 85.004 kg (187 lb 6.4 oz)  02/12/14 85.911 kg (189 lb 6.4 oz)     LABORATORY DATA: PENDING  Lab Results  Component Value Date   WBC 5.8 08/13/2014   HGB 14.2 08/13/2014   HCT 43.2 08/13/2014   PLT 142* 08/13/2014   GLUCOSE 104* 08/13/2014   ALT 81* 12/29/2011   AST 155* 12/29/2011   NA 143 08/13/2014   K 4.2 08/13/2014   CL 106 08/13/2014   CREATININE 1.24* 08/13/2014   BUN 23 08/13/2014   CO2 26 08/13/2014   TSH 0.794 08/13/2014   INR 1.03 08/02/2012   HGBA1C 5.9* 07/31/2012     Assessment / Plan: 1. CAD s/p DES of the LAD February 2014. Clinically doing well. Continue  medical Rx. No ASA since she is on Eliquis.  2. Chronic Diastolic HF well compensated. Weight is down 6 lbs. Continue current diuretic dose.  3. Atrial fibrillation. Newly diagnosed. Rate is well controlled and she is asymptomatic. She has a [place] score of 5 and is at high risk of stroke. Continue Eliquis.. I would favor continued rate control strategy since she is largely asymptomatic.   4. HTN -  Continue current antihypertensive therapy.  5. Chronic edema. Continue current diuretic dose and support hose.  6. Mild aortic stenosis.  Patient reports lab work is followed by primary care.  I will see in 6 months.

## 2014-11-13 NOTE — Patient Instructions (Signed)
Continue your current therapy  I will see you in 6 months.   

## 2014-12-18 ENCOUNTER — Encounter: Payer: Self-pay | Admitting: Podiatry

## 2014-12-18 ENCOUNTER — Ambulatory Visit (INDEPENDENT_AMBULATORY_CARE_PROVIDER_SITE_OTHER): Payer: Medicare Other | Admitting: Podiatry

## 2014-12-18 VITALS — BP 99/54 | HR 67 | Resp 16

## 2014-12-18 DIAGNOSIS — M79675 Pain in left toe(s): Secondary | ICD-10-CM

## 2014-12-18 DIAGNOSIS — M79676 Pain in unspecified toe(s): Secondary | ICD-10-CM | POA: Diagnosis not present

## 2014-12-18 DIAGNOSIS — M79674 Pain in right toe(s): Secondary | ICD-10-CM

## 2014-12-18 DIAGNOSIS — B351 Tinea unguium: Secondary | ICD-10-CM | POA: Diagnosis not present

## 2014-12-19 NOTE — Progress Notes (Signed)
Subjective:     Patient ID: Jacqueline [name], female   DOB: 11/03/1916, 81 y.o.   MRN: [id number]  HPIThis patient returns to the office for continue care for her painful nails and corns.  Her nailds have frown long and thick since her last visit.  She also has painful corns on the tips of her toes right foot which are painful when walking.  She presents for preventive foot care services.   Review of Systems     Objective:   Physical Exam Objective: Review of past medical history, medications, social history and allergies were performed.  Vascular: Dorsalis pedis and posterior tibial pulses were not palpable B/L, capillary refill was  WNL B/L, temperature gradient was WNL B/L   Skin: distal clavi 2,4 right foot  Nails: Painful thick mycotic nails both feet  Sensory: Semmes Weinstein monifilament WNL   Orthopedic: Orthopedic evaluation demonstrates all joints distal t ankle have full ROM without crepitus, muscle power WNL B/L     Assessment:     Onychomycosis  Distal clavi right foot     Plan:     Debridement and Grinding of Mycotic Nails   Debridement of distal clavi   RTC 9 weeks

## 2015-01-21 ENCOUNTER — Other Ambulatory Visit: Payer: Self-pay | Admitting: Cardiology

## 2015-02-17 ENCOUNTER — Encounter: Payer: Self-pay | Admitting: Podiatry

## 2015-02-17 ENCOUNTER — Ambulatory Visit (INDEPENDENT_AMBULATORY_CARE_PROVIDER_SITE_OTHER): Payer: Medicare Other | Admitting: Podiatry

## 2015-02-17 DIAGNOSIS — M79675 Pain in left toe(s): Secondary | ICD-10-CM | POA: Diagnosis not present

## 2015-02-17 DIAGNOSIS — B351 Tinea unguium: Secondary | ICD-10-CM

## 2015-02-17 DIAGNOSIS — L84 Corns and callosities: Secondary | ICD-10-CM

## 2015-02-17 DIAGNOSIS — M79674 Pain in right toe(s): Secondary | ICD-10-CM

## 2015-02-17 DIAGNOSIS — M79676 Pain in unspecified toe(s): Secondary | ICD-10-CM

## 2015-02-17 DIAGNOSIS — M2041 Other hammer toe(s) (acquired), right foot: Secondary | ICD-10-CM

## 2015-02-17 NOTE — Progress Notes (Signed)
Subjective:     Patient ID: Jacqueline [name], female   DOB: 06/04/1917, 81 y.o.   MRN: [id number]  HPIThis patient returns to the office for continue care for her painful nails and corns.  Her nailds have frown long and thick since her last visit.  She also has painful corns on the tips of her toes right foot which are painful when walking.  She presents for preventive foot care services.   Review of Systems     Objective:   Physical Exam Objective: Review of past medical history, medications, social history and allergies were performed.  Vascular: Dorsalis pedis and posterior tibial pulses were not palpable B/L, capillary refill was  WNL B/L, temperature gradient was WNL B/L   Skin: distal clavi 2,4 right foot  Nails: Painful thick mycotic nails both feet  Sensory: Semmes Weinstein monifilament WNL   Orthopedic: Orthopedic evaluation demonstrates all joints distal t ankle have full ROM without crepitus, muscle power WNL B/L     Assessment:     Onychomycosis  Distal clavi right foot     Plan:     Debridement and Grinding of Mycotic Nails   Debridement of distal clavi   RTC 9 weeks       

## 2015-02-19 ENCOUNTER — Ambulatory Visit: Payer: Medicare Other | Admitting: Podiatry

## 2015-04-16 ENCOUNTER — Emergency Department (HOSPITAL_COMMUNITY): Payer: Medicare Other

## 2015-04-16 ENCOUNTER — Inpatient Hospital Stay (HOSPITAL_COMMUNITY)
Admission: EM | Admit: 2015-04-16 | Discharge: 2015-04-21 | DRG: 292 | Disposition: A | Discharge status: Skilled Nursing Facility | Payer: Medicare Other | Attending: Internal Medicine | Admitting: Internal Medicine

## 2015-04-16 ENCOUNTER — Encounter (HOSPITAL_COMMUNITY): Payer: Self-pay | Admitting: Internal Medicine

## 2015-04-16 DIAGNOSIS — Z9181 History of falling: Secondary | ICD-10-CM

## 2015-04-16 DIAGNOSIS — E039 Hypothyroidism, unspecified: Secondary | ICD-10-CM | POA: Diagnosis present

## 2015-04-16 DIAGNOSIS — J9601 Acute respiratory failure with hypoxia: Secondary | ICD-10-CM

## 2015-04-16 DIAGNOSIS — I272 Other secondary pulmonary hypertension: Secondary | ICD-10-CM | POA: Diagnosis present

## 2015-04-16 DIAGNOSIS — J918 Pleural effusion in other conditions classified elsewhere: Secondary | ICD-10-CM | POA: Diagnosis not present

## 2015-04-16 DIAGNOSIS — I110 Hypertensive heart disease with heart failure: Secondary | ICD-10-CM | POA: Diagnosis not present

## 2015-04-16 DIAGNOSIS — I2510 Atherosclerotic heart disease of native coronary artery without angina pectoris: Secondary | ICD-10-CM | POA: Diagnosis not present

## 2015-04-16 DIAGNOSIS — I350 Nonrheumatic aortic (valve) stenosis: Secondary | ICD-10-CM | POA: Diagnosis present

## 2015-04-16 DIAGNOSIS — E042 Nontoxic multinodular goiter: Secondary | ICD-10-CM

## 2015-04-16 DIAGNOSIS — I5033 Acute on chronic diastolic (congestive) heart failure: Secondary | ICD-10-CM | POA: Diagnosis not present

## 2015-04-16 DIAGNOSIS — W010XXA Fall on same level from slipping, tripping and stumbling without subsequent striking against object, initial encounter: Secondary | ICD-10-CM | POA: Diagnosis not present

## 2015-04-16 DIAGNOSIS — Z66 Do not resuscitate: Secondary | ICD-10-CM | POA: Diagnosis not present

## 2015-04-16 DIAGNOSIS — E876 Hypokalemia: Secondary | ICD-10-CM | POA: Diagnosis not present

## 2015-04-16 DIAGNOSIS — E785 Hyperlipidemia, unspecified: Secondary | ICD-10-CM | POA: Diagnosis not present

## 2015-04-16 DIAGNOSIS — I509 Heart failure, unspecified: Secondary | ICD-10-CM | POA: Diagnosis not present

## 2015-04-16 DIAGNOSIS — Z7901 Long term (current) use of anticoagulants: Secondary | ICD-10-CM | POA: Diagnosis not present

## 2015-04-16 DIAGNOSIS — Z8249 Family history of ischemic heart disease and other diseases of the circulatory system: Secondary | ICD-10-CM

## 2015-04-16 DIAGNOSIS — J90 Pleural effusion, not elsewhere classified: Secondary | ICD-10-CM | POA: Diagnosis present

## 2015-04-16 DIAGNOSIS — I482 Chronic atrial fibrillation, unspecified: Secondary | ICD-10-CM | POA: Diagnosis present

## 2015-04-16 DIAGNOSIS — Z96652 Presence of left artificial knee joint: Secondary | ICD-10-CM | POA: Diagnosis not present

## 2015-04-16 DIAGNOSIS — I447 Left bundle-branch block, unspecified: Secondary | ICD-10-CM | POA: Diagnosis present

## 2015-04-16 DIAGNOSIS — D696 Thrombocytopenia, unspecified: Secondary | ICD-10-CM | POA: Diagnosis not present

## 2015-04-16 DIAGNOSIS — Y92129 Unspecified place in nursing home as the place of occurrence of the external cause: Secondary | ICD-10-CM | POA: Diagnosis not present

## 2015-04-16 DIAGNOSIS — E119 Type 2 diabetes mellitus without complications: Secondary | ICD-10-CM | POA: Diagnosis not present

## 2015-04-16 DIAGNOSIS — W19XXXA Unspecified fall, initial encounter: Secondary | ICD-10-CM | POA: Diagnosis present

## 2015-04-16 DIAGNOSIS — I10 Essential (primary) hypertension: Secondary | ICD-10-CM | POA: Diagnosis present

## 2015-04-16 DIAGNOSIS — Z955 Presence of coronary angioplasty implant and graft: Secondary | ICD-10-CM | POA: Diagnosis not present

## 2015-04-16 DIAGNOSIS — M109 Gout, unspecified: Secondary | ICD-10-CM | POA: Diagnosis present

## 2015-04-16 DIAGNOSIS — Z9889 Other specified postprocedural states: Secondary | ICD-10-CM

## 2015-04-16 DIAGNOSIS — R0902 Hypoxemia: Secondary | ICD-10-CM | POA: Diagnosis not present

## 2015-04-16 DIAGNOSIS — S0101XA Laceration without foreign body of scalp, initial encounter: Secondary | ICD-10-CM | POA: Diagnosis present

## 2015-04-16 LAB — CBC WITH DIFFERENTIAL/PLATELET
Basophils Absolute: 0 10*3/uL (ref 0.0–0.1)
Basophils Relative: 0 %
EOS ABS: 0.1 10*3/uL (ref 0.0–0.7)
EOS PCT: 1 %
HCT: 43.3 % (ref 36.0–46.0)
Hemoglobin: 14.4 g/dL (ref 12.0–15.0)
Lymphocytes Relative: 8 %
Lymphs Abs: 0.9 10*3/uL (ref 0.7–4.0)
MCH: 29.9 pg (ref 26.0–34.0)
MCHC: 33.3 g/dL (ref 30.0–36.0)
MCV: 90 fL (ref 78.0–100.0)
MONOS PCT: 6 %
Monocytes Absolute: 0.6 10*3/uL (ref 0.1–1.0)
Neutro Abs: 9 10*3/uL — ABNORMAL HIGH (ref 1.7–7.7)
Neutrophils Relative %: 85 %
PLATELETS: 127 10*3/uL — AB (ref 150–400)
RBC: 4.81 MIL/uL (ref 3.87–5.11)
RDW: 14.1 % (ref 11.5–15.5)
WBC: 10.7 10*3/uL — ABNORMAL HIGH (ref 4.0–10.5)

## 2015-04-16 LAB — COMPREHENSIVE METABOLIC PANEL
ALBUMIN: 3.9 g/dL (ref 3.5–5.0)
ALT: 20 U/L (ref 14–54)
ANION GAP: 10 (ref 5–15)
AST: 25 U/L (ref 15–41)
Alkaline Phosphatase: 108 U/L (ref 38–126)
BUN: 22 mg/dL — ABNORMAL HIGH (ref 6–20)
CALCIUM: 9.3 mg/dL (ref 8.9–10.3)
CO2: 24 mmol/L (ref 22–32)
CREATININE: 1.08 mg/dL — AB (ref 0.44–1.00)
Chloride: 106 mmol/L (ref 101–111)
GFR calc Af Amer: 49 mL/min — ABNORMAL LOW (ref >60)
GFR calc non Af Amer: 42 mL/min — ABNORMAL LOW (ref >60)
GLUCOSE: 119 mg/dL — AB (ref 65–99)
Potassium: 3.8 mmol/L (ref 3.5–5.1)
SODIUM: 140 mmol/L (ref 135–145)
TOTAL PROTEIN: 6.5 g/dL (ref 6.5–8.1)
Total Bilirubin: 1.3 mg/dL — ABNORMAL HIGH (ref 0.3–1.2)

## 2015-04-16 LAB — URINALYSIS, ROUTINE W REFLEX MICROSCOPIC
Bilirubin Urine: NEGATIVE
GLUCOSE, UA: NEGATIVE mg/dL
Ketones, ur: 15 mg/dL — AB
Nitrite: NEGATIVE
PH: 6.5 (ref 5.0–8.0)
Protein, ur: NEGATIVE mg/dL
SPECIFIC GRAVITY, URINE: 1.016 (ref 1.005–1.030)
Urobilinogen, UA: 0.2 mg/dL (ref 0.0–1.0)

## 2015-04-16 LAB — URINE MICROSCOPIC-ADD ON

## 2015-04-16 LAB — I-STAT TROPONIN, ED: Troponin i, poc: 0.01 ng/mL (ref 0.00–0.08)

## 2015-04-16 LAB — PROTIME-INR
INR: 1.37 (ref 0.00–1.49)
PROTHROMBIN TIME: 17 s — AB (ref 11.6–15.2)

## 2015-04-16 LAB — BRAIN NATRIURETIC PEPTIDE: B Natriuretic Peptide: 278.6 pg/mL — ABNORMAL HIGH (ref 0.0–100.0)

## 2015-04-16 MED ORDER — FUROSEMIDE 10 MG/ML IJ SOLN
40.0000 mg | Freq: Once | INTRAMUSCULAR | Status: AC
Start: 2015-04-16 — End: 2015-04-16
  Administered 2015-04-16: 40 mg via INTRAVENOUS
  Filled 2015-04-16: qty 4

## 2015-04-16 MED ORDER — ACETAMINOPHEN 325 MG PO TABS
325.0000 mg | ORAL_TABLET | Freq: Once | ORAL | Status: AC
  Administered 2015-04-16: 325 mg via ORAL
  Filled 2015-04-16: qty 1

## 2015-04-16 MED ORDER — TETANUS-DIPHTH-ACELL PERTUSSIS 5-2.5-18.5 LF-MCG/0.5 IM SUSP
0.5000 mL | Freq: Once | INTRAMUSCULAR | Status: AC
  Administered 2015-04-16: 0.5 mL via INTRAMUSCULAR
  Filled 2015-04-16: qty 0.5

## 2015-04-16 MED ORDER — LIDOCAINE HCL (PF) 1 % IJ SOLN
10.0000 mL | Freq: Once | INTRAMUSCULAR | Status: DC
  Filled 2015-04-16: qty 10

## 2015-04-16 NOTE — ED Notes (Signed)
[facility].  Patient states that she tripped over her feet and fell backwards. Denies dizziness.  Patient A & O X 4.  No LOC.

## 2015-04-16 NOTE — H&P (Signed)
PCP:  Pamelia HoitWILSON,FRED HENRY, MD  Cardiology [place]  Referring provider Liu   Chief Complaint:  Fall, hypoxia  HPI: Jacqueline [name] is a 81 y.o. female   has a past medical history of Hypertension; Mild aortic stenosis; Diabetes mellitus; Gout; Hypothyroidism; CAD (coronary artery disease); Bilateral leg weakness; Hyperlipidemia; CHF (congestive heart failure) (HCC); Shortness of breath; and Atrial fibrillation (HCC).   Presented with  Patient with known history of atrial fibrillation on anticoagulation is also a history of coronary artery disease lives [facility] patient have fallen today due to mechanical cause. She denied any loss of consciousness. Workup showed pleural effusion could not rule out infiltrate. Patient had not had any coughing she has had no fevers. patient does endorse some head injury CT scan of the head did not show any evidence of intracranial bleeding. The plantar region for patient to be discharged back to facility but on ambulation she was noted to be hypoxic down to mid 80s. Chest x-ray was done that showed right lower lobe process combination of effusion and atelectasis versus infiltrate. Of note per review of imaging she had had a chronic right pleural effusion that has progressed. HSe reports feeling short of breath for some time but can not tell for how long. Denies any fever, no burning with urination.  In ER she was given Lasix 40 MG with good diuresis, Patient denies any recent cough. Denies worsening leg swelling.    Hospitalist was called for admission for persistent hypoxia and chest x-ray significant for pleural effusion  Review of Systems:    Pertinent positives include: Shortness of breath, loss of appetite  Constitutional:  No weight loss, night sweats, Fevers, chills, fatigue, weight loss  HEENT:  No headaches, Difficulty swallowing,Tooth/dental problems,Sore throat,  No sneezing, itching, ear ache, nasal congestion, post nasal  drip,  Cardio-vascular:  No chest pain, Orthopnea, PND, anasarca, dizziness, palpitations.no Bilateral lower extremity swelling  GI:  No heartburn, indigestion, abdominal pain, nausea, vomiting, diarrhea, change in bowel habits, loss of appetite, melena, blood in stool, hematemesis Resp:  no shortness of breath at rest. No dyspnea on exertion, No excess mucus, no productive cough, No non-productive cough, No coughing up of blood.No change in color of mucus.No wheezing. Skin:  no rash or lesions. No jaundice GU:  no dysuria, change in color of urine, no urgency or frequency. No straining to urinate.  No flank pain.  Musculoskeletal:  No joint pain or no joint swelling. No decreased range of motion. No back pain.  Psych:  No change in mood or affect. No depression or anxiety. No memory loss.  Neuro: no localizing neurological complaints, no tingling, no weakness, no double vision, no gait abnormality, no slurred speech, no confusion  Otherwise ROS are negative except for above, 10 systems were reviewed  Past Medical History: Past Medical History  Diagnosis Date  . Hypertension   . Mild aortic stenosis     a. 01/2010 Echo: Nl EF, mild AS/AI, mild LAE, mild PAH.  . Diabetes mellitus   . Gout   . Hypothyroidism   . CAD (coronary artery disease)     a. 2-08/2012: Cath/PCI: LM 10-20d, LAD 60-70p, 535m (Rotablator->2.5x32 Promus Premier DES), D1 70-80ost, D2- small-90 ost/95p, LCX large, OM1 40p, OM2 small 80-90, RCA dominant 4857m, Ef 65-70%.  . Bilateral leg weakness     chronic  . Hyperlipidemia   . CHF (congestive heart failure) (HCC)   . Shortness of breath     with  chf & lying flat  . Atrial fibrillation [facility])    Past Surgical History  Procedure Laterality Date  . Left knee replacement    . Breast biopsies    . Cataract extraction    . Coronary angioplasty with stent placement  march 2014  . Left heart catheterization with coronary angiogram N/A 08/02/2012    Procedure: LEFT  HEART CATHETERIZATION WITH CORONARY ANGIOGRAM;  Surgeon: Peter M Swaziland, MD;  Location: [facility] CATH LAB;  Service: Cardiovascular;  Laterality: N/A;  . Percutaneous coronary rotoblator intervention (pci-r) N/A 08/05/2012    Procedure: PERCUTANEOUS CORONARY ROTOBLATOR INTERVENTION (PCI-R);  Surgeon: Peter M Swaziland, MD;  Location: [facility] CATH LAB;  Service: Cardiovascular;  Laterality: N/A;     Medications: Prior to Admission medications   Medication Sig Start Date End Date Taking? Authorizing Provider  acetaminophen (TYLENOL) 325 MG tablet Take 650 mg by mouth every 6 (six) hours as needed for pain.   Yes Historical Provider, MD  atorvastatin (LIPITOR) 20 MG tablet Take 20 mg by mouth daily at 6 PM.   Yes Historical Provider, MD  carvedilol (COREG) 25 MG tablet TAKE 1 TABLET TWICE A DAY WITH MEALS   Yes Peter M [place], MD  ELIQUIS 2.5 MG TABS tablet TAKE 1 TABLET TWICE A DAY 01/21/15  Yes Peter M [place], MD  furosemide (LASIX) 40 MG tablet Take 1 tablet (40 mg total) by mouth daily. 05/19/13  Yes Belkys A Regalado, MD  levothyroxine (SYNTHROID, LEVOTHROID) 112 MCG tablet Take 112 mcg by mouth daily before breakfast.   Yes Historical Provider, MD  nitroGLYCERIN (NITROSTAT) 0.4 MG SL tablet Place 0.4 mg under the tongue every 5 (five) minutes as needed for chest pain.   Yes Historical Provider, MD  olmesartan (BENICAR) 20 MG tablet Take 20 mg by mouth daily.   Yes Historical Provider, MD  potassium chloride SA (K-DUR,KLOR-CON) 20 MEQ tablet Take 20 mEq by mouth 2 (two) times daily.   Yes Historical Provider, MD    Allergies:   Allergies  Allergen Reactions  . Aspirin     Pt states she cannot have aspirin due to Eliquis    Social History:  Ambulatory  [place]   From facility [facility]   reports that she has never smoked. She has never used smokeless tobacco. She reports that she does not drink alcohol or use illicit drugs.    Family History: family history includes  Alzheimer's disease in her brother; Heart disease (age of onset: 55) in her father.    Physical Exam: Patient Vitals for the past 24 hrs:  BP Temp Temp src Pulse Resp SpO2  04/16/15 2208 151/78 mmHg - - 85 26 97 %  04/16/15 2015 - - - 93 21 95 %  04/16/15 1945 - - - 81 - 95 %  04/16/15 1930 - - - 88 - 95 %  04/16/15 1915 155/72 mmHg - - 104 22 93 %  04/16/15 1652 197/85 mmHg - - 92 23 95 %  04/16/15 1512 193/91 mmHg 97.9 F (36.6 C) Oral 79 20 94 %    1. General:  in No Acute distress 2. Psychological: Alert and  Oriented 3. Head/ENT:    Dry Mucous Membranes                          Head Non traumatic, neck supple  Normal  Dentition 4. SKIN: normal  Skin turgor,  Skin clean Dry and intact no rash 5. Heart: Regular rate and rhythm no Murmur, Rub or gallop 6. Lungs:   no wheezes occasional crackles  diminished breath sounds in the right base 7. Abdomen: Soft, non-tender, Non distended 8. Lower extremities: no clubbing, cyanosis, some edema 9. Neurologically Grossly intact, moving all 4 extremities equally 10. MSK: Normal range of motion  body mass index is unknown because there is no weight on file.   Labs on Admission:   Results for orders placed or performed during the hospital encounter of 04/16/15 (from the past 24 hour(s))  CBC with Differential     Status: Abnormal   Collection Time: 04/16/15  6:02 PM  Result Value Ref Range   WBC 10.7 (H) 4.0 - 10.5 K/uL   RBC 4.81 3.87 - 5.11 MIL/uL   Hemoglobin 14.4 12.0 - 15.0 g/dL   HCT 16.1 09.6 - 04.5 %   MCV 90.0 78.0 - 100.0 fL   MCH 29.9 26.0 - 34.0 pg   MCHC 33.3 30.0 - 36.0 g/dL   RDW 40.9 81.1 - 91.4 %   Platelets 127 (L) 150 - 400 K/uL   Neutrophils Relative % 85 %   Neutro Abs 9.0 (H) 1.7 - 7.7 K/uL   Lymphocytes Relative 8 %   Lymphs Abs 0.9 0.7 - 4.0 K/uL   Monocytes Relative 6 %   Monocytes Absolute 0.6 0.1 - 1.0 K/uL   Eosinophils Relative 1 %   Eosinophils Absolute 0.1 0.0 - 0.7  K/uL   Basophils Relative 0 %   Basophils Absolute 0.0 0.0 - 0.1 K/uL  Comprehensive metabolic panel     Status: Abnormal   Collection Time: 04/16/15  6:02 PM  Result Value Ref Range   Sodium 140 135 - 145 mmol/L   Potassium 3.8 3.5 - 5.1 mmol/L   Chloride 106 101 - 111 mmol/L   CO2 24 22 - 32 mmol/L   Glucose, Bld 119 (H) 65 - 99 mg/dL   BUN 22 (H) 6 - 20 mg/dL   Creatinine, Ser 7.82 (H) 0.44 - 1.00 mg/dL   Calcium 9.3 8.9 - 95.6 mg/dL   Total Protein 6.5 6.5 - 8.1 g/dL   Albumin 3.9 3.5 - 5.0 g/dL   AST 25 15 - 41 U/L   ALT 20 14 - 54 U/L   Alkaline Phosphatase 108 38 - 126 U/L   Total Bilirubin 1.3 (H) 0.3 - 1.2 mg/dL   GFR calc non Af Amer 42 (L) >60 mL/min   GFR calc Af Amer 49 (L) >60 mL/min   Anion gap 10 5 - 15  Protime-INR     Status: Abnormal   Collection Time: 04/16/15  6:02 PM  Result Value Ref Range   Prothrombin Time 17.0 (H) 11.6 - 15.2 seconds   INR 1.37 0.00 - 1.49  Brain natriuretic peptide     Status: Abnormal   Collection Time: 04/16/15  6:21 PM  Result Value Ref Range   B Natriuretic Peptide 278.6 (H) 0.0 - 100.0 pg/mL  I-Stat Troponin, ED (not at [facility])     Status: None   Collection Time: 04/16/15  6:26 PM  Result Value Ref Range   Troponin i, poc 0.01 0.00 - 0.08 ng/mL   Comment 3          Urinalysis, Routine w reflex microscopic (not at [facility])     Status: Abnormal   Collection Time: 04/16/15  7:39  PM  Result Value Ref Range   Color, Urine YELLOW YELLOW   APPearance CLEAR CLEAR   Specific Gravity, Urine 1.016 1.005 - 1.030   pH 6.5 5.0 - 8.0   Glucose, UA NEGATIVE NEGATIVE mg/dL   Hgb urine dipstick MODERATE (A) NEGATIVE   Bilirubin Urine NEGATIVE NEGATIVE   Ketones, ur 15 (A) NEGATIVE mg/dL   Protein, ur NEGATIVE NEGATIVE mg/dL   Urobilinogen, UA 0.2 0.0 - 1.0 mg/dL   Nitrite NEGATIVE NEGATIVE   Leukocytes, UA SMALL (A) NEGATIVE  Urine microscopic-add on     Status: Abnormal   Collection Time: 04/16/15  7:39 PM  Result Value Ref Range    Squamous Epithelial / LPF FEW (A) RARE   WBC, UA 11-20 <3 WBC/hpf   RBC / HPF 3-6 <3 RBC/hpf   Bacteria, UA RARE RARE    UA 11-20 white blood cells but rare bacteria and negative nitrites  Lab Results  Component Value Date   HGBA1C 5.9* 07/31/2012    CrCl cannot be calculated (Unknown ideal weight.).  BNP (last 3 results) No results for input(s): PROBNP in the last 8760 hours.  Other results:  I have pearsonaly reviewed this: ECG REPORT  Rate:107  Rhythm: Left bundle-branch block ST&T Change: n/a   There were no vitals filed for this visit.   Cultures:    Component Value Date/Time   SDES BLOOD LEFT ARM 05/17/2013 1000   SPECREQUEST BOTTLES DRAWN AEROBIC ONLY 10CC 05/17/2013 1000   CULT  05/17/2013 1000    NO GROWTH 5 DAYS Performed at [facility]   REPTSTATUS 05/23/2013 FINAL 05/17/2013 1000     Radiological Exams on Admission: Dg Chest 2 View  04/16/2015  CLINICAL DATA:  Larey Seat today at nursing home.  Chest pain. EXAM: CHEST  2 VIEW COMPARISON:  05/16/2013 FINDINGS: The heart is enlarged but stable. There is tortuosity and calcification of the thoracic aorta. Right lower lobe process is a combination of elevated right hemidiaphragm, pleural effusion and atelectasis or infiltrate. Mild vascular congestion and slightly thickened interstitial lines suggesting mild interstitial edema. The bony thorax is grossly intact. Stable exaggerated thoracic kyphosis but no acute fracture. IMPRESSION: Stable cardiac enlargement and chronic lung changes. Right lower lobe process likely a combination of effusion and atelectasis or infiltrate. Vascular congestion and probable mild interstitial edema. Electronically Signed   By: Rudie Meyer M.D.   On: 04/16/2015 18:11   Ct Head Wo Contrast  04/16/2015  CLINICAL DATA:  Patient to ED per EMS From [facility] post fall. Patient states that she tripped over her feet and fell backwards. Denies dizziness. Lac to back of head.  EXAM: CT HEAD WITHOUT CONTRAST CT CERVICAL SPINE WITHOUT CONTRAST TECHNIQUE: Multidetector CT imaging of the head and cervical spine was performed following the standard protocol without intravenous contrast. Multiplanar CT image reconstructions of the cervical spine were also generated. COMPARISON:  03/30/2010 FINDINGS: CT HEAD FINDINGS There is central and cortical atrophy. Periventricular white matter changes are consistent with small vessel disease. Old lacunar infarct is identified within the right basal ganglia, stable in appearance. There is no intra or extra-axial fluid collection or mass lesion. The basilar cisterns and ventricles have a normal appearance. There is no CT evidence for acute infarction or hemorrhage. Left posterior parietal scalp edema/hematoma noted. Bone windows show no acute calvarial injury. There is atherosclerotic calcification of the internal carotid arteries. Paranasal and mastoid air cells are normally aerated. CT CERVICAL SPINE FINDINGS Enlarged right thyroid lobe with low-attenuation  lesion and rim calcified lesion appears stable since previous CT of the cervical spine in 2011. There is moderate degenerative change in the midcervical spine, most notable at C5-6 and C6-7. No acute fracture or traumatic subluxation. There is a right pleural effusion and left atelectasis or effusion no warranting further evaluation with chest x-ray. IMPRESSION: 1. Atrophy and small vessel disease. 2.  No evidence for acute intracranial abnormality. 3. Left parietal scalp edema. 4.  No evidence for acute cervical spine abnormality. 5. Stable probable goiter of the thyroid gland. 6. Moderate right pleural effusion suspected. Chest x-ray is recommended. Electronically Signed   By: Norva Pavlov M.D.   On: 04/16/2015 16:40   Ct Cervical Spine Wo Contrast  04/16/2015  CLINICAL DATA:  Patient to ED per EMS From [facility] post fall. Patient states that she tripped over her feet and fell  backwards. Denies dizziness. Lac to back of head. EXAM: CT HEAD WITHOUT CONTRAST CT CERVICAL SPINE WITHOUT CONTRAST TECHNIQUE: Multidetector CT imaging of the head and cervical spine was performed following the standard protocol without intravenous contrast. Multiplanar CT image reconstructions of the cervical spine were also generated. COMPARISON:  03/30/2010 FINDINGS: CT HEAD FINDINGS There is central and cortical atrophy. Periventricular white matter changes are consistent with small vessel disease. Old lacunar infarct is identified within the right basal ganglia, stable in appearance. There is no intra or extra-axial fluid collection or mass lesion. The basilar cisterns and ventricles have a normal appearance. There is no CT evidence for acute infarction or hemorrhage. Left posterior parietal scalp edema/hematoma noted. Bone windows show no acute calvarial injury. There is atherosclerotic calcification of the internal carotid arteries. Paranasal and mastoid air cells are normally aerated. CT CERVICAL SPINE FINDINGS Enlarged right thyroid lobe with low-attenuation lesion and rim calcified lesion appears stable since previous CT of the cervical spine in 2011. There is moderate degenerative change in the midcervical spine, most notable at C5-6 and C6-7. No acute fracture or traumatic subluxation. There is a right pleural effusion and left atelectasis or effusion no warranting further evaluation with chest x-ray. IMPRESSION: 1. Atrophy and small vessel disease. 2.  No evidence for acute intracranial abnormality. 3. Left parietal scalp edema. 4.  No evidence for acute cervical spine abnormality. 5. Stable probable goiter of the thyroid gland. 6. Moderate right pleural effusion suspected. Chest x-ray is recommended. Electronically Signed   By: Norva Pavlov M.D.   On: 04/16/2015 16:40    Chart has been reviewed  Family not at  Bedside    Assessment/Plan  81 yo W new hypoxia due to  pleural effusion due to   diastolic CHF infectious source less likely  Present on Admission:  . Pleural effusion - this apparently has been slowly increasing. We will attempt to first use diuresis blood obtain CT scan of the chest to evaluate for any evidence of infectious source versus malignancy versus localization. If patient continues to have significant shortness of breath hypoxia and persistent pleural effusion would have interventional radiology  evaluate for possible thoracentesis  . Acute diastolic CHF (congestive heart failure) (HCC)- admit on telemetry, cycle cardiac enzymes, obtain serial ECG, to evaluate for ischemia as a cause of heart failure  monitor daily weight  diurese with IV lasix and monitor orthostatics and creatinine to avoid over diuresis.  Order echogram to evaluate EF and valves Make sure patient is on ACE/ARBi   . Fall once patient is stable will need PT OT evaluation for safe discharge planning  .  Hypertension continue home medications  . Atrial fibrillation (HCC) hold anticoagulation K she will require procedures continue rate controlling medications  . Hypoxia in a setting of significant pleural effusion. We'll attempt to diuresis and if not successful may need thoracentesis   Prophylaxis:  SCDs  CODE STATUS:   DNR/DNI as per patient    Disposition:                           Back to current facility when stable                           Other plan as per orders.  I have spent a total of 55 min on this admission  Shaylah Mcghie 04/16/2015, 11:34 PM  [facility]  Pager [phone]   after 2 AM please page floor coverage PA If 7AM-7PM, please contact the day team taking care of the patient  [email or web address]  Password [email or web address]

## 2015-04-16 NOTE — ED Provider Notes (Signed)
Please see previous provider's note regarding patient's presenting history and physical, initial ED course, and associated MDM. In short this is a 81 year old female with history of atrial fibrillation on Eliqu, CHF, and CAD who presents after mechanical fall. Had negative CT head and cervical spine imaging, but also endorses that she has had generalized weakness with intermittent shortness of breath at home. Has a 2 L oxygen requirement here in the emergency department, so additional workup was pursued and signed out to me.   Does endorse not feeling well recently with generalized weakness. Does state that she has bouts of shortness of breath with chest pressure and was not sure why she felt that way. No major orthopnea, PND. With ambulation patient has oxygenation 83-87% on room air. No home oxygen. Looks fluid overloaded on exam. BNP mildly elevated, and CXR with mild interstitial edema and pulmonary vascular congestion. Neg troponin. Given 40 mg of IV lasix and admitted to hospitalist service.  Lavera Guiseana Duo Rozalyn Osland, MD 04/16/15 2207

## 2015-04-16 NOTE — ED Notes (Signed)
Suture cart at bedside 

## 2015-04-16 NOTE — ED Notes (Signed)
Ambulated approximately 4230ft. Pt Sp02 ranging from 83-87 on room air. Pt required standby while ambulating.

## 2015-04-16 NOTE — ED Provider Notes (Signed)
CSN: [id number]     Arrival date & time 04/16/15  1503 History   First MD Initiated Contact with Patient 04/16/15 1506     Chief Complaint  Patient presents with  . Fall     (Consider location/radiation/quality/duration/timing/severity/associated sxs/prior Treatment) HPI Comments: 81 y.o. Female with history of atrial fibrillation on Eliquis, CHF, HTN, CAD presents following a fall.  The patient states that she was turning around today while waiting for a meal when she tripped over her feet and fell backwards hitting her head.  She denies headache, dizziness, chest pain, shortness of breath.  Patient reports she also had a fall yesterday.   Past Medical History  Diagnosis Date  . Hypertension   . Mild aortic stenosis     a. 01/2010 Echo: Nl EF, mild AS/AI, mild LAE, mild PAH.  . Diabetes mellitus   . Gout   . Hypothyroidism   . CAD (coronary artery disease)     a. 2-08/2012: Cath/PCI: LM 10-20d, LAD 60-70p, 4m (Rotablator->2.5x32 Promus Premier DES), D1 70-80ost, D2- small-90 ost/95p, LCX large, OM1 40p, OM2 small 80-90, RCA dominant 37m, Ef 65-70%.  . Bilateral leg weakness     chronic  . Hyperlipidemia   . CHF (congestive heart failure)   . Shortness of breath     with chf & lying flat  . Atrial fibrillation    Past Surgical History  Procedure Laterality Date  . Left knee replacement    . Breast biopsies    . Cataract extraction    . Coronary angioplasty with stent placement  march 2014  . Left heart catheterization with coronary angiogram N/A 08/02/2012    Procedure: LEFT HEART CATHETERIZATION WITH CORONARY ANGIOGRAM;  Surgeon: Peter M Swaziland, MD;  Location: [facility] CATH LAB;  Service: Cardiovascular;  Laterality: N/A;  . Percutaneous coronary rotoblator intervention (pci-r) N/A 08/05/2012    Procedure: PERCUTANEOUS CORONARY ROTOBLATOR INTERVENTION (PCI-R);  Surgeon: Peter M Swaziland, MD;  Location: [facility] CATH LAB;  Service: Cardiovascular;  Laterality: N/A;   Family History  Problem  Relation Age of Onset  . Alzheimer's disease Brother   . Heart disease Father 65   Social History  Substance Use Topics  . Smoking status: Never Smoker   . Smokeless tobacco: Never Used  . Alcohol Use: No   OB History    No data available     Review of Systems  Constitutional: Negative for fever and chills.  HENT: Negative for congestion, postnasal drip, rhinorrhea, sinus pressure and voice change.   Gastrointestinal: Negative for nausea, vomiting and abdominal pain.  Genitourinary: Negative for dysuria, urgency and hematuria.  Musculoskeletal: Negative for myalgias, back pain, arthralgias, neck pain and neck stiffness.  Skin: Negative for rash.  Neurological: Negative for seizures, syncope, weakness and headaches.  Hematological: Bruises/bleeds easily.      Allergies  Aspirin  Home Medications   Prior to Admission medications   Medication Sig Start Date End Date Taking? Authorizing Provider  acetaminophen (TYLENOL) 325 MG tablet Take 650 mg by mouth every 6 (six) hours as needed for pain.   Yes Historical Provider, MD  atorvastatin (LIPITOR) 20 MG tablet Take 20 mg by mouth daily at 6 PM.   Yes Historical Provider, MD  carvedilol (COREG) 25 MG tablet TAKE 1 TABLET TWICE A DAY WITH MEALS   Yes Peter M Swaziland, MD  ELIQUIS 2.5 MG TABS tablet TAKE 1 TABLET TWICE A DAY 01/21/15  Yes Peter M Swaziland, MD  furosemide (LASIX) 40 MG tablet Take  1 tablet (40 mg total) by mouth daily. 05/19/13  Yes Belkys A Regalado, MD  levothyroxine (SYNTHROID, LEVOTHROID) 112 MCG tablet Take 112 mcg by mouth daily before breakfast.   Yes Historical Provider, MD  nitroGLYCERIN (NITROSTAT) 0.4 MG SL tablet Place 0.4 mg under the tongue every 5 (five) minutes as needed for chest pain.   Yes Historical Provider, MD  olmesartan (BENICAR) 20 MG tablet Take 20 mg by mouth daily.   Yes Historical Provider, MD  potassium chloride SA (K-DUR,KLOR-CON) 20 MEQ tablet Take 20 mEq by mouth 2 (two) times daily.   Yes  Historical Provider, MD   BP 197/85 mmHg  Pulse 92  Temp(Src) 97.9 F (36.6 C) (Oral)  Resp 23  SpO2 95% Physical Exam  Constitutional: She is oriented to person, place, and time. She appears well-developed and well-nourished. No distress.  HENT:  Head: Normocephalic and atraumatic.  Right Ear: External ear normal. No hemotympanum.  Left Ear: External ear normal. No hemotympanum.  Nose: Nose normal. No nasal septal hematoma. No epistaxis.  Mouth/Throat: Oropharynx is clear and moist. No oropharyngeal exudate.  Small, anterior abrasion to the left tongue  Eyes: EOM are normal. Pupils are equal, round, and reactive to light.  Neck: Normal range of motion. Neck supple.  Cardiovascular: Normal rate, normal heart sounds and intact distal pulses.  An irregularly irregular rhythm present.  No murmur heard. Pulmonary/Chest: Effort normal. No respiratory distress. She has no wheezes. She has no rales.  Abdominal: Soft. She exhibits no distension. There is no tenderness.  Musculoskeletal: Normal range of motion. She exhibits no edema or tenderness.       Right hip: She exhibits normal range of motion, normal strength, no tenderness and no bony tenderness.       Left hip: She exhibits normal range of motion, normal strength, no tenderness and no bony tenderness.       Right knee: Normal.       Left knee: Normal.       Right ankle: Normal.       Left ankle: Normal.  Pelvis stable  Neurological: She is alert and oriented to person, place, and time.  Skin: Skin is warm and dry. No rash noted. She is not diaphoretic.  Vitals reviewed.   ED Course  .Marland KitchenLaceration Repair Date/Time: 04/16/2015 5:38 PM Performed by: Tyrone Apple ROE Authorized by: Leta Baptist Consent: Verbal consent obtained. Risks and benefits: risks, benefits and alternatives were discussed Consent given by: patient Patient understanding: patient states understanding of the procedure being performed Patient identity  confirmed: verbally with patient and arm band Body area: head/neck Location details: scalp Laceration length: 2 cm Foreign bodies: no foreign bodies Tendon involvement: none Nerve involvement: none Vascular damage: no Anesthesia: local infiltration Local anesthetic: lidocaine 1% without epinephrine Patient sedated: no Irrigation solution: tap water Skin closure: staples Approximation: close Approximation difficulty: simple Comments: 2 staples placed in posterior scalp.   (including critical care time) Labs Review Labs Reviewed  CBC WITH DIFFERENTIAL/PLATELET  COMPREHENSIVE METABOLIC PANEL  URINALYSIS, ROUTINE W REFLEX MICROSCOPIC (NOT AT [facility])  PROTIME-INR  BRAIN NATRIURETIC PEPTIDE  I-STAT TROPOININ, ED    Imaging Review Ct Head Wo Contrast  04/16/2015  CLINICAL DATA:  Patient to ED per EMS From [facility] post fall. Patient states that she tripped over her feet and fell backwards. Denies dizziness. Lac to back of head. EXAM: CT HEAD WITHOUT CONTRAST CT CERVICAL SPINE WITHOUT CONTRAST TECHNIQUE: Multidetector CT imaging of the head and cervical  spine was performed following the standard protocol without intravenous contrast. Multiplanar CT image reconstructions of the cervical spine were also generated. COMPARISON:  03/30/2010 FINDINGS: CT HEAD FINDINGS There is central and cortical atrophy. Periventricular white matter changes are consistent with small vessel disease. Old lacunar infarct is identified within the right basal ganglia, stable in appearance. There is no intra or extra-axial fluid collection or mass lesion. The basilar cisterns and ventricles have a normal appearance. There is no CT evidence for acute infarction or hemorrhage. Left posterior parietal scalp edema/hematoma noted. Bone windows show no acute calvarial injury. There is atherosclerotic calcification of the internal carotid arteries. Paranasal and mastoid air cells are normally aerated. CT CERVICAL SPINE  FINDINGS Enlarged right thyroid lobe with low-attenuation lesion and rim calcified lesion appears stable since previous CT of the cervical spine in 2011. There is moderate degenerative change in the midcervical spine, most notable at C5-6 and C6-7. No acute fracture or traumatic subluxation. There is a right pleural effusion and left atelectasis or effusion no warranting further evaluation with chest x-ray. IMPRESSION: 1. Atrophy and small vessel disease. 2.  No evidence for acute intracranial abnormality. 3. Left parietal scalp edema. 4.  No evidence for acute cervical spine abnormality. 5. Stable probable goiter of the thyroid gland. 6. Moderate right pleural effusion suspected. Chest x-ray is recommended. Electronically Signed   By: Norva PavlovElizabeth  Brown M.D.   On: 04/16/2015 16:40   Ct Cervical Spine Wo Contrast  04/16/2015  CLINICAL DATA:  [facility] Patient states that she tripped over her feet and fell backwards. Denies dizziness. Lac to back of head. EXAM: CT HEAD WITHOUT CONTRAST CT CERVICAL SPINE WITHOUT CONTRAST TECHNIQUE: Multidetector CT imaging of the head and cervical spine was performed following the standard protocol without intravenous contrast. Multiplanar CT image reconstructions of the cervical spine were also generated. COMPARISON:  03/30/2010 FINDINGS: CT HEAD FINDINGS There is central and cortical atrophy. Periventricular white matter changes are consistent with small vessel disease. Old lacunar infarct is identified within the right basal ganglia, stable in appearance. There is no intra or extra-axial fluid collection or mass lesion. The basilar cisterns and ventricles have a normal appearance. There is no CT evidence for acute infarction or hemorrhage. Left posterior parietal scalp edema/hematoma noted. Bone windows show no acute calvarial injury. There is atherosclerotic calcification of the internal carotid arteries. Paranasal and mastoid air  cells are normally aerated. CT CERVICAL SPINE FINDINGS Enlarged right thyroid lobe with low-attenuation lesion and rim calcified lesion appears stable since previous CT of the cervical spine in 2011. There is moderate degenerative change in the midcervical spine, most notable at C5-6 and C6-7. No acute fracture or traumatic subluxation. There is a right pleural effusion and left atelectasis or effusion no warranting further evaluation with chest x-ray. IMPRESSION: 1. Atrophy and small vessel disease. 2.  No evidence for acute intracranial abnormality. 3. Left parietal scalp edema. 4.  No evidence for acute cervical spine abnormality. 5. Stable probable goiter of the thyroid gland. 6. Moderate right pleural effusion suspected. Chest x-ray is recommended. Electronically Signed   By: Norva PavlovElizabeth  Brown M.D.   On: 04/16/2015 16:40   I have personally reviewed and evaluated these images and lab results as part of my medical decision-making.   EKG Interpretation None      MDM  Patient was seen and evaluated in stable condition.  Patient well appearing and initially fall sounded mechanical.  Laceration on posterior  scalp closed without complication, boostrix given.  On further talking to the patient she was intermittently hypoxic and her CT cervical spine was noted to be concerning for possible pleural effusion.  Patient reported just not feeling well over all without specific complaint.  In light of these new findings labs, imaging, and EKG ordered.  Case signed out to night team pending results. Final diagnoses:  None    1. Scalp laceration  2. Mechanical fall    Leta Baptist, MD 04/16/15 1746

## 2015-04-16 NOTE — ED Notes (Signed)
Patient states that her head is a little sore.  A small Laceration is noted on her left posteriolateral head. It measures approximately .5 cm. Bleeding is controlled. Patient denies LOC.

## 2015-04-17 ENCOUNTER — Inpatient Hospital Stay (HOSPITAL_COMMUNITY): Payer: Medicare Other

## 2015-04-17 ENCOUNTER — Encounter (HOSPITAL_COMMUNITY): Payer: Self-pay

## 2015-04-17 DIAGNOSIS — S0101XA Laceration without foreign body of scalp, initial encounter: Secondary | ICD-10-CM | POA: Diagnosis present

## 2015-04-17 DIAGNOSIS — E876 Hypokalemia: Secondary | ICD-10-CM | POA: Diagnosis not present

## 2015-04-17 DIAGNOSIS — J90 Pleural effusion, not elsewhere classified: Secondary | ICD-10-CM | POA: Diagnosis present

## 2015-04-17 DIAGNOSIS — W19XXXS Unspecified fall, sequela: Secondary | ICD-10-CM

## 2015-04-17 DIAGNOSIS — Z66 Do not resuscitate: Secondary | ICD-10-CM | POA: Diagnosis present

## 2015-04-17 DIAGNOSIS — Z7901 Long term (current) use of anticoagulants: Secondary | ICD-10-CM | POA: Diagnosis not present

## 2015-04-17 DIAGNOSIS — I509 Heart failure, unspecified: Secondary | ICD-10-CM | POA: Diagnosis not present

## 2015-04-17 DIAGNOSIS — I482 Chronic atrial fibrillation: Secondary | ICD-10-CM | POA: Diagnosis not present

## 2015-04-17 DIAGNOSIS — I5031 Acute diastolic (congestive) heart failure: Secondary | ICD-10-CM | POA: Diagnosis not present

## 2015-04-17 DIAGNOSIS — E039 Hypothyroidism, unspecified: Secondary | ICD-10-CM | POA: Diagnosis present

## 2015-04-17 DIAGNOSIS — I4891 Unspecified atrial fibrillation: Secondary | ICD-10-CM | POA: Diagnosis not present

## 2015-04-17 DIAGNOSIS — I350 Nonrheumatic aortic (valve) stenosis: Secondary | ICD-10-CM | POA: Diagnosis present

## 2015-04-17 DIAGNOSIS — M109 Gout, unspecified: Secondary | ICD-10-CM | POA: Diagnosis present

## 2015-04-17 DIAGNOSIS — Z8249 Family history of ischemic heart disease and other diseases of the circulatory system: Secondary | ICD-10-CM | POA: Diagnosis not present

## 2015-04-17 DIAGNOSIS — D696 Thrombocytopenia, unspecified: Secondary | ICD-10-CM | POA: Diagnosis present

## 2015-04-17 DIAGNOSIS — E785 Hyperlipidemia, unspecified: Secondary | ICD-10-CM | POA: Diagnosis present

## 2015-04-17 DIAGNOSIS — R0902 Hypoxemia: Secondary | ICD-10-CM | POA: Diagnosis present

## 2015-04-17 DIAGNOSIS — J918 Pleural effusion in other conditions classified elsewhere: Secondary | ICD-10-CM | POA: Diagnosis present

## 2015-04-17 DIAGNOSIS — I110 Hypertensive heart disease with heart failure: Secondary | ICD-10-CM | POA: Diagnosis present

## 2015-04-17 DIAGNOSIS — W19XXXA Unspecified fall, initial encounter: Secondary | ICD-10-CM | POA: Diagnosis not present

## 2015-04-17 DIAGNOSIS — W010XXA Fall on same level from slipping, tripping and stumbling without subsequent striking against object, initial encounter: Secondary | ICD-10-CM | POA: Diagnosis present

## 2015-04-17 DIAGNOSIS — Z9181 History of falling: Secondary | ICD-10-CM | POA: Diagnosis not present

## 2015-04-17 DIAGNOSIS — E119 Type 2 diabetes mellitus without complications: Secondary | ICD-10-CM | POA: Diagnosis present

## 2015-04-17 DIAGNOSIS — Z955 Presence of coronary angioplasty implant and graft: Secondary | ICD-10-CM | POA: Diagnosis not present

## 2015-04-17 DIAGNOSIS — Z96652 Presence of left artificial knee joint: Secondary | ICD-10-CM | POA: Diagnosis present

## 2015-04-17 DIAGNOSIS — I2510 Atherosclerotic heart disease of native coronary artery without angina pectoris: Secondary | ICD-10-CM | POA: Diagnosis present

## 2015-04-17 DIAGNOSIS — Y92129 Unspecified place in nursing home as the place of occurrence of the external cause: Secondary | ICD-10-CM | POA: Diagnosis not present

## 2015-04-17 DIAGNOSIS — I447 Left bundle-branch block, unspecified: Secondary | ICD-10-CM | POA: Diagnosis present

## 2015-04-17 DIAGNOSIS — I5033 Acute on chronic diastolic (congestive) heart failure: Secondary | ICD-10-CM

## 2015-04-17 DIAGNOSIS — I272 Other secondary pulmonary hypertension: Secondary | ICD-10-CM | POA: Diagnosis present

## 2015-04-17 DIAGNOSIS — I10 Essential (primary) hypertension: Secondary | ICD-10-CM | POA: Diagnosis not present

## 2015-04-17 DIAGNOSIS — J9601 Acute respiratory failure with hypoxia: Secondary | ICD-10-CM | POA: Diagnosis not present

## 2015-04-17 LAB — CBC WITH DIFFERENTIAL/PLATELET
BASOS PCT: 0 %
Basophils Absolute: 0 10*3/uL (ref 0.0–0.1)
EOS ABS: 0.1 10*3/uL (ref 0.0–0.7)
EOS PCT: 2 %
HCT: 43 % (ref 36.0–46.0)
Hemoglobin: 14.4 g/dL (ref 12.0–15.0)
Lymphocytes Relative: 11 %
Lymphs Abs: 1 10*3/uL (ref 0.7–4.0)
MCH: 30.3 pg (ref 26.0–34.0)
MCHC: 33.5 g/dL (ref 30.0–36.0)
MCV: 90.5 fL (ref 78.0–100.0)
MONO ABS: 0.7 10*3/uL (ref 0.1–1.0)
MONOS PCT: 8 %
Neutro Abs: 6.9 10*3/uL (ref 1.7–7.7)
Neutrophils Relative %: 79 %
Platelets: 130 10*3/uL — ABNORMAL LOW (ref 150–400)
RBC: 4.75 MIL/uL (ref 3.87–5.11)
RDW: 14.3 % (ref 11.5–15.5)
WBC: 8.6 10*3/uL (ref 4.0–10.5)

## 2015-04-17 LAB — BODY FLUID CELL COUNT WITH DIFFERENTIAL
EOS FL: 1 %
Lymphs, Fluid: 45 %
MONOCYTE-MACROPHAGE-SEROUS FLUID: 36 % — AB (ref 50–90)
Neutrophil Count, Fluid: 18 % (ref 0–25)
WBC FLUID: 266 uL (ref 0–1000)

## 2015-04-17 LAB — GLUCOSE, SEROUS FLUID: Glucose, Fluid: 137 mg/dL

## 2015-04-17 LAB — GRAM STAIN

## 2015-04-17 LAB — COMPREHENSIVE METABOLIC PANEL
ALBUMIN: 3.7 g/dL (ref 3.5–5.0)
ALK PHOS: 95 U/L (ref 38–126)
ALT: 18 U/L (ref 14–54)
AST: 27 U/L (ref 15–41)
Anion gap: 12 (ref 5–15)
BUN: 22 mg/dL — ABNORMAL HIGH (ref 6–20)
CALCIUM: 9.2 mg/dL (ref 8.9–10.3)
CO2: 26 mmol/L (ref 22–32)
CREATININE: 1.15 mg/dL — AB (ref 0.44–1.00)
Chloride: 104 mmol/L (ref 101–111)
GFR calc non Af Amer: 39 mL/min — ABNORMAL LOW (ref >60)
GFR, EST AFRICAN AMERICAN: 45 mL/min — AB (ref >60)
GLUCOSE: 124 mg/dL — AB (ref 65–99)
Potassium: 3.5 mmol/L (ref 3.5–5.1)
SODIUM: 142 mmol/L (ref 135–145)
Total Bilirubin: 1.6 mg/dL — ABNORMAL HIGH (ref 0.3–1.2)
Total Protein: 6.2 g/dL — ABNORMAL LOW (ref 6.5–8.1)

## 2015-04-17 LAB — TROPONIN I
Troponin I: 0.09 ng/mL — ABNORMAL HIGH (ref <0.031)
Troponin I: 0.1 ng/mL — ABNORMAL HIGH (ref <0.031)
Troponin I: 0.1 ng/mL — ABNORMAL HIGH (ref <0.031)

## 2015-04-17 LAB — PROTIME-INR
INR: 1.4 (ref 0.00–1.49)
PROTHROMBIN TIME: 17.3 s — AB (ref 11.6–15.2)

## 2015-04-17 LAB — PROTEIN, BODY FLUID: Total protein, fluid: <3 g/dL

## 2015-04-17 LAB — TSH: TSH: 0.994 u[IU]/mL (ref 0.350–4.500)

## 2015-04-17 LAB — MRSA PCR SCREENING: MRSA BY PCR: NEGATIVE

## 2015-04-17 MED ORDER — IOHEXOL 300 MG/ML  SOLN
75.0000 mL | Freq: Once | INTRAMUSCULAR | Status: AC | PRN
  Administered 2015-04-17: 75 mL via INTRAVENOUS

## 2015-04-17 MED ORDER — CARVEDILOL 25 MG PO TABS
25.0000 mg | ORAL_TABLET | Freq: Two times a day (BID) | ORAL | Status: DC
  Administered 2015-04-18 – 2015-04-21 (×6): 25 mg via ORAL
  Filled 2015-04-17 (×7): qty 1

## 2015-04-17 MED ORDER — ACETAMINOPHEN 325 MG PO TABS
650.0000 mg | ORAL_TABLET | ORAL | Status: DC | PRN
  Administered 2015-04-18 – 2015-04-19 (×2): 650 mg via ORAL
  Filled 2015-04-17 (×2): qty 2

## 2015-04-17 MED ORDER — LIDOCAINE HCL (PF) 1 % IJ SOLN
INTRAMUSCULAR | Status: AC
  Filled 2015-04-17: qty 10

## 2015-04-17 MED ORDER — SODIUM CHLORIDE 0.9 % IJ SOLN: 3.0000 mL | INTRAMUSCULAR | Status: DC | PRN

## 2015-04-17 MED ORDER — CARVEDILOL 25 MG PO TABS
25.0000 mg | ORAL_TABLET | Freq: Two times a day (BID) | ORAL | Status: DC
  Administered 2015-04-17 (×2): 25 mg via ORAL
  Filled 2015-04-17 (×2): qty 1

## 2015-04-17 MED ORDER — POTASSIUM CHLORIDE CRYS ER 20 MEQ PO TBCR
20.0000 meq | EXTENDED_RELEASE_TABLET | Freq: Two times a day (BID) | ORAL | Status: DC
  Administered 2015-04-17 – 2015-04-19 (×7): 20 meq via ORAL
  Filled 2015-04-17 (×7): qty 1

## 2015-04-17 MED ORDER — SODIUM CHLORIDE 0.9 % IJ SOLN
3.0000 mL | Freq: Two times a day (BID) | INTRAMUSCULAR | Status: DC
  Administered 2015-04-17 – 2015-04-21 (×10): 3 mL via INTRAVENOUS

## 2015-04-17 MED ORDER — ATORVASTATIN CALCIUM 20 MG PO TABS
20.0000 mg | ORAL_TABLET | Freq: Every day | ORAL | Status: DC
  Administered 2015-04-17 – 2015-04-20 (×4): 20 mg via ORAL
  Filled 2015-04-17 (×4): qty 1

## 2015-04-17 MED ORDER — FUROSEMIDE 10 MG/ML IJ SOLN
40.0000 mg | Freq: Two times a day (BID) | INTRAMUSCULAR | Status: DC
  Administered 2015-04-17 – 2015-04-18 (×4): 40 mg via INTRAVENOUS
  Filled 2015-04-17 (×4): qty 4

## 2015-04-17 MED ORDER — CARVEDILOL 25 MG PO TABS: 25.0000 mg | ORAL_TABLET | Freq: Two times a day (BID) | ORAL | Status: DC

## 2015-04-17 MED ORDER — SODIUM CHLORIDE 0.9 % IV SOLN: 250.0000 mL | INTRAVENOUS | Status: DC | PRN

## 2015-04-17 MED ORDER — ONDANSETRON HCL 4 MG/2ML IJ SOLN: 4.0000 mg | Freq: Four times a day (QID) | INTRAMUSCULAR | Status: DC | PRN

## 2015-04-17 MED ORDER — LEVOTHYROXINE SODIUM 112 MCG PO TABS
112.0000 ug | ORAL_TABLET | Freq: Every day | ORAL | Status: DC
  Administered 2015-04-17 – 2015-04-21 (×5): 112 ug via ORAL
  Filled 2015-04-17 (×5): qty 1

## 2015-04-17 MED ORDER — IRBESARTAN 300 MG PO TABS
150.0000 mg | ORAL_TABLET | Freq: Every day | ORAL | Status: DC
  Administered 2015-04-17: 150 mg via ORAL
  Filled 2015-04-17: qty 1

## 2015-04-17 NOTE — Procedures (Signed)
US guided diagnostic/therapeutic right thoracentesis performed yielding 950 cc clear, yellow fluid. The fluid was sent to the lab for preordered studies. F/u CXR pending. No immediate complications.

## 2015-04-17 NOTE — Evaluation (Signed)
Physical Therapy Evaluation Patient Details Name: Jacqueline [name] MRN: [id number] DOB: 29-Jun-1916 Today's Date: 04/17/2015   History of Present Illness  Patient is a 81 y/o female presents with hypoxia and s/p fall at home  CXR-showed right lower lobe process combination of effusion and atelectasis versus infiltrate. s/p thoracentesis. PMH includes CAD s/p cath, HTN, mild aortic stenosis, NSTEMI, DM, CHF, A-fib.  Clinical Impression  Patient presents with functional limitations due to deficits listed in PT problem list (see below). Tolerated short distance ambulation on RA however Sp02 dropped to 90% with dyspnea noted. High falls risk. Requires Min A for balance/safety at this time. Recommend ST SNF with possible transfer to ALF to maximize independence and mobility and to decrease fall risk. Will follow acutely.     Follow Up Recommendations SNF    Equipment Recommendations  None recommended by PT    Recommendations for Other Services OT consult     Precautions / Restrictions Precautions Precautions: Fall Restrictions Weight Bearing Restrictions: No      Mobility  Bed Mobility Overal bed mobility: Needs Assistance Bed Mobility: Supine to Sit     Supine to sit: Supervision;HOB elevated     General bed mobility comments: Use of rails and increased time/effort but no assist needed.  Transfers Overall transfer level: Needs assistance Equipment used: Rolling walker (2 wheeled) Transfers: Sit to/from Stand Sit to Stand: Min assist         General transfer comment: Min A to boost from EOB with cues for hand placement/technique. Transferred to chair post ambulation bout.  Ambulation/Gait   Ambulation Distance (Feet): 20 Feet Assistive device: Rolling walker (2 wheeled) Gait Pattern/deviations: Step-through pattern;Decreased stride length;Trunk flexed Gait velocity: decreased   General Gait Details: Slow, unsteady gait. Min A for balance. Knee instability noted  bilaterally. Sp02 dropped to 90% on RA. Dyspnea present 2/4.  Stairs            Wheelchair Mobility    Modified Rankin (Stroke Patients Only)       Balance Overall balance assessment: Needs assistance Sitting-balance support: Feet supported;Bilateral upper extremity supported Sitting balance-Leahy Scale: Fair     Standing balance support: During functional activity Standing balance-Leahy Scale: Poor Standing balance comment: Relient on RW for support.                              Pertinent Vitals/Pain Pain Assessment: No/denies pain    Home Living Family/patient expects to be discharged to:: Skilled nursing facility                 Additional Comments: lives at [facility] in independent living apt.     Prior Function Level of Independence: Independent with assistive device(s)         Comments: Uses scooter for going to the dining hall and rollator for household distances. Reports multiple falls.      Hand Dominance   Dominant Hand: Right    Extremity/Trunk Assessment   Upper Extremity Assessment: Defer to OT evaluation           Lower Extremity Assessment: Generalized weakness         Communication   Communication: HOH  Cognition Arousal/Alertness: Awake/alert Behavior During Therapy: WFL for tasks assessed/performed Overall Cognitive Status: Within Functional Limits for tasks assessed                      General Comments General comments (  skin integrity, edema, etc.): Swelling noted in BLE distal to knees.    Exercises        Assessment/Plan    PT Assessment Patient needs continued PT services  PT Diagnosis Difficulty walking;Generalized weakness   PT Problem List Decreased strength;Cardiopulmonary status limiting activity;Decreased activity tolerance;Decreased balance;Decreased mobility  PT Treatment Interventions Balance training;Gait training;Functional mobility training;Therapeutic  activities;Therapeutic exercise;Patient/family education   PT Goals (Current goals can be found in the Care Plan section) Acute Rehab PT Goals Patient Stated Goal: to feel better PT Goal Formulation: With patient Time For Goal Achievement: 05/01/15 Potential to Achieve Goals: Fair    Frequency Min 2X/week   Barriers to discharge Decreased caregiver support Pt alone in independent living apt    Co-evaluation               End of Session Equipment Utilized During Treatment: Gait belt Activity Tolerance: Patient limited by fatigue Patient left: in chair;with call bell/phone within reach;with chair alarm set Nurse Communication: Mobility status         Time: [id number] PT Time Calculation (min) (ACUTE ONLY): 21 min   Charges:   PT Evaluation $Initial PT Evaluation Tier I: 1 Procedure     PT G Codes:        Gibbs Naugle A Bowie Doiron 04/17/2015, 4:18 PM Mylo RedShauna Addeline Calarco, PT, DPT [phone]

## 2015-04-17 NOTE — Progress Notes (Signed)
*    Echocardiogram 2D Echocardiogram has been performed. Marisue Humblelexis N Nathanael Krist 04/17/2015, 2:46 PM

## 2015-04-17 NOTE — Progress Notes (Signed)
Patient heart rhythm/rate is Afib sustaining in the 50-60's, but it was noted that around shift change patient's rate started to drop into the high 30's-unsustained. The patient was asleep, when awoken her rate returned to the 60's. T.Callahan notified. Received parameters for patient's Coreg. Patient is currently resting comfortably, will continue to monitor. Troy SineWalker, Vihaan Gloss M

## 2015-04-17 NOTE — Progress Notes (Signed)
[facility] PROGRESS NOTE  Jacqueline [name] ZOX:[id number] DOB: [birth year] DOA: 04/16/2015 PCP: Pamelia Hoit, MD  Assessment/Plan: Active Problems:   Data troponin -Patient is not complaining of any chest discomfort I suspect this is secondary to demand ischemia. - Awaiting echocardiogram results    Acute diastolic CHF (congestive heart failure) (HCC) - Follow-up with echocardiogram - Patient on Lasix IV    Hypertension - We'll continue patient on carvedilol and Avapro     Atrial fibrillation (HCC) - Patient on beta blocker for rate control - History of falls as such most likely reason patient is not on anticoagulants.    Pleural effusion - Status post thoracentesis which yielded 950 mL of pleural fluid   Fall - We'll obtain PT evaluation   Hypoxia - Patient reports much improvement after thoracentesis   Code Status: DO NOT RESUSCITATE Family Communication: Discussed with son Disposition Plan: *Pending improvement in condition   Consultants:  None  Procedures: Thoracentesis   Antibiotics:  None  HPI/Subjective: Patient has no new complaints. No acute issues overnight  Objective: Filed Vitals:   04/17/15 1304  BP: 148/90  Pulse:   Temp:   Resp:     Intake/Output Summary (Last 24 hours) at 04/17/15 1504 Last data filed at 04/17/15 1106  Gross per 24 hour  Intake    240 ml  Output   1325 ml  Net  -1085 ml   Filed Weights   04/17/15 0117  Weight: 81.257 kg (179 lb 2.2 oz)    Exam:   General:  Patient in no acute distress, alert and awake  Cardiovascular: S1 and S2 within normal limits, no rubs  Respiratory: No increased work of breathing, speaking in full sentences, equal chest rise  Abdomen: Soft, nondistended, nontender  Musculoskeletal: No cyanosis or clubbing  Data Reviewed: Basic Metabolic Panel:  Recent Labs Lab 04/16/15 1802 04/17/15 0244  NA 140 142  K 3.8 3.5  CL 106 104  CO2 24 26  GLUCOSE 119* 124*  BUN  22* 22*  CREATININE 1.08* 1.15*  CALCIUM 9.3 9.2   Liver Function Tests:  Recent Labs Lab 04/16/15 1802 04/17/15 0244  AST 25 27  ALT 20 18  ALKPHOS 108 95  BILITOT 1.3* 1.6*  PROT 6.5 6.2*  ALBUMIN 3.9 3.7   No results for input(s): LIPASE, AMYLASE in the last 168 hours. No results for input(s): AMMONIA in the last 168 hours. CBC:  Recent Labs Lab 04/16/15 1802 04/17/15 0244  WBC 10.7* 8.6  NEUTROABS 9.0* 6.9  HGB 14.4 14.4  HCT 43.3 43.0  MCV 90.0 90.5  PLT 127* 130*   Cardiac Enzymes:  Recent Labs Lab 04/17/15 0244 04/17/15 0803  TROPONINI 0.09* 0.10*   BNP (last 3 results)  Recent Labs  04/16/15 1821  BNP 278.6*    ProBNP (last 3 results) No results for input(s): PROBNP in the last 8760 hours.  CBG: No results for input(s): GLUCAP in the last 168 hours.  Recent Results (from the past 240 hour(s))  MRSA PCR Screening     Status: None   Collection Time: 04/17/15  2:29 AM  Result Value Ref Range Status   MRSA by PCR NEGATIVE NEGATIVE Final    Comment:        The GeneXpert MRSA Assay (FDA approved for NASAL specimens only), is one component of a comprehensive MRSA colonization surveillance program. It is not intended to diagnose MRSA infection nor to guide or monitor treatment for MRSA infections.   Culture, body  fluid-bottle     Status: None (Preliminary result)   Collection Time: 04/17/15  1:11 PM  Result Value Ref Range Status   Specimen Description FLUID RIGHT PLEURAL  Final   Special Requests BAA 10CCS  Final   Culture PENDING  Incomplete   Report Status PENDING  Incomplete  Gram stain     Status: None   Collection Time: 04/17/15  1:11 PM  Result Value Ref Range Status   Specimen Description FLUID RIGHT PLEURAL  Final   Special Requests NONE  Final   Gram Stain   Final    MODERATE WBC PRESENT,BOTH PMN AND MONONUCLEAR NO ORGANISMS SEEN    Report Status 04/17/2015 FINAL  Final     Studies: Dg Chest 1 View  04/17/2015   CLINICAL DATA:  Status post right thoracentesis.  No pain. EXAM: CHEST 1 VIEW COMPARISON:  04/16/2015 FINDINGS: There is decreased pleural fluid on the right following thoracentesis. No pneumothorax. Stable bilateral interstitial thickening. Cardiac silhouette is mildly enlarged. No mediastinal or hilar masses. IMPRESSION: 1. Decreased right pleural effusion following thoracentesis. No pneumothorax. No other change. Electronically Signed   By: Amie Portlandavid  Ormond M.D.   On: 04/17/2015 13:19   Dg Chest 2 View  04/16/2015  CLINICAL DATA:  [facility] today at [facility].  Chest pain. EXAM: CHEST  2 VIEW COMPARISON:  05/16/2013 FINDINGS: The heart is enlarged but stable. There is tortuosity and calcification of the thoracic aorta. Right lower lobe process is a combination of elevated right hemidiaphragm, pleural effusion and atelectasis or infiltrate. Mild vascular congestion and slightly thickened interstitial lines suggesting mild interstitial edema. The bony thorax is grossly intact. Stable exaggerated thoracic kyphosis but no acute fracture. IMPRESSION: Stable cardiac enlargement and chronic lung changes. Right lower lobe process likely a combination of effusion and atelectasis or infiltrate. Vascular congestion and probable mild interstitial edema. Electronically Signed   By: Rudie MeyerP.  Gallerani M.D.   On: 04/16/2015 18:11   Ct Head Wo Contrast  04/16/2015  CLINICAL DATA:  [facility] Patient states that she tripped over her feet and fell backwards. Denies dizziness. Lac to back of head. EXAM: CT HEAD WITHOUT CONTRAST CT CERVICAL SPINE WITHOUT CONTRAST TECHNIQUE: Multidetector CT imaging of the head and cervical spine was performed following the standard protocol without intravenous contrast. Multiplanar CT image reconstructions of the cervical spine were also generated. COMPARISON:  03/30/2010 FINDINGS: CT HEAD FINDINGS There is central and cortical atrophy. Periventricular  white matter changes are consistent with small vessel disease. Old lacunar infarct is identified within the right basal ganglia, stable in appearance. There is no intra or extra-axial fluid collection or mass lesion. The basilar cisterns and ventricles have a normal appearance. There is no CT evidence for acute infarction or hemorrhage. Left posterior parietal scalp edema/hematoma noted. Bone windows show no acute calvarial injury. There is atherosclerotic calcification of the internal carotid arteries. Paranasal and mastoid air cells are normally aerated. CT CERVICAL SPINE FINDINGS Enlarged right thyroid lobe with low-attenuation lesion and rim calcified lesion appears stable since previous CT of the cervical spine in 2011. There is moderate degenerative change in the midcervical spine, most notable at C5-6 and C6-7. No acute fracture or traumatic subluxation. There is a right pleural effusion and left atelectasis or effusion no warranting further evaluation with chest x-ray. IMPRESSION: 1. Atrophy and small vessel disease. 2.  No evidence for acute intracranial abnormality. 3. Left parietal scalp edema. 4.  No evidence for  acute cervical spine abnormality. 5. Stable probable goiter of the thyroid gland. 6. Moderate right pleural effusion suspected. Chest x-ray is recommended. Electronically Signed   By: Norva Pavlov M.D.   On: 04/16/2015 16:40   Ct Chest W Contrast  04/17/2015  CLINICAL DATA:  Shortness breath and weakness for several days. EXAM: CT CHEST WITH CONTRAST TECHNIQUE: Multidetector CT imaging of the chest was performed during intravenous contrast administration. CONTRAST:  75mL OMNIPAQUE IOHEXOL 300 MG/ML  SOLN COMPARISON:  None. FINDINGS: THORACIC INLET/BODY WALL: Multi nodular goiter, grossly stable from cervical spine CT in 2011. MEDIASTINUM: Mild biatrial enlargement. No pericardial effusion. Diffuse atherosclerosis, including the coronary arteries. LAD stenting. No acute aortic findings.  There is chronic calcified granulomatous changes to the bilateral hila. LUNG WINDOWS: Posterior segment left upper lobe bandlike opacity with occluded bronchi. No discrete mass lesion is seen. Small left and moderate right pleural effusion with atelectasis in the lower lobes. No loculation or complexity to the effusions. There are calcified pulmonary nodules. No suspicious noncalcified nodules. UPPER ABDOMEN: No acute finding.  Granulomatous changes in the spleen. OSSEOUS: No acute fracture. No suspicious lytic or blastic lesions. Exaggerated thoracic kyphosis. Osteopenia. IMPRESSION: 1. Moderate right and small left layering pleural effusions with dependent atelectasis. 2. Obstructed left upper lobe posterior segment bronchus with segmental collapse. Given the atelectasis was seen on 2011 cervical spine CT, this is likely post infectious/inflammatory scarring. 3. Additional chronic findings are described above. Electronically Signed   By: Marnee Spring M.D.   On: 04/17/2015 04:31   Ct Cervical Spine Wo Contrast  04/16/2015  CLINICAL DATA:  Patient to ED per EMS From [facility] post fall. Patient states that she tripped over her feet and fell backwards. Denies dizziness. Lac to back of head. EXAM: CT HEAD WITHOUT CONTRAST CT CERVICAL SPINE WITHOUT CONTRAST TECHNIQUE: Multidetector CT imaging of the head and cervical spine was performed following the standard protocol without intravenous contrast. Multiplanar CT image reconstructions of the cervical spine were also generated. COMPARISON:  03/30/2010 FINDINGS: CT HEAD FINDINGS There is central and cortical atrophy. Periventricular white matter changes are consistent with small vessel disease. Old lacunar infarct is identified within the right basal ganglia, stable in appearance. There is no intra or extra-axial fluid collection or mass lesion. The basilar cisterns and ventricles have a normal appearance. There is no CT evidence for acute infarction or  hemorrhage. Left posterior parietal scalp edema/hematoma noted. Bone windows show no acute calvarial injury. There is atherosclerotic calcification of the internal carotid arteries. Paranasal and mastoid air cells are normally aerated. CT CERVICAL SPINE FINDINGS Enlarged right thyroid lobe with low-attenuation lesion and rim calcified lesion appears stable since previous CT of the cervical spine in 2011. There is moderate degenerative change in the midcervical spine, most notable at C5-6 and C6-7. No acute fracture or traumatic subluxation. There is a right pleural effusion and left atelectasis or effusion no warranting further evaluation with chest x-ray. IMPRESSION: 1. Atrophy and small vessel disease. 2.  No evidence for acute intracranial abnormality. 3. Left parietal scalp edema. 4.  No evidence for acute cervical spine abnormality. 5. Stable probable goiter of the thyroid gland. 6. Moderate right pleural effusion suspected. Chest x-ray is recommended. Electronically Signed   By: Norva Pavlov M.D.   On: 04/16/2015 16:40   US Thoracentesis Asp Pleural Space W/img Guide  04/17/2015  INDICATION: CHF, dyspnea, bilateral pleural effusions, right greater than left. Request made for diagnostic and therapeutic right thoracentesis. EXAM: ULTRASOUND GUIDED  DIAGNOSTIC AND THERAPEUTIC RIGHT THORACENTESIS COMPARISON:  None. MEDICATIONS: None COMPLICATIONS: None immediate TECHNIQUE: Informed written consent was obtained from the patient after a discussion of the risks, benefits and alternatives to treatment. A timeout was performed prior to the initiation of the procedure. Initial ultrasound scanning demonstrates a moderate-to-large right pleural effusion. The lower chest was prepped and draped in the usual sterile fashion. 1% lidocaine was used for local anesthesia. An ultrasound image was saved for documentation purposes. A 6 Fr Safe-T-Centesis catheter was introduced. The thoracentesis was performed. The catheter  was removed and a dressing was applied. The patient tolerated the procedure well without immediate post procedural complication. The patient was escorted to have an upright chest radiograph. FINDINGS: A total of approximately 950 cc of clear, yellow fluid was removed. Requested samples were sent to the laboratory. IMPRESSION: Successful ultrasound-guided diagnostic and therapeutic right sided thoracentesis yielding 950 cc of pleural fluid. Read by: Jeananne Rama, PA-C Electronically Signed   By: Judie Petit.  Shick M.D.   On: 04/17/2015 13:07    Scheduled Meds: . atorvastatin  20 mg Oral q1800  . carvedilol  25 mg Oral BID WC  . furosemide  40 mg Intravenous BID  . irbesartan  150 mg Oral Daily  . levothyroxine  112 mcg Oral QAC breakfast  . lidocaine (PF)  10 mL Intradermal Once  . lidocaine (PF)      . potassium chloride SA  20 mEq Oral BID  . sodium chloride  3 mL Intravenous Q12H   Continuous Infusions:   Time spent: > 35 minutes    Penny Pia  [facility] Pager [phone]. If 7PM-7AM, please contact night-coverage at [email or web address], password [facility] 04/17/2015, 3:04 PM  LOS: 0 days

## 2015-04-18 LAB — BASIC METABOLIC PANEL
Anion gap: 8 (ref 5–15)
BUN: 21 mg/dL — ABNORMAL HIGH (ref 6–20)
CHLORIDE: 104 mmol/L (ref 101–111)
CO2: 27 mmol/L (ref 22–32)
Calcium: 9 mg/dL (ref 8.9–10.3)
Creatinine, Ser: 1.15 mg/dL — ABNORMAL HIGH (ref 0.44–1.00)
GFR calc Af Amer: 45 mL/min — ABNORMAL LOW (ref >60)
GFR calc non Af Amer: 39 mL/min — ABNORMAL LOW (ref >60)
Glucose, Bld: 98 mg/dL (ref 65–99)
POTASSIUM: 3.8 mmol/L (ref 3.5–5.1)
SODIUM: 139 mmol/L (ref 135–145)

## 2015-04-18 MED ORDER — IRBESARTAN 300 MG PO TABS
300.0000 mg | ORAL_TABLET | Freq: Every day | ORAL | Status: DC
  Administered 2015-04-18 – 2015-04-21 (×4): 300 mg via ORAL
  Filled 2015-04-18 (×4): qty 1

## 2015-04-18 NOTE — Progress Notes (Signed)
[facility] PROGRESS NOTE Interim History: 81 year old with past history of hypertension and mild aortic stenosis chronic atrial fibrillation on Coumadin that filled at [facility] not loses consciousness, workup revealed a pleural effusion denies any cough or fevers. Filed Weights   04/17/15 0117 04/18/15 0442  Weight: 81.257 kg (179 lb 2.2 oz) 78.881 kg (173 lb 14.4 oz)        Intake/Output Summary (Last 24 hours) at 04/18/15 1610 Last data filed at 04/18/15 0932  Gross per 24 hour  Intake   1440 ml  Output   1075 ml  Net    365 ml     Assessment/Plan: Acute on chronic diastolic congestive heart failure (HCC): - ProBNP was 276, he seems fluid overloaded on physical exam. - Status post thoracocentesis on 04/17/2015 Lytes criteria for pleural effusion pending. - Continue strict I's and O's daily weight monitoring electrolytes and creatinine. - Increase ARB. Restrict her fluids. Apply TED hose. - Echocardiogram showed no diastolic dysfunction with mild aortic stenosis, I still think she has acute decompensated diastolic heart failure.  Fall: - C-spine shows no fracture. - PT OT consult will probably due to acute decompensated heart failure  Chronic atrial fibrillation (HCC): - Continue beta blockers for rate control, not a candidate for antegrade ablation due to frequent falls.  Essential hypertension: - Blood pressure normal at goal increase her ARB.  Right Pleural effusion: - Lites criteria pending likely transudate     Code Status: DNR/DNI Family Communication: none  Disposition Plan: inpatient   Consultants:  none  Procedures: ECHO: Systolic function was normal. The estimated ejection fraction was in the range of 60% to 65%. Wall motion was normal; there were no regional wall motion abnormalities. - Aortic valve: Moderately calcified annulus. Trileaflet. Mild diffuse thickening and calcification. There was very mild stenosis. There was mild  regurgitation. Valve area (VTI): 1.72 cm^2. Valve area (Vmax): 1.43 cm^2. Valve area (Vmean): 1.41 cm^2.  Mitral valve: Calcified annulus. There was mild regurgitation. Left atrium: The atrium was moderately dilated. Right atrium: The atrium was moderately to severely dilated. Pulmonary arteries: PA peak pressure: 57 mm Hg.  Antibiotics:  None  HPI/Subjective: She has her shortness of breath is slightly better, she denies any chest pain short of breath with ambulation  Objective: Filed Vitals:   04/17/15 1241 04/17/15 1252 04/17/15 1304 04/18/15 0442  BP: 158/72 143/71 148/90 164/65  Pulse:    67  Temp:    98.5 F (36.9 C)  TempSrc:    Oral  Resp:    18  Height:      Weight:    78.881 kg (173 lb 14.4 oz)  SpO2:    96%     Exam:  General: Alert, awake, oriented x3, in no acute distress.  HEENT: No bruits, no goiter.  Heart: Regular rate and rhythm, +HJ reflex Lungs: Good air movement, clear to auscultation Abdomen: Soft, nontender, nondistended, positive bowel sounds.  Neuro: Grossly intact, nonfocal.   Data Reviewed: Basic Metabolic Panel:  Recent Labs Lab 04/16/15 1802 04/17/15 0244 04/18/15 0300  NA 140 142 139  K 3.8 3.5 3.8  CL 106 104 104  CO2 GLUCOSE 119* 124* 98  BUN 22* 22* 21*  CREATININE 1.08* 1.15* 1.15*  CALCIUM 9.3 9.2 9.0   Liver Function Tests:  Recent Labs Lab 04/16/15 1802 04/17/15 0244  AST 25 27  ALT 20 18  ALKPHOS 108 95  BILITOT 1.3* 1.6*  PROT 6.5 6.2*  ALBUMIN 3.9 3.7   No results for input(s): LIPASE, AMYLASE in the last 168 hours. No results for input(s): AMMONIA in the last 168 hours. CBC:  Recent Labs Lab 04/16/15 1802 04/17/15 0244  WBC 10.7* 8.6  NEUTROABS 9.0* 6.9  HGB 14.4 14.4  HCT 43.3 43.0  MCV 90.0 90.5  PLT 127* 130*   Cardiac Enzymes:  Recent Labs Lab 04/17/15 0244 04/17/15 0803 04/17/15 1500  TROPONINI 0.09* 0.10* 0.10*   BNP (last 3 results)  Recent Labs  04/16/15 1821  BNP  278.6*    ProBNP (last 3 results) No results for input(s): PROBNP in the last 8760 hours.  CBG: No results for input(s): GLUCAP in the last 168 hours.  Recent Results (from the past 240 hour(s))  MRSA PCR Screening     Status: None   Collection Time: 04/17/15  2:29 AM  Result Value Ref Range Status   MRSA by PCR NEGATIVE NEGATIVE Final    Comment:        The GeneXpert MRSA Assay (FDA approved for NASAL specimens only), is one component of a comprehensive MRSA colonization surveillance program. It is not intended to diagnose MRSA infection nor to guide or monitor treatment for MRSA infections.   Culture, body fluid-bottle     Status: None (Preliminary result)   Collection Time: 04/17/15  1:11 PM  Result Value Ref Range Status   Specimen Description FLUID RIGHT PLEURAL  Final   Special Requests BAA 10CCS  Final   Culture PENDING  Incomplete   Report Status PENDING  Incomplete  Gram stain     Status: None   Collection Time: 04/17/15  1:11 PM  Result Value Ref Range Status   Specimen Description FLUID RIGHT PLEURAL  Final   Special Requests NONE  Final   Gram Stain   Final    MODERATE WBC PRESENT,BOTH PMN AND MONONUCLEAR NO ORGANISMS SEEN    Report Status 04/17/2015 FINAL  Final     Studies: Dg Chest 1 View  04/17/2015  CLINICAL DATA:  Status post right thoracentesis.  No pain. EXAM: CHEST 1 VIEW COMPARISON:  04/16/2015 FINDINGS: There is decreased pleural fluid on the right following thoracentesis. No pneumothorax. Stable bilateral interstitial thickening. Cardiac silhouette is mildly enlarged. No mediastinal or hilar masses. IMPRESSION: 1. Decreased right pleural effusion following thoracentesis. No pneumothorax. No other change. Electronically Signed   By: Amie Portland M.D.   On: 04/17/2015 13:19   Dg Chest 2 View  04/16/2015  CLINICAL DATA:  Larey Seat today at nursing home.  Chest pain. EXAM: CHEST  2 VIEW COMPARISON:  05/16/2013 FINDINGS: The heart is enlarged but  stable. There is tortuosity and calcification of the thoracic aorta. Right lower lobe process is a combination of elevated right hemidiaphragm, pleural effusion and atelectasis or infiltrate. Mild vascular congestion and slightly thickened interstitial lines suggesting mild interstitial edema. The bony thorax is grossly intact. Stable exaggerated thoracic kyphosis but no acute fracture. IMPRESSION: Stable cardiac enlargement and chronic lung changes. Right lower lobe process likely a combination of effusion and atelectasis or infiltrate. Vascular congestion and probable mild interstitial edema. Electronically Signed   By: Rudie Meyer M.D.   On: 04/16/2015 18:11   Ct Head Wo Contrast  04/16/2015  CLINICAL DATA:  Patient to ED per EMS [facility] post fall. Patient states that she tripped over her feet and fell backwards. Denies dizziness. Lac to back of head. EXAM: CT HEAD WITHOUT CONTRAST CT CERVICAL SPINE WITHOUT CONTRAST TECHNIQUE: Multidetector  CT imaging of the head and cervical spine was performed following the standard protocol without intravenous contrast. Multiplanar CT image reconstructions of the cervical spine were also generated. COMPARISON:  03/30/2010 FINDINGS: CT HEAD FINDINGS There is central and cortical atrophy. Periventricular white matter changes are consistent with small vessel disease. Old lacunar infarct is identified within the right basal ganglia, stable in appearance. There is no intra or extra-axial fluid collection or mass lesion. The basilar cisterns and ventricles have a normal appearance. There is no CT evidence for acute infarction or hemorrhage. Left posterior parietal scalp edema/hematoma noted. Bone windows show no acute calvarial injury. There is atherosclerotic calcification of the internal carotid arteries. Paranasal and mastoid air cells are normally aerated. CT CERVICAL SPINE FINDINGS Enlarged right thyroid lobe with low-attenuation lesion and rim calcified  lesion appears stable since previous CT of the cervical spine in 2011. There is moderate degenerative change in the midcervical spine, most notable at C5-6 and C6-7. No acute fracture or traumatic subluxation. There is a right pleural effusion and left atelectasis or effusion no warranting further evaluation with chest x-ray. IMPRESSION: 1. Atrophy and small vessel disease. 2.  No evidence for acute intracranial abnormality. 3. Left parietal scalp edema. 4.  No evidence for acute cervical spine abnormality. 5. Stable probable goiter of the thyroid gland. 6. Moderate right pleural effusion suspected. Chest x-ray is recommended. Electronically Signed   By: Norva Pavlov M.D.   On: 04/16/2015 16:40   Ct Chest W Contrast  04/17/2015  CLINICAL DATA:  Shortness breath and weakness for several days. EXAM: CT CHEST WITH CONTRAST TECHNIQUE: Multidetector CT imaging of the chest was performed during intravenous contrast administration. CONTRAST:  75mL OMNIPAQUE IOHEXOL 300 MG/ML  SOLN COMPARISON:  None. FINDINGS: THORACIC INLET/BODY WALL: Multi nodular goiter, grossly stable from cervical spine CT in 2011. MEDIASTINUM: Mild biatrial enlargement. No pericardial effusion. Diffuse atherosclerosis, including the coronary arteries. LAD stenting. No acute aortic findings. There is chronic calcified granulomatous changes to the bilateral hila. LUNG WINDOWS: Posterior segment left upper lobe bandlike opacity with occluded bronchi. No discrete mass lesion is seen. Small left and moderate right pleural effusion with atelectasis in the lower lobes. No loculation or complexity to the effusions. There are calcified pulmonary nodules. No suspicious noncalcified nodules. UPPER ABDOMEN: No acute finding.  Granulomatous changes in the spleen. OSSEOUS: No acute fracture. No suspicious lytic or blastic lesions. Exaggerated thoracic kyphosis. Osteopenia. IMPRESSION: 1. Moderate right and small left layering pleural effusions with  dependent atelectasis. 2. Obstructed left upper lobe posterior segment bronchus with segmental collapse. Given the atelectasis was seen on 2011 cervical spine CT, this is likely post infectious/inflammatory scarring. 3. Additional chronic findings are described above. Electronically Signed   By: Marnee Spring M.D.   On: 04/17/2015 04:31   Ct Cervical Spine Wo Contrast  04/16/2015  CLINICAL DATA:  Patient to ED per EMS From [facility] post fall. Patient states that she tripped over her feet and fell backwards. Denies dizziness. Lac to back of head. EXAM: CT HEAD WITHOUT CONTRAST CT CERVICAL SPINE WITHOUT CONTRAST TECHNIQUE: Multidetector CT imaging of the head and cervical spine was performed following the standard protocol without intravenous contrast. Multiplanar CT image reconstructions of the cervical spine were also generated. COMPARISON:  03/30/2010 FINDINGS: CT HEAD FINDINGS There is central and cortical atrophy. Periventricular white matter changes are consistent with small vessel disease. Old lacunar infarct is identified within the right basal ganglia, stable in appearance. There is no intra or  extra-axial fluid collection or mass lesion. The basilar cisterns and ventricles have a normal appearance. There is no CT evidence for acute infarction or hemorrhage. Left posterior parietal scalp edema/hematoma noted. Bone windows show no acute calvarial injury. There is atherosclerotic calcification of the internal carotid arteries. Paranasal and mastoid air cells are normally aerated. CT CERVICAL SPINE FINDINGS Enlarged right thyroid lobe with low-attenuation lesion and rim calcified lesion appears stable since previous CT of the cervical spine in 2011. There is moderate degenerative change in the midcervical spine, most notable at C5-6 and C6-7. No acute fracture or traumatic subluxation. There is a right pleural effusion and left atelectasis or effusion no warranting further evaluation with chest  x-ray. IMPRESSION: 1. Atrophy and small vessel disease. 2.  No evidence for acute intracranial abnormality. 3. Left parietal scalp edema. 4.  No evidence for acute cervical spine abnormality. 5. Stable probable goiter of the thyroid gland. 6. Moderate right pleural effusion suspected. Chest x-ray is recommended. Electronically Signed   By: Norva PavlovElizabeth  Brown M.D.   On: 04/16/2015 16:40   [place] Thoracentesis Asp Pleural Space W/img Guide  04/17/2015  INDICATION: CHF, dyspnea, bilateral pleural effusions, right greater than left. Request made for diagnostic and therapeutic right thoracentesis. EXAM: ULTRASOUND GUIDED DIAGNOSTIC AND THERAPEUTIC RIGHT THORACENTESIS COMPARISON:  None. MEDICATIONS: None COMPLICATIONS: None immediate TECHNIQUE: Informed written consent was obtained from the patient after a discussion of the risks, benefits and alternatives to treatment. A timeout was performed prior to the initiation of the procedure. Initial ultrasound scanning demonstrates a moderate-to-large right pleural effusion. The lower chest was prepped and draped in the usual sterile fashion. 1% lidocaine was used for local anesthesia. An ultrasound image was saved for documentation purposes. A 6 Fr Safe-T-Centesis catheter was introduced. The thoracentesis was performed. The catheter was removed and a dressing was applied. The patient tolerated the procedure well without immediate post procedural complication. The patient was escorted to have an upright chest radiograph. FINDINGS: A total of approximately 950 cc of clear, yellow fluid was removed. Requested samples were sent to the laboratory. IMPRESSION: Successful ultrasound-guided diagnostic and therapeutic right sided thoracentesis yielding 950 cc of pleural fluid. Read by: Jeananne RamaKevin Allred, PA-C Electronically Signed   By: Judie PetitM.  Shick M.D.   On: 04/17/2015 13:07    Scheduled Meds: . atorvastatin  20 mg Oral q1800  . carvedilol  25 mg Oral BID WC  . furosemide  40 mg  Intravenous BID  . irbesartan  300 mg Oral Daily  . levothyroxine  112 mcg Oral QAC breakfast  . lidocaine (PF)  10 mL Intradermal Once  . potassium chloride SA  20 mEq Oral BID  . sodium chloride  3 mL Intravenous Q12H   Continuous Infusions:    Marinda ElkFELIZ [name], Jacqueline [name]  [facility] Pager [phone]. If 7PM-7AM, please contact night-coverage at [email or web address], password [email or web address] 04/18/2015, 9:37 AM  LOS: 1 day

## 2015-04-19 LAB — BASIC METABOLIC PANEL
ANION GAP: 8 (ref 5–15)
BUN: 26 mg/dL — ABNORMAL HIGH (ref 6–20)
CHLORIDE: 103 mmol/L (ref 101–111)
CO2: 28 mmol/L (ref 22–32)
Calcium: 8.9 mg/dL (ref 8.9–10.3)
Creatinine, Ser: 1.17 mg/dL — ABNORMAL HIGH (ref 0.44–1.00)
GFR calc non Af Amer: 38 mL/min — ABNORMAL LOW (ref >60)
GFR, EST AFRICAN AMERICAN: 44 mL/min — AB (ref >60)
Glucose, Bld: 104 mg/dL — ABNORMAL HIGH (ref 65–99)
Potassium: 4 mmol/L (ref 3.5–5.1)
Sodium: 139 mmol/L (ref 135–145)

## 2015-04-19 LAB — PH, BODY FLUID: pH, Body Fluid: 7.6

## 2015-04-19 MED ORDER — FUROSEMIDE 10 MG/ML IJ SOLN
80.0000 mg | Freq: Two times a day (BID) | INTRAMUSCULAR | Status: DC
  Administered 2015-04-19 – 2015-04-20 (×3): 80 mg via INTRAVENOUS
  Filled 2015-04-19 (×2): qty 8

## 2015-04-19 MED ORDER — FUROSEMIDE 10 MG/ML IJ SOLN
60.0000 mg | Freq: Two times a day (BID) | INTRAMUSCULAR | Status: DC
  Filled 2015-04-19: qty 6

## 2015-04-19 MED ORDER — FUROSEMIDE 10 MG/ML IJ SOLN: 60.0000 mg | Freq: Two times a day (BID) | INTRAMUSCULAR | Status: DC

## 2015-04-19 NOTE — Progress Notes (Signed)
[facility] PROGRESS NOTE Interim History: 81 year old with past history of hypertension and mild aortic stenosis chronic atrial fibrillation on Cou[facility] not loses consciousness, workup revealed a pleural effusion denies any cough or fevers. Filed Weights   04/17/15 0117 04/18/15 0442 04/19/15 0404  Weight: 81.257 kg (179 lb 2.2 oz) 78.881 kg (173 lb 14.4 oz) 78.472 kg (173 lb)        Intake/Output Summary (Last 24 hours) at 04/19/15 1051 Last data filed at 04/19/15 0854  Gross per 24 hour  Intake   1080 ml  Output   1300 ml  Net   -220 ml     Assessment/Plan: Acute on chronic diastolic congestive heart failure (HCC): - Increase her IV Lasix she still has JVD. - Status post thoracocentesis on 04/17/2015. - Continue strict I's and O's daily weight monitoring electrolytes and creatinine. - Cont. ARB. Restrict her fluids. Apply TED hose. - Echocardiogram showed no diastolic dysfunction with mild aortic stenosis, I still think she has acute decompensated diastolic heart failure.  Fall: - C-spine shows no fracture. - PT OT consult will probably due to acute decompensated heart failure  Chronic atrial fibrillation (HCC): - Continue beta blockers for rate control, not a candidate due to frequent falls.  Essential hypertension: - Blood pressure normal at goal increase her ARB.  Right Pleural effusion: - Lites criteria pending likely transudate    Code Status: DNR/DNI Family Communication: none  Disposition Plan: inpatient   Consultants:  none  Procedures: ECHO: Systolic function was normal. The estimated ejection fraction was in the range of 60% to 65%. Wall motion was normal; there were no regional wall motion abnormalities. - Aortic valve: Moderately calcified annulus. Trileaflet. Mild diffuse thickening and calcification. There was very mild stenosis. There was mild regurgitation. Valve area (VTI): 1.72 cm^2. Valve area  (Vmax): 1.43 cm^2. Valve area (Vmean): 1.41 cm^2.  Mitral valve: Calcified annulus. There was mild regurgitation. Left atrium: The atrium was moderately dilated. Right atrium: The atrium was moderately to severely dilated. Pulmonary arteries: PA peak pressure: 57 mm Hg.  Antibiotics:  None  HPI/Subjective: She has her shortness of breath is  better, she denies any chest pain short of breath with ambulation.  Objective: Filed Vitals:   04/18/15 1252 04/18/15 2013 04/19/15 0404 04/19/15 0420  BP: 123/53 136/53  149/73  Pulse: 62 70  62  Temp: 98.2 F (36.8 C) 98.4 F (36.9 C)  98.2 F (36.8 C)  TempSrc: Oral Oral  Oral  Resp: 20 20  18   Height:      Weight:   78.472 kg (173 lb)   SpO2: 92% 96%  96%     Exam:  General: Alert, awake, oriented x3, in no acute distress.  HEENT: No bruits, no goiter.  Heart: Regular rate and rhythm, +HJ reflex Lungs: Good air movement, clear to auscultation Abdomen: Soft, nontender, nondistended, positive bowel sounds.  Data Reviewed: Basic Metabolic Panel:  Recent Labs Lab 04/16/15 1802 04/17/15 0244 04/18/15 0300 04/19/15 0414  NA 140 142 139 139  K 3.8 3.5 3.8 4.0  CL 106 104 104 103  CO2 24 26 27 28   GLUCOSE 119* 124* 98 104*  BUN 22* 22* 21* 26*  CREATININE 1.08* 1.15* 1.15* 1.17*  CALCIUM 9.3 9.2 9.0 8.9   Liver Function Tests:  Recent Labs Lab 04/16/15 1802 04/17/15 0244  AST 25 27  ALT 20 18  ALKPHOS 108 95  BILITOT 1.3* 1.6*  PROT 6.5 6.2*  ALBUMIN 3.9 3.7   No results for input(s): LIPASE, AMYLASE in the last 168 hours. No results for input(s): AMMONIA in the last 168 hours. CBC:  Recent Labs Lab 04/16/15 1802 04/17/15 0244  WBC 10.7* 8.6  NEUTROABS 9.0* 6.9  HGB 14.4 14.4  HCT 43.3 43.0  MCV 90.0 90.5  PLT 127* 130*   Cardiac Enzymes:  Recent Labs Lab 04/17/15 0244 04/17/15 0803 04/17/15 1500  TROPONINI 0.09* 0.10* 0.10*   BNP (last 3 results)  Recent Labs  04/16/15 1821  BNP 278.6*     ProBNP (last 3 results) No results for input(s): PROBNP in the last 8760 hours.  CBG: No results for input(s): GLUCAP in the last 168 hours.  Recent Results (from the past 240 hour(s))  MRSA PCR Screening     Status: None   Collection Time: 04/17/15  2:29 AM  Result Value Ref Range Status   MRSA by PCR NEGATIVE NEGATIVE Final    Comment:        The GeneXpert MRSA Assay (FDA approved for NASAL specimens only), is one component of a comprehensive MRSA colonization surveillance program. It is not intended to diagnose MRSA infection nor to guide or monitor treatment for MRSA infections.   Culture, body fluid-bottle     Status: None (Preliminary result)   Collection Time: 04/17/15  1:11 PM  Result Value Ref Range Status   Specimen Description FLUID RIGHT PLEURAL  Final   Special Requests BAA 10CCS  Final   Culture NO GROWTH 1 DAY  Final   Report Status PENDING  Incomplete  Gram stain     Status: None   Collection Time: 04/17/15  1:11 PM  Result Value Ref Range Status   Specimen Description FLUID RIGHT PLEURAL  Final   Special Requests NONE  Final   Gram Stain   Final    MODERATE WBC PRESENT,BOTH PMN AND MONONUCLEAR NO ORGANISMS SEEN    Report Status 04/17/2015 FINAL  Final     Studies: Dg Chest 1 View  04/17/2015  CLINICAL DATA:  Status post right thoracentesis.  No pain. EXAM: CHEST 1 VIEW COMPARISON:  04/16/2015 FINDINGS: There is decreased pleural fluid on the right following thoracentesis. No pneumothorax. Stable bilateral interstitial thickening. Cardiac silhouette is mildly enlarged. No mediastinal or hilar masses. IMPRESSION: 1. Decreased right pleural effusion following thoracentesis. No pneumothorax. No other change. Electronically Signed   By: Amie Portland M.D.   On: 04/17/2015 13:19   US Thoracentesis Asp Pleural Space W/img Guide  04/17/2015  INDICATION: CHF, dyspnea, bilateral pleural effusions, right greater than left. Request made for diagnostic and  therapeutic right thoracentesis. EXAM: ULTRASOUND GUIDED DIAGNOSTIC AND THERAPEUTIC RIGHT THORACENTESIS COMPARISON:  None. MEDICATIONS: None COMPLICATIONS: None immediate TECHNIQUE: Informed written consent was obtained from the patient after a discussion of the risks, benefits and alternatives to treatment. A timeout was performed prior to the initiation of the procedure. Initial ultrasound scanning demonstrates a moderate-to-large right pleural effusion. The lower chest was prepped and draped in the usual sterile fashion. 1% lidocaine was used for local anesthesia. An ultrasound image was saved for documentation purposes. A 6 Fr Safe-T-Centesis catheter was introduced. The thoracentesis was performed. The catheter was removed and a dressing was applied. The patient tolerated the procedure well without immediate post procedural complication. The patient was escorted to have an upright chest radiograph. FINDINGS: A total of approximately 950 cc of clear, yellow fluid was removed. Requested samples were sent to the laboratory. IMPRESSION: Successful ultrasound-guided diagnostic  and therapeutic right sided thoracentesis yielding 950 cc of pleural fluid. Read by: Jeananne Rama, PA-C Electronically Signed   By: Judie Petit.  Shick M.D.   On: 04/17/2015 13:07    Scheduled Meds: . atorvastatin  20 mg Oral q1800  . carvedilol  25 mg Oral BID WC  . furosemide  60 mg Intravenous BID  . irbesartan  300 mg Oral Daily  . levothyroxine  112 mcg Oral QAC breakfast  . potassium chloride SA  20 mEq Oral BID  . sodium chloride  3 mL Intravenous Q12H   Continuous Infusions:    Marinda Elk  [facility] Pager [phone]. If 7PM-7AM, please contact night-coverage at [email or web address], password [facility] 04/19/2015, 10:51 AM  LOS: 2 days

## 2015-04-19 NOTE — [place] FL2 (Signed)
[place] MEDICAID FL2 LEVEL OF CARE SCREENING TOOL     IDENTIFICATION  Patient Name: Jacqueline [name] Birthdate: 06/24/1916 Sex: female Admission Date (Current Location): 04/16/2015  [facility] [place]  Haynes Bast([place])  [id number] A  SSN: [id number] [facility](TRICARE/TRICARE FOR LIFE-  [id number]) Facility and Address:  [facility] [place], [address], [place], [place] [address]      Provider Number: [id number]  Attending Physician Name and Address:  Marinda ElkAbraham Feliz Ortiz, MD  Relative Name and Phone Number:   Harmon Dun(John Bourbeau, son- [phone]    Current Level of Care: Hospital Recommended Level of Care: Skilled Nursing Facility Prior Approval Number:    Date Approved/Denied:   PASRR Number:   [id number]   Discharge Plan: SNF    Current Diagnoses: Patient Active Problem List   Diagnosis Date Noted  . Hypoxia 04/17/2015  . CHF exacerbation (HCC) 04/17/2015  . Acute on chronic diastolic (congestive) heart failure (HCC)   . Pleural effusion 04/16/2015  . Fall 04/16/2015  . Chronic diastolic CHF (congestive heart failure) (HCC) 11/13/2014  . Chronic atrial fibrillation (HCC) 08/13/2014  . Acute diastolic CHF (congestive heart failure) (HCC) 05/19/2013  . Leukocytosis, unspecified 05/16/2013  . NSTEMI (non-ST elevated myocardial infarction) (HCC) 05/16/2013  . Mild aortic stenosis 08/06/2012  . Hypothyroidism 08/06/2012  . CAD (coronary artery disease)   . Unstable angina (HCC) 07/31/2012  . Atypical chest pain 03/06/2012  . Essential hypertension   . Aortic stenosis     Orientation ACTIVITIES/SOCIAL BLADDER RESPIRATION    Self, Time, Situation, Place  Family supportive Continent    BEHAVIORAL SYMPTOMS/MOOD NEUROLOGICAL BOWEL NUTRITION STATUS      Continent Diet (HEART HEALTHY)  PHYSICIAN VISITS COMMUNICATION OF NEEDS Height & Weight Skin      5\' 3"  (160 cm) 173 lbs. Normal          AMBULATORY STATUS RESPIRATION    Supervision limited         Personal Care Assistance Level of Assistance               Functional Limitations Info                SPECIAL CARE FACTORS FREQUENCY  PT (By licensed PT)     PT Frequency:  (2X/week)             Additional Factors Info  Code Status, Allergies Code Status Info:  (DNR) Allergies Info:  (Aspirin)           Current Medications (04/19/2015): Current Facility-Administered Medications  Medication Dose Route Frequency Provider Last Rate Last Dose  . 0.9 %  sodium chloride infusion  250 mL Intravenous PRN Therisa DoyneAnastassia Doutova, MD      . acetaminophen (TYLENOL) tablet 650 mg  650 mg Oral Q4H PRN Therisa DoyneAnastassia Doutova, MD   650 mg at 04/18/15 2144  . atorvastatin (LIPITOR) tablet 20 mg  20 mg Oral q1800 Therisa DoyneAnastassia Doutova, MD   20 mg at 04/18/15 1800  . carvedilol (COREG) tablet 25 mg  25 mg Oral BID WC Rolan Lipahomas Michael Callahan, NP   25 mg at 04/19/15 1026  . furosemide (LASIX) injection 80 mg  80 mg Intravenous BID Marinda ElkAbraham Feliz Ortiz, MD   80 mg at 04/19/15 1130  . irbesartan (AVAPRO) tablet 300 mg  300 mg Oral Daily Marinda ElkAbraham Feliz Ortiz, MD   300 mg at 04/19/15 1027  . levothyroxine (SYNTHROID, LEVOTHROID) tablet 112 mcg  112 mcg Oral QAC breakfast Therisa DoyneAnastassia Doutova, MD   112  mcg at 04/19/15 0552  . ondansetron (ZOFRAN) injection 4 mg  4 mg Intravenous Q6H PRN Therisa Doyne, MD      . potassium chloride SA (K-DUR,KLOR-CON) CR tablet 20 mEq  20 mEq Oral BID Therisa Doyne, MD   20 mEq at 04/19/15 1027  . sodium chloride 0.9 % injection 3 mL  3 mL Intravenous Q12H Therisa Doyne, MD   3 mL at 04/19/15 1028  . sodium chloride 0.9 % injection 3 mL  3 mL Intravenous PRN Therisa Doyne, MD       Do not use this list as official medication orders. Please verify with discharge summary.  Discharge Medications:   Medication List    ASK your doctor about these medications        acetaminophen 325 MG tablet  Commonly known as:  TYLENOL  Take 650 mg by mouth  every 6 (six) hours as needed for pain.     atorvastatin 20 MG tablet  Commonly known as:  LIPITOR  Take 20 mg by mouth daily at 6 PM.     carvedilol 25 MG tablet  Commonly known as:  COREG  TAKE 1 TABLET TWICE A DAY WITH MEALS     ELIQUIS 2.5 MG Tabs tablet  Generic drug:  apixaban  TAKE 1 TABLET TWICE A DAY     furosemide 40 MG tablet  Commonly known as:  LASIX  Take 1 tablet (40 mg total) by mouth daily.     levothyroxine 112 MCG tablet  Commonly known as:  SYNTHROID, LEVOTHROID  Take 112 mcg by mouth daily before breakfast.     nitroGLYCERIN 0.4 MG SL tablet  Commonly known as:  NITROSTAT  Place 0.4 mg under the tongue every 5 (five) minutes as needed for chest pain.     olmesartan 20 MG tablet  Commonly known as:  BENICAR  Take 20 mg by mouth daily.     potassium chloride SA 20 MEQ tablet  Commonly known as:  K-DUR,KLOR-CON  Take 20 mEq by mouth 2 (two) times daily.        Relevant Imaging Results:  Relevant Lab Results:  Recent Labs    Additional Information    Orson Gear, Student-SW [phone]

## 2015-04-20 DIAGNOSIS — W19XXXA Unspecified fall, initial encounter: Secondary | ICD-10-CM

## 2015-04-20 LAB — BASIC METABOLIC PANEL
Anion gap: 8 (ref 5–15)
BUN: 25 mg/dL — ABNORMAL HIGH (ref 6–20)
CALCIUM: 8.8 mg/dL — AB (ref 8.9–10.3)
CHLORIDE: 103 mmol/L (ref 101–111)
CO2: 30 mmol/L (ref 22–32)
CREATININE: 1.12 mg/dL — AB (ref 0.44–1.00)
GFR, EST AFRICAN AMERICAN: 47 mL/min — AB (ref >60)
GFR, EST NON AFRICAN AMERICAN: 40 mL/min — AB (ref >60)
Glucose, Bld: 112 mg/dL — ABNORMAL HIGH (ref 65–99)
Potassium: 3.3 mmol/L — ABNORMAL LOW (ref 3.5–5.1)
SODIUM: 141 mmol/L (ref 135–145)

## 2015-04-20 MED ORDER — POTASSIUM CHLORIDE CRYS ER 20 MEQ PO TBCR
20.0000 meq | EXTENDED_RELEASE_TABLET | Freq: Two times a day (BID) | ORAL | Status: DC
  Administered 2015-04-21: 20 meq via ORAL
  Filled 2015-04-20: qty 1

## 2015-04-20 MED ORDER — FUROSEMIDE 40 MG PO TABS
40.0000 mg | ORAL_TABLET | Freq: Every day | ORAL | Status: DC
  Administered 2015-04-20 – 2015-04-21 (×2): 40 mg via ORAL
  Filled 2015-04-20 (×2): qty 1

## 2015-04-20 MED ORDER — POTASSIUM CHLORIDE CRYS ER 20 MEQ PO TBCR
40.0000 meq | EXTENDED_RELEASE_TABLET | Freq: Two times a day (BID) | ORAL | Status: AC
  Administered 2015-04-20 (×2): 40 meq via ORAL
  Filled 2015-04-20 (×2): qty 2

## 2015-04-20 MED ORDER — LORAZEPAM 0.5 MG PO TABS
0.2500 mg | ORAL_TABLET | Freq: Once | ORAL | Status: AC
  Administered 2015-04-20: 0.25 mg via ORAL
  Filled 2015-04-20: qty 1

## 2015-04-20 MED ORDER — FUROSEMIDE 10 MG/ML IJ SOLN: 60.0000 mg | Freq: Two times a day (BID) | INTRAMUSCULAR | Status: DC

## 2015-04-20 NOTE — Progress Notes (Signed)
Patient has increased anxiety and agitation as the shift progresses. Patient has had little to no sleep over the past couple nights d/t confusion and diuretics/urinating throughout the night. MD on call paged and something for sleep or anxiety has been requested. Will continue to monitor. Troy SineWalker, Chantry Headen M

## 2015-04-20 NOTE — Clinical Social Work Note (Signed)
Clinical Social Work Assessment  Patient Details  Name: Jacqueline [name] MRN: [id number] Date of Birth: 01/11/1917  Date of referral:  04/19/15               Reason for consult:  Facility Placement (Return to [facility]- Increased level of care)                Permission sought to share information with:  Facility Medical sales representative, Family Supports Permission granted to share information::  Yes, Verbal Permission Granted  Name::     [employer]; family as needed  Agency::     Relationship::     Contact Information:     Housing/Transportation Living arrangements for the past 2 months:  Independent Dealer of Information:  Patient Patient Interpreter Needed:  None Criminal Activity/Legal Involvement Pertinent to Current Situation/Hospitalization:  No - Comment as needed Significant Relationships:  Adult Children Lives with:  Facility Resident ([facility] at [facility]) Do you feel safe going back to the place where you live?  Yes (wants to return to her apartment but feels she would benefit from short term SNF first) Need for family participation in patient care:  No (Coment) (Patient verbalizes a desire to remain independent but keeps her family notified of d/c and care issues)  Care giving concerns: "I fell at home 2 times and after the 2nd time I couldn't walk. They say I had a lot of fluid on my heart."   Social Worker assessment / plan: 81 year old female- resident of [facility] who is alert and oriented all spheres. She is very active and is quite proud of her ability to be able to live alone. She has sought rehab several times in the past due to medical conditions but states she was always able to return to her apartment. That is her wish with this hospitalization as she agrees to PT recommendation for short term SNF. Fl2 initiated and placed in chart for MD's signature. Patient declined offer for CSW to keep her son or  daughter informed as to her d/c plan. 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   Plan d/c to the SNF unit when medically stable.  CSW left message for admissions at [facility] re: increased level of care to SNF.     Employment status:  Retired Health and safety inspector:  Medicare PT Recommendations:   SNF Information / Referral to community resources:  Skilled Nursing Facility  Patient/Family's Response to care: Patient indicated that she is feeling better today. "They have taken over 1 liter of fluid off me!"  She admits that she still feels weak but is improving.   Patient/Family's Understanding of and Emotional Response to Diagnosis, Current Treatment, and Prognosis:  Patient states that she is in full control of all of her health care decisions and feels that she is kept informed by her doctors as to her medical condition, treatment and prognosis. However, she seems to have a fairly limited understanding of managing her Heart Failure issues when at home.  She does feels that she can trust the nurses at the facility to answer questions/concerns as needed.     Emotional Assessment Appearance:  Appears younger than stated age Attitude/Demeanor/Rapport:   (Rational, calm, engaged, mentally active) Affect (typically observed):  Calm, Pleasant (friendly, happy) Orientation:  Oriented to Self, Oriented to Place, Oriented to  Time, Oriented to Situation Alcohol / Substance use:  Never Used Psych involvement (  Current and /or in the community):  No (Comment)  Discharge Needs  Concerns to be addressed:  Care Coordination Readmission within the last 30 days:  No Current discharge risk:  None Barriers to Discharge:  No Barriers Identified   Toy BakerCrowder, Rini Moffit T, LCSW 04/19/2015  3:30 PM

## 2015-04-20 NOTE — Progress Notes (Signed)
Utilization review completed. Reise Gladney, RN, BSN. 

## 2015-04-20 NOTE — Progress Notes (Addendum)
[facility] PROGRESS NOTE Interim History: 81 year old with past history of hypertension and mild aortic stenosis chronic atrial fibrillation on Coumadin that filled at [facility] not loses consciousness, workup revealed a pleural effusion denies any cough or fevers. Responding well to IV diuresis. Filed Weights   04/18/15 0442 04/19/15 0404 04/20/15 0537  Weight: 78.881 kg (173 lb 14.4 oz) 78.472 kg (173 lb) 75.615 kg (166 lb 11.2 oz)        Intake/Output Summary (Last 24 hours) at 04/20/15 1201 Last data filed at 04/20/15 5170  Gross per 24 hour  Intake    790 ml  Output   4352 ml  Net  -3562 ml     Assessment/Plan: Acute on chronic diastolic congestive heart failure ([place]): - Change her lasix to oral, recheck a b-met in am. She relates her breathing cont to improve. - She almost 5 L neagtive - Status post thoracocentesis on 04/17/2015. - Continue strict I's and O's daily weight monitoring electrolytes and creatinine. - Cont. ARB. Restrict her fluids. Apply TED hose. - Echocardiogram showed no diastolic dysfunction with mild aortic stenosis, I still think she has acute decompensated diastolic heart failure.  Fall: - C-spine shows no fracture. - PT OT consult will probably due to acute decompensated heart failure. - pt recommended SNF, SW consulted.  Chronic atrial fibrillation ([place]): - Continue beta blockers for rate control, not a candidate due to frequent falls.  Essential hypertension: - Blood pressure at goal.  Right Pleural effusion: - Lites criteria pending likely transudate   Hypokalemia: - replete orally.  Code Status: DNR/DNI Family Communication: none  Disposition Plan: inpatient   Consultants:  none  Procedures: ECHO: Systolic function was normal. The estimated ejection fraction was in the range of 60% to 65%. Wall motion was normal; there were no regional wall motion abnormalities. - Aortic valve: Moderately calcified annulus.  Trileaflet. Mild diffuse thickening and calcification. There was very mild stenosis. There was mild regurgitation. Valve area (VTI): 1.72 cm^2. Valve area (Vmax): 1.43 cm^2. Valve area (Vmean): 1.41 cm^2.  Mitral valve: Calcified annulus. There was mild regurgitation. Left atrium: The atrium was moderately dilated. Right atrium: The atrium was moderately to severely dilated. Pulmonary arteries: PA peak pressure: 57 mm Hg.  Antibiotics:  None  HPI/Subjective: No shortness of breath with ambulation. She will discuss with family wheather she agreed to go to skilled nursing facility  Objective: Filed Vitals:   04/19/15 2206 04/20/15 0537 04/20/15 0659 04/20/15 0944  BP: 152/68  171/71 97/45  Pulse: 71  63 60  Temp: 97.9 F (36.6 C)  97.9 F (36.6 C)   TempSrc: Oral  Oral   Resp: 18  18   Height:      Weight:  75.615 kg (166 lb 11.2 oz)    SpO2: 97%  93%      Exam:  General: Alert, awake, oriented x3, in no acute distress.  HEENT: No bruits, no goiter.  Heart: Regular rate and rhythm, -JVD Lungs: Good air movement, clear to auscultation Abdomen: Soft, nontender, nondistended, positive bowel sounds.  Data Reviewed: Basic Metabolic Panel:  Recent Labs Lab 04/16/15 1802 04/17/15 0244 04/18/15 0300 04/19/15 0414 04/20/15 0324  NA 140 142 139 139 141  K 3.8 3.5 3.8 4.0 3.3*  CL 106 104 104 103 103  CO2 _0 GLUCOSE 119* 124* 98 104* 112*  BUN 22* 22* 21* 26* 25*  CREATININE 1.08* 1.15* 1.15* 1.17* 1.12*  CALCIUM 9.3 9.2 9.0  8.9 8.8*   Liver Function Tests:  Recent Labs Lab 04/16/15 1802 04/17/15 0244  AST 25 27  ALT 20 18  ALKPHOS 108 95  BILITOT 1.3* 1.6*  PROT 6.5 6.2*  ALBUMIN 3.9 3.7   No results for input(s): LIPASE, AMYLASE in the last 168 hours. No results for input(s): AMMONIA in the last 168 hours. CBC:  Recent Labs Lab 04/16/15 1802 04/17/15 0244  WBC 10.7* 8.6  NEUTROABS 9.0* 6.9  HGB 14.4 14.4  HCT 43.3 43.0  MCV 90.0 90.5  PLT  127* 130*   Cardiac Enzymes:  Recent Labs Lab 04/17/15 0244 04/17/15 0803 04/17/15 1500  TROPONINI 0.09* 0.10* 0.10*   BNP (last 3 results)  Recent Labs  04/16/15 1821  BNP 278.6*    ProBNP (last 3 results) No results for input(s): PROBNP in the last 8760 hours.  CBG: No results for input(s): GLUCAP in the last 168 hours.  Recent Results (from the past 240 hour(s))  MRSA PCR Screening     Status: None   Collection Time: 04/17/15  2:29 AM  Result Value Ref Range Status   MRSA by PCR NEGATIVE NEGATIVE Final    Comment:        The GeneXpert MRSA Assay (FDA approved for NASAL specimens only), is one component of a comprehensive MRSA colonization surveillance program. It is not intended to diagnose MRSA infection nor to guide or monitor treatment for MRSA infections.   Culture, body fluid-bottle     Status: None (Preliminary result)   Collection Time: 04/17/15  1:11 PM  Result Value Ref Range Status   Specimen Description FLUID RIGHT PLEURAL  Final   Special Requests BAA 10CCS  Final   Culture NO GROWTH 2 DAYS  Final   Report Status PENDING  Incomplete  Gram stain     Status: None   Collection Time: 04/17/15  1:11 PM  Result Value Ref Range Status   Specimen Description FLUID RIGHT PLEURAL  Final   Special Requests NONE  Final   Gram Stain   Final    MODERATE WBC PRESENT,BOTH PMN AND MONONUCLEAR NO ORGANISMS SEEN    Report Status 04/17/2015 FINAL  Final     Studies: No results found.  Scheduled Meds: . atorvastatin  20 mg Oral q1800  . carvedilol  25 mg Oral BID WC  . furosemide  80 mg Intravenous BID  . irbesartan  300 mg Oral Daily  . levothyroxine  112 mcg Oral QAC breakfast  . [START ON 04/21/2015] potassium chloride SA  20 mEq Oral BID  . potassium chloride  40 mEq Oral BID  . sodium chloride  3 mL Intravenous Q12H   Continuous Infusions:    Charlynne Cousins  [facility] Pager [phone]. If 7PM-7AM, please contact night-coverage  at [email or web address], password [facility] 04/20/2015, 12:01 PM  LOS: 3 days

## 2015-04-20 NOTE — Progress Notes (Signed)
CSW spoke with Jacqueline [name], [facility]. She indicated that she will have a SNF bed ready for patient when d/c'd from the hospital  Per MD- hopefully will be stable tomorrow for d/c.  Lorri Frederickonna T. Jaci LazierCrowder, [place] [phone]

## 2015-04-20 NOTE — Progress Notes (Signed)
Physical Therapy Treatment Patient Details Name: Jacqueline [name] MRN: [id number] DOB: [birth year] Today's Date: 04/20/2015    History of Present Illness Patient is a 81 y/o female presents with hypoxia and s/p fall at home  CXR-showed right lower lobe process combination of effusion and atelectasis versus infiltrate. s/p thoracentesis. PMH includes CAD s/p cath, HTN, mild aortic stenosis, NSTEMI, DM, CHF, A-fib.    PT Comments    Pt remains confused with generalized weakness and deconditioning. Pt con't to require 24/7 assist for safe mobility. Pt with improved ambulation tolerance this date. Con't to recommend SNF upon d/c,.  Follow Up Recommendations  SNF     Equipment Recommendations  None recommended by PT    Recommendations for Other Services       Precautions / Restrictions Precautions Precautions: Fall Restrictions Weight Bearing Restrictions: No    Mobility  Bed Mobility               General bed mobility comments: pt sitting EOB  Transfers Overall transfer level: Needs assistance Equipment used: Rolling walker (2 wheeled) Transfers: Sit to/from [employer] Sit to Stand: Min assist;Mod assist Stand pivot transfers: Min assist       General transfer comment: minA to power up, shuffled slow guarded stepping to [facility]  Ambulation/Gait Ambulation/Gait assistance: Min guard Ambulation Distance (Feet): 75 Feet Assistive device: Rolling walker (2 wheeled) Gait Pattern/deviations: Step-through pattern;Decreased stride length (decreased step height) Gait velocity: decreased   General Gait Details: slow and guarded, progressive stability with increased distance however remains to require minA for turning   Stairs            Wheelchair Mobility    Modified Rankin (Stroke Patients Only)       Balance Overall balance assessment: Needs assistance Sitting-balance support: Feet supported Sitting balance-Leahy Scale: Good Sitting balance  - Comments: attempted to don socks in sitting positiong but difficult   Standing balance support: No upper extremity supported Standing balance-Leahy Scale: Fair Standing balance comment: pt able to stand and talk on phone for 5 min                    Cognition Arousal/Alertness: Awake/alert Behavior During Therapy: [facility] for tasks assessed/performed Overall Cognitive Status: History of cognitive impairments - at baseline (confused, thought it was Sunday)                      Exercises      General Comments General comments (skin integrity, edema, etc.): assisted pt to [facility], assist for thorough hygiene      Pertinent Vitals/Pain Pain Assessment: No/denies pain    Home Living Family/patient expects to be discharged to:: Skilled nursing facility               Additional Comments: lives at [facility] in independent living apt.     Prior Function Level of Independence: Independent with assistive device(s)      Comments: Uses scooter for going to the dining hall and rollator for household distances. Reports multiple falls.    PT Goals (current goals can now be found in the care plan section) Progress towards PT goals: Progressing toward goals    Frequency  Min 2X/week    PT Plan Current plan remains appropriate    Co-evaluation             End of Session Equipment Utilized During Treatment: Gait belt Activity Tolerance: Patient limited by fatigue Patient left: in  chair;with call bell/phone within reach;with chair alarm set     Time: [id number] PT Time Calculation (min) (ACUTE ONLY): 32 min  Charges:  $Gait Training: 8-22 mins $Therapeutic Activity: 8-22 mins                    G Codes:      Marcene BrawnChadwell, Raeanna Soberanes Marie 04/20/2015, 9:58 AM   Lewis ShockAshly Naika Noto, PT, DPT Pager #: [phone] Office #: [phone]

## 2015-04-20 NOTE — Care Management Important Message (Signed)
Important Message  Patient Details  Name: Jacqueline [name] MRN: [id number] Date of Birth: 02/14/1917   Medicare Important Message Given:  Yes    Tashea Othman P Daaiel Starlin 04/20/2015, 12:03 PM

## 2015-04-21 ENCOUNTER — Ambulatory Visit: Payer: Medicare Other | Admitting: Podiatry

## 2015-04-21 DIAGNOSIS — I10 Essential (primary) hypertension: Secondary | ICD-10-CM

## 2015-04-21 DIAGNOSIS — E042 Nontoxic multinodular goiter: Secondary | ICD-10-CM

## 2015-04-21 DIAGNOSIS — I350 Nonrheumatic aortic (valve) stenosis: Secondary | ICD-10-CM

## 2015-04-21 DIAGNOSIS — I5033 Acute on chronic diastolic (congestive) heart failure: Secondary | ICD-10-CM

## 2015-04-21 DIAGNOSIS — J90 Pleural effusion, not elsewhere classified: Secondary | ICD-10-CM

## 2015-04-21 DIAGNOSIS — D696 Thrombocytopenia, unspecified: Secondary | ICD-10-CM

## 2015-04-21 DIAGNOSIS — W19XXXD Unspecified fall, subsequent encounter: Secondary | ICD-10-CM

## 2015-04-21 DIAGNOSIS — I272 Other secondary pulmonary hypertension: Secondary | ICD-10-CM

## 2015-04-21 DIAGNOSIS — I482 Chronic atrial fibrillation: Secondary | ICD-10-CM

## 2015-04-21 DIAGNOSIS — J9601 Acute respiratory failure with hypoxia: Secondary | ICD-10-CM

## 2015-04-21 NOTE — Discharge Summary (Signed)
Physician Discharge Summary  Jacqueline [name] KGM:[id number] DOB: 1916/11/01 DOA: 04/16/2015  PCP: Pamelia Hoit, MD  Admit date: 04/16/2015 Discharge date: 04/21/2015  Time spent: 60 minutes  Recommendations for Outpatient Follow-up:  1. Daily weights 2. Basic metabolic panel in 4 days 3. Pulse ox every other day  4. If overall physical condition improved with physical therapy, can reconsider starting anticoagulation for A. fib  Discharge Condition: Stable  Discharge Diagnoses:  Principal Problem:   Acute on chronic diastolic (congestive) heart failure (HCC) Active Problems:   Essential hypertension   Mild aortic stenosis   Chronic atrial fibrillation (HCC)   Pleural effusion   Fall   Acute respiratory failure (HCC)   Moderate pulmonary hypertension (HCC)   Goiter, nontoxic, multinodular   Thrombocytopenia (HCC)   History of present illness:  Is a 81 year old female with a history of hypertension, mild aortic stenosis, chronic atrial fibrillation on Coumadin, coronary artery disease, gout, hypothyroidism, chronic diastolic heart failure who presented to the hospital from her nursing home after a fall and was noted to be hypoxic. Workup in the ER revealed that her oxygen levels were in the mid 80s. Chest x-ray revealed a moderate sized right lower lobe effusion with possible underlying atelectasis or infiltrate and a small left pleural effusion. She was also noted to have left upper lobe bronchial obstruction with segmental collapse. She admitted to having shortness of breath with exertion. She was admitted for treatment of her acute hypoxic respiratory failure.  Hospital Course:  Acute hypoxic respiratory failure- Bilateral pleural effusions (right greater than left)/acute on chronic diastolic heart failure -She has been diuresed with IV Lasix and is in negative balance by about 4.5 L -She underwent a right-sided thoracentesis on 04/17/15-fluid noted to be transudative - She  has been weaned off of oxygen and is currently 97% on room air-no complaints of dyspnea at rest or with exertion -2-D echo reveals an EF of 60-65%, mild aortic stenosis with a moderately calcified annulus, moderately dilated left atrium and moderate to severely dilated right atrium-PA peak pressure consistent with moderate pulmonary hypertension-although there is no direct mention of diastolic heart failure, suspect that she does have chronic diastolic dysfunction -She is on her is a moderate 40 mg daily which we will continue for now-continue Olmesartan and carvedilol as well -will be returning to SNF - will ask staff to monitor daily weights-recommended intermittently following pulse ox as well  Fall -Likely secondary to hypoxia-PT and OT has evaluated her-she will be transitioned back to SNF and receive further physical therapy  Chronic atrial fibrillation -Continue Coreg-not a candidate for anticoagulation due to falls  Essential hypertension -Continue Coreg and ARB  Hypokalemia Replace-Recheck in 4 days at SNF  Multinodular goiter - noted on imaging- stable when compared with past imaging reports  Chronic thrombocytopenia  Procedures:  2-D echo  Consultations:  None  Discharge Exam: Filed Weights   04/19/15 0404 04/20/15 0537 04/21/15 0616  Weight: 78.472 kg (173 lb) 75.615 kg (166 lb 11.2 oz) 75.8 kg (167 lb 1.7 oz)   Filed Vitals:   04/21/15 1231  BP: 87/53  Pulse: 58  Temp: 97.1 F (36.2 C)  Resp: 18    General: AAO x 3, no distress Cardiovascular: Irregularly irregular rate and rhythm Respiratory: clear to auscultation bilaterally GI: soft, non-tender, non-distended, bowel sound positive  Discharge Instructions You were cared for by a hospitalist during your hospital stay. If you have any questions about your discharge medications or the care you received  while you were in the hospital after you are discharged, you can call the unit and asked to speak with  the hospitalist on call if the hospitalist that took care of you is not available. Once you are discharged, your primary care physician will handle any further medical issues. Please note that NO REFILLS for any discharge medications will be authorized once you are discharged, as it is imperative that you return to your primary care physician (or establish a relationship with a primary care physician if you do not have one) for your aftercare needs so that they can reassess your need for medications and monitor your lab values.      Discharge Instructions    Diet - low sodium heart healthy    Complete by:  As directed      Increase activity slowly    Complete by:  As directed             Medication List    STOP taking these medications        ELIQUIS 2.5 MG Tabs tablet  Generic drug:  apixaban      TAKE these medications        acetaminophen 325 MG tablet  Commonly known as:  TYLENOL  Take 650 mg by mouth every 6 (six) hours as needed for pain.     atorvastatin 20 MG tablet  Commonly known as:  LIPITOR  Take 20 mg by mouth daily at 6 PM.     carvedilol 25 MG tablet  Commonly known as:  COREG  TAKE 1 TABLET TWICE A DAY WITH MEALS     furosemide 40 MG tablet  Commonly known as:  LASIX  Take 1 tablet (40 mg total) by mouth daily.     levothyroxine 112 MCG tablet  Commonly known as:  SYNTHROID, LEVOTHROID  Take 112 mcg by mouth daily before breakfast.     nitroGLYCERIN 0.4 MG SL tablet  Commonly known as:  NITROSTAT  Place 0.4 mg under the tongue every 5 (five) minutes as needed for chest pain.     olmesartan 20 MG tablet  Commonly known as:  BENICAR  Take 20 mg by mouth daily.     potassium chloride SA 20 MEQ tablet  Commonly known as:  K-DUR,KLOR-CON  Take 20 mEq by mouth 2 (two) times daily.       Allergies  Allergen Reactions  . Aspirin     Pt states she cannot have aspirin due to Eliquis   Follow-up Information    Follow up with Pamelia Hoit, MD On  05/03/2015.   Specialty:  Family Medicine   Why:  @  10:10am   ... confirmed w/ Alanda Amass information:   [address] [place] [address] [phone]        The results of significant diagnostics from this hospitalization (including imaging, microbiology, ancillary and laboratory) are listed below for reference.    Significant Diagnostic Studies: Dg Chest 1 View  04/17/2015  CLINICAL DATA:  Status post right thoracentesis.  No pain. EXAM: CHEST 1 VIEW COMPARISON:  04/16/2015 FINDINGS: There is decreased pleural fluid on the right following thoracentesis. No pneumothorax. Stable bilateral interstitial thickening. Cardiac silhouette is mildly enlarged. No mediastinal or hilar masses. IMPRESSION: 1. Decreased right pleural effusion following thoracentesis. No pneumothorax. No other change. Electronically Signed   By: Amie Portland M.D.   On: 04/17/2015 13:19   Dg Chest 2 View  04/16/2015  CLINICAL DATA:  Larey Seat  today at nursing home.  Chest pain. EXAM: CHEST  2 VIEW COMPARISON:  05/16/2013 FINDINGS: The heart is enlarged but stable. There is tortuosity and calcification of the thoracic aorta. Right lower lobe process is a combination of elevated right hemidiaphragm, pleural effusion and atelectasis or infiltrate. Mild vascular congestion and slightly thickened interstitial lines suggesting mild interstitial edema. The bony thorax is grossly intact. Stable exaggerated thoracic kyphosis but no acute fracture. IMPRESSION: Stable cardiac enlargement and chronic lung changes. Right lower lobe process likely a combination of effusion and atelectasis or infiltrate. Vascular congestion and probable mild interstitial edema. Electronically Signed   By: Rudie Meyer M.D.   On: 04/16/2015 18:11   Ct Head Wo Contrast  04/16/2015  CLINICAL DATA:  Patient to ED per EMS [facility] post fall. Patient states that she tripped over her feet and fell backwards. Denies dizziness. Lac to back  of head. EXAM: CT HEAD WITHOUT CONTRAST CT CERVICAL SPINE WITHOUT CONTRAST TECHNIQUE: Multidetector CT imaging of the head and cervical spine was performed following the standard protocol without intravenous contrast. Multiplanar CT image reconstructions of the cervical spine were also generated. COMPARISON:  03/30/2010 FINDINGS: CT HEAD FINDINGS There is central and cortical atrophy. Periventricular white matter changes are consistent with small vessel disease. Old lacunar infarct is identified within the right basal ganglia, stable in appearance. There is no intra or extra-axial fluid collection or mass lesion. The basilar cisterns and ventricles have a normal appearance. There is no CT evidence for acute infarction or hemorrhage. Left posterior parietal scalp edema/hematoma noted. Bone windows show no acute calvarial injury. There is atherosclerotic calcification of the internal carotid arteries. Paranasal and mastoid air cells are normally aerated. CT CERVICAL SPINE FINDINGS Enlarged right thyroid lobe with low-attenuation lesion and rim calcified lesion appears stable since previous CT of the cervical spine in 2011. There is moderate degenerative change in the midcervical spine, most notable at C5-6 and C6-7. No acute fracture or traumatic subluxation. There is a right pleural effusion and left atelectasis or effusion no warranting further evaluation with chest x-ray. IMPRESSION: 1. Atrophy and small vessel disease. 2.  No evidence for acute intracranial abnormality. 3. Left parietal scalp edema. 4.  No evidence for acute cervical spine abnormality. 5. Stable probable goiter of the thyroid gland. 6. Moderate right pleural effusion suspected. Chest x-ray is recommended. Electronically Signed   By: Norva Pavlov M.D.   On: 04/16/2015 16:40   Ct Chest W Contrast  04/17/2015  CLINICAL DATA:  Shortness breath and weakness for several days. EXAM: CT CHEST WITH CONTRAST TECHNIQUE: Multidetector CT imaging of  the chest was performed during intravenous contrast administration. CONTRAST:  75mL OMNIPAQUE IOHEXOL 300 MG/ML  SOLN COMPARISON:  None. FINDINGS: THORACIC INLET/BODY WALL: Multi nodular goiter, grossly stable from cervical spine CT in 2011. MEDIASTINUM: Mild biatrial enlargement. No pericardial effusion. Diffuse atherosclerosis, including the coronary arteries. LAD stenting. No acute aortic findings. There is chronic calcified granulomatous changes to the bilateral hila. LUNG WINDOWS: Posterior segment left upper lobe bandlike opacity with occluded bronchi. No discrete mass lesion is seen. Small left and moderate right pleural effusion with atelectasis in the lower lobes. No loculation or complexity to the effusions. There are calcified pulmonary nodules. No suspicious noncalcified nodules. UPPER ABDOMEN: No acute finding.  Granulomatous changes in the spleen. OSSEOUS: No acute fracture. No suspicious lytic or blastic lesions. Exaggerated thoracic kyphosis. Osteopenia. IMPRESSION: 1. Moderate right and small left layering pleural effusions with dependent atelectasis. 2. Obstructed  left upper lobe posterior segment bronchus with segmental collapse. Given the atelectasis was seen on 2011 cervical spine CT, this is likely post infectious/inflammatory scarring. 3. Additional chronic findings are described above. Electronically Signed   By: Marnee SpringJonathon  Watts M.D.   On: 04/17/2015 04:31   Ct Cervical Spine Wo Contrast  04/16/2015  CLINICAL DATA:  [facility]. Patient states that she tripped over her feet and fell backwards. Denies dizziness. Lac to back of head. EXAM: CT HEAD WITHOUT CONTRAST CT CERVICAL SPINE WITHOUT CONTRAST TECHNIQUE: Multidetector CT imaging of the head and cervical spine was performed following the standard protocol without intravenous contrast. Multiplanar CT image reconstructions of the cervical spine were also generated. COMPARISON:  03/30/2010  FINDINGS: CT HEAD FINDINGS There is central and cortical atrophy. Periventricular white matter changes are consistent with small vessel disease. Old lacunar infarct is identified within the right basal ganglia, stable in appearance. There is no intra or extra-axial fluid collection or mass lesion. The basilar cisterns and ventricles have a normal appearance. There is no CT evidence for acute infarction or hemorrhage. Left posterior parietal scalp edema/hematoma noted. Bone windows show no acute calvarial injury. There is atherosclerotic calcification of the internal carotid arteries. Paranasal and mastoid air cells are normally aerated. CT CERVICAL SPINE FINDINGS Enlarged right thyroid lobe with low-attenuation lesion and rim calcified lesion appears stable since previous CT of the cervical spine in 2011. There is moderate degenerative change in the midcervical spine, most notable at C5-6 and C6-7. No acute fracture or traumatic subluxation. There is a right pleural effusion and left atelectasis or effusion no warranting further evaluation with chest x-ray. IMPRESSION: 1. Atrophy and small vessel disease. 2.  No evidence for acute intracranial abnormality. 3. Left parietal scalp edema. 4.  No evidence for acute cervical spine abnormality. 5. Stable probable goiter of the thyroid gland. 6. Moderate right pleural effusion suspected. Chest x-ray is recommended. Electronically Signed   By: Norva PavlovElizabeth  Brown M.D.   On: 04/16/2015 16:40   [place] Thoracentesis Asp Pleural Space W/img Guide  04/17/2015  INDICATION: CHF, dyspnea, bilateral pleural effusions, right greater than left. Request made for diagnostic and therapeutic right thoracentesis. EXAM: ULTRASOUND GUIDED DIAGNOSTIC AND THERAPEUTIC RIGHT THORACENTESIS COMPARISON:  None. MEDICATIONS: None COMPLICATIONS: None immediate TECHNIQUE: Informed written consent was obtained from the patient after a discussion of the risks, benefits and alternatives to treatment. A  timeout was performed prior to the initiation of the procedure. Initial ultrasound scanning demonstrates a moderate-to-large right pleural effusion. The lower chest was prepped and draped in the usual sterile fashion. 1% lidocaine was used for local anesthesia. An ultrasound image was saved for documentation purposes. A 6 Fr Safe-T-Centesis catheter was introduced. The thoracentesis was performed. The catheter was removed and a dressing was applied. The patient tolerated the procedure well without immediate post procedural complication. The patient was escorted to have an upright chest radiograph. FINDINGS: A total of approximately 950 cc of clear, yellow fluid was removed. Requested samples were sent to the laboratory. IMPRESSION: Successful ultrasound-guided diagnostic and therapeutic right sided thoracentesis yielding 950 cc of pleural fluid. Read by: Jeananne RamaKevin Allred, PA-C Electronically Signed   By: Judie PetitM.  Shick M.D.   On: 04/17/2015 13:07    Microbiology: Recent Results (from the past 240 hour(s))  MRSA PCR Screening     Status: None   Collection Time: 04/17/15  2:29 AM  Result Value Ref Range Status   MRSA by PCR NEGATIVE  NEGATIVE Final    Comment:        The GeneXpert MRSA Assay (FDA approved for NASAL specimens only), is one component of a comprehensive MRSA colonization surveillance program. It is not intended to diagnose MRSA infection nor to guide or monitor treatment for MRSA infections.   Culture, body fluid-bottle     Status: None (Preliminary result)   Collection Time: 04/17/15  1:11 PM  Result Value Ref Range Status   Specimen Description FLUID RIGHT PLEURAL  Final   Special Requests BAA 10CCS  Final   Culture NO GROWTH 3 DAYS  Final   Report Status PENDING  Incomplete  Gram stain     Status: None   Collection Time: 04/17/15  1:11 PM  Result Value Ref Range Status   Specimen Description FLUID RIGHT PLEURAL  Final   Special Requests NONE  Final   Gram Stain   Final     MODERATE WBC PRESENT,BOTH PMN AND MONONUCLEAR NO ORGANISMS SEEN    Report Status 04/17/2015 FINAL  Final     Labs: Basic Metabolic Panel:  Recent Labs Lab 04/16/15 1802 04/17/15 0244 04/18/15 0300 04/19/15 0414 04/20/15 0324  NA 140 142 139 139 141  K 3.8 3.5 3.8 4.0 3.3*  CL 106 104 104 103 103  CO2 GLUCOSE 119* 124* 98 104* 112*  BUN 22* 22* 21* 26* 25*  CREATININE 1.08* 1.15* 1.15* 1.17* 1.12*  CALCIUM 9.3 9.2 9.0 8.9 8.8*   Liver Function Tests:  Recent Labs Lab 04/16/15 1802 04/17/15 0244  AST 25 27  ALT 20 18  ALKPHOS 108 95  BILITOT 1.3* 1.6*  PROT 6.5 6.2*  ALBUMIN 3.9 3.7   No results for input(s): LIPASE, AMYLASE in the last 168 hours. No results for input(s): AMMONIA in the last 168 hours. CBC:  Recent Labs Lab 04/16/15 1802 04/17/15 0244  WBC 10.7* 8.6  NEUTROABS 9.0* 6.9  HGB 14.4 14.4  HCT 43.3 43.0  MCV 90.0 90.5  PLT 127* 130*   Cardiac Enzymes:  Recent Labs Lab 04/17/15 0244 04/17/15 0803 04/17/15 1500  TROPONINI 0.09* 0.10* 0.10*   BNP: BNP (last 3 results)  Recent Labs  04/16/15 1821  BNP 278.6*    ProBNP (last 3 results) No results for input(s): PROBNP in the last 8760 hours.  CBG: No results for input(s): GLUCAP in the last 168 hours.     SignedCalvert Cantor, MD [facility] 04/21/2015, 12:34 PM

## 2015-04-21 NOTE — Progress Notes (Signed)
[facility] report called to ChelseaJoyce. Tele and tele removed.

## 2015-04-21 NOTE — Progress Notes (Signed)
[facility]  Anticipated discharge date:04/21/15 Family notified:pt to inform family Transportation by [facility]- called at 1:30pm  CSW signing off.  Merlyn LotJenna Holoman, LCSWA Clinical Social Worker [phone]

## 2015-04-22 LAB — CULTURE, BODY FLUID W GRAM STAIN -BOTTLE: Culture: NO GROWTH

## 2015-04-22 LAB — CULTURE, BODY FLUID-BOTTLE

## 2015-05-10 ENCOUNTER — Encounter: Payer: Self-pay | Admitting: Cardiology

## 2015-05-10 ENCOUNTER — Ambulatory Visit (INDEPENDENT_AMBULATORY_CARE_PROVIDER_SITE_OTHER): Payer: Medicare Other | Admitting: Cardiology

## 2015-05-10 VITALS — BP 112/70 | HR 64 | Ht 63.0 in | Wt 178.2 lb

## 2015-05-10 DIAGNOSIS — I25119 Atherosclerotic heart disease of native coronary artery with unspecified angina pectoris: Secondary | ICD-10-CM

## 2015-05-10 DIAGNOSIS — I482 Chronic atrial fibrillation, unspecified: Secondary | ICD-10-CM

## 2015-05-10 DIAGNOSIS — I10 Essential (primary) hypertension: Secondary | ICD-10-CM | POA: Diagnosis not present

## 2015-05-10 DIAGNOSIS — I2510 Atherosclerotic heart disease of native coronary artery without angina pectoris: Secondary | ICD-10-CM

## 2015-05-10 DIAGNOSIS — I5032 Chronic diastolic (congestive) heart failure: Secondary | ICD-10-CM | POA: Diagnosis not present

## 2015-05-10 NOTE — Progress Notes (Signed)
Jacqueline GableMary E [name] Date of Birth: 04/13/1917 Medical Record #[id number]  History of Present Illness: [name] is seen for follow up CHF.Marland Kitchen. She has known CAD with past DES to the LAD in February 2014 with residual disease in small diagnonals and OM - treated medically, HTN, HLD, hypothyroidism and mild AS. She also has chronic atrial fibrillation. In March she was started on Eliquis with dose reduced for age and renal function.   In November she was admitted after a mechanical fall where she hit her head. She was noted to be hypoxic and had CHF with right effusion. She had thoracentesis and was diuresed 4.5 liters. Her losartan was held due to acute on chronic renal insufficiency. Lasix was resumed on DC. DC weight was 167 lbs. [facility] and getting ready to return to her own apartment there. She is seen with her daughter today. She reports she is weighing daily with stable weight of 174 lbs there. Wearing support hose. Finds it hard to get low sodium diet at facility. Eliquis was stopped in hospital due to fall risk. Denies any dyspnea. Has mild edema. No chest pain or palpitations.     Current Outpatient Prescriptions  Medication Sig Dispense Refill  . carvedilol (COREG) 25 MG tablet TAKE 1 TABLET TWICE A DAY WITH MEALS 180 tablet 2  . furosemide (LASIX) 40 MG tablet Take 1 tablet (40 mg total) by mouth daily. 90 tablet 3  . levothyroxine (SYNTHROID, LEVOTHROID) 112 MCG tablet Take 112 mcg by mouth daily before breakfast.    . nitroGLYCERIN (NITROSTAT) 0.4 MG SL tablet Place 0.4 mg under the tongue every 5 (five) minutes as needed for chest pain.    Marland Kitchen. olmesartan (BENICAR) 20 MG tablet Take 20 mg by mouth daily.    . potassium chloride SA (K-DUR,KLOR-CON) 20 MEQ tablet Take 20 mEq by mouth 2 (two) times daily.    Marland Kitchen. acetaminophen (TYLENOL) 325 MG tablet Take 650 mg by mouth every 6 (six) hours as needed for pain.     No current facility-administered medications for this  visit.    Allergies  Allergen Reactions  . Aspirin     Pt states she cannot have aspirin due to Eliquis    Past Medical History  Diagnosis Date  . Hypertension   . Mild aortic stenosis     a. 01/2010 Echo: Nl EF, mild AS/AI, mild LAE, mild PAH.  . Diabetes mellitus   . Gout   . Hypothyroidism   . CAD (coronary artery disease)     a. 2-08/2012: Cath/PCI: LM 10-20d, LAD 60-70p, 3642m (Rotablator->2.5x32 Promus Premier DES), D1 70-80ost, D2- small-90 ost/95p, LCX large, OM1 40p, OM2 small 80-90, RCA dominant 7275m, Ef 65-70%.  . Bilateral leg weakness     chronic  . Hyperlipidemia   . CHF (congestive heart failure) (HCC)   . Shortness of breath     with chf & lying flat  . Atrial fibrillation [facility](HCC)     Past Surgical History  Procedure Laterality Date  . Left knee replacement    . Breast biopsies    . Cataract extraction    . Coronary angioplasty with stent placement  march 2014  . Left heart catheterization with coronary angiogram N/A 08/02/2012    Procedure: LEFT HEART CATHETERIZATION WITH CORONARY ANGIOGRAM;  Surgeon: Harper Smoker M Swazil[place]Jordan, MD;  [facility];  Service: Cardiovascular;  Laterality: N/A;  . Percutaneous coronary rotoblator intervention (pci-r) N/A 08/05/2012  Procedure: PERCUTANEOUS CORONARY ROTOBLATOR INTERVENTION (PCI-R);  Surgeon: Andersen Mckiver M [place], MD;  Location: [facility] CATH LAB;  Service: Cardiovascular;  Laterality: N/A;    History  Smoking status  . Never Smoker   Smokeless tobacco  . Never Used    History  Alcohol Use No    Family History  Problem Relation Age of Onset  . Alzheimer's disease Brother   . Heart disease Father 2    Review of Systems: The review of systems is per the HPI.  All other systems were reviewed and are negative.  Physical Exam: BP 112/70 mmHg  Pulse 64  Ht  (1.6 m)  Wt 80.825 kg (178 lb 3 oz)  BMI 31.57 kg/m2 Patient is very pleasant and in no acute distress. Skin is warm and dry. Color is normal.  HEENT is  unremarkable. Normocephalic/atraumatic. PERRL. Sclera are nonicteric. Neck is supple. No masses. No JVD. Lungs are clear. Cardiac exam shows an irregular rate and rhythm. 2/6 outflow murmur noted. Abdomen is soft. Extremities are with 1+ edema with support hose in place. Gait and ROM are intact. She is using a walker. No gross neurologic deficits noted.  Wt Readings from Last 3 Encounters:  05/10/15 80.825 kg (178 lb 3 oz)  04/21/15 75.8 kg (167 lb 1.7 oz)  11/13/14 82.146 kg (181 lb 1.6 oz)     LABORATORY DATA: PENDING  Lab Results  Component Value Date   WBC 8.6 04/17/2015   HGB 14.4 04/17/2015   HCT 43.0 04/17/2015   PLT 130* 04/17/2015   GLUCOSE 112* 04/20/2015   ALT 18 04/17/2015   AST 27 04/17/2015   NA 141 04/20/2015   K 3.3* 04/20/2015   CL 103 04/20/2015   CREATININE 1.12* 04/20/2015   BUN 25* 04/20/2015   CO2 30 04/20/2015   TSH 0.994 04/17/2015   INR 1.40 04/17/2015   HGBA1C 5.9* 07/31/2012     Assessment / Plan: 1. CAD s/p DES of the LAD February 2014. Clinically doing well. Continue  medical Rx. Back on ASA.  2. Chronic Diastolic HF. Admitted last month with exacerbation. Looks fairly good today with mild edema. Weight down from prior visit here and stable according to patient. Will continue lasix 40 mg daily. If weight increases will need to increase lasix dose. Will call SNF and request low sodium diet. Follow up in 3 months.  3. Atrial fibrillation. Rate is well controlled.. She has a [place] score of 5 and is at high risk of stroke. Eliquis discontinued due to fall risk. I don't know if this outweighs benefit of therapy but will monitor for now on ASA.   4. HTN -  Continue current antihypertensive therapy.  5. Chronic edema. Continue current diuretic dose and support hose.  6. Mild aortic stenosis.  She has lab work scheduled with primary care next week. If renal function is back to baseline I would resume losartan 25 mg daily.  I will see in 3 months.

## 2015-05-10 NOTE — Patient Instructions (Signed)
Continue your current therapy and try and avoid salty food.  I will see you in 3 months.

## 2015-05-12 ENCOUNTER — Other Ambulatory Visit: Payer: Self-pay | Admitting: *Deleted

## 2015-05-12 MED ORDER — NITROGLYCERIN 0.4 MG SL SUBL: 0.4000 mg | SUBLINGUAL_TABLET | SUBLINGUAL | Status: AC | PRN

## 2015-05-13 ENCOUNTER — Telehealth: Payer: Self-pay

## 2015-05-13 NOTE — Telephone Encounter (Signed)
[facility] called spoke to TonyJoy.Advised patient needs to be on a low sodium diet.

## 2015-08-12 ENCOUNTER — Ambulatory Visit (INDEPENDENT_AMBULATORY_CARE_PROVIDER_SITE_OTHER): Payer: Medicare Other | Admitting: Cardiology

## 2015-08-12 ENCOUNTER — Encounter: Payer: Self-pay | Admitting: Cardiology

## 2015-08-12 VITALS — BP 128/68 | HR 72 | Ht 63.0 in | Wt 186.5 lb

## 2015-08-12 DIAGNOSIS — I482 Chronic atrial fibrillation, unspecified: Secondary | ICD-10-CM

## 2015-08-12 DIAGNOSIS — I272 Other secondary pulmonary hypertension: Secondary | ICD-10-CM | POA: Diagnosis not present

## 2015-08-12 DIAGNOSIS — I2510 Atherosclerotic heart disease of native coronary artery without angina pectoris: Secondary | ICD-10-CM | POA: Diagnosis not present

## 2015-08-12 DIAGNOSIS — I5032 Chronic diastolic (congestive) heart failure: Secondary | ICD-10-CM | POA: Diagnosis not present

## 2015-08-12 MED ORDER — ASPIRIN EC 81 MG PO TBEC: 81.0000 mg | DELAYED_RELEASE_TABLET | Freq: Every day | ORAL | Status: AC

## 2015-08-12 NOTE — Progress Notes (Signed)
Jacqueline [name] Date of Birth: 03/11/1917 Medical Record #[id number]  History of Present Illness: Jacqueline [name] is seen for follow up CHF.Marland Kitchen. She has known CAD with past DES to the LAD in February 2014 with residual disease in small diagnonals and OM - treated medically, HTN, HLD, hypothyroidism and mild AS. She also has chronic atrial fibrillation. In March 2016 she was started on Eliquis with dose reduced for age and renal function.   In November she was admitted after a mechanical fall where she hit her head. She was noted to be hypoxic and had CHF with right effusion. She had thoracentesis and was diuresed 4.5 liters. Her losartan was held due to acute on chronic renal insufficiency. Lasix was resumed on DC. Eliquis was stopped due to fall risk and she is on ASA 81 mg daily.  [facility]. On follow up today she is doing well. She has chronic swelling in her legs. She reports this is unchanged and that they go down at night. She is still wearing support hose. She has gained 8 lbs but thinks this is related to eating too much. She denies SOB or chest pain. No orthopnea or PND.     Current Outpatient Prescriptions  Medication Sig Dispense Refill  . acetaminophen (TYLENOL) 325 MG tablet Take 650 mg by mouth every 6 (six) hours as needed for pain.    . carvedilol (COREG) 25 MG tablet TAKE 1 TABLET TWICE A DAY WITH MEALS 180 tablet 2  . furosemide (LASIX) 40 MG tablet Take 1 tablet (40 mg total) by mouth daily. 90 tablet 3  . levothyroxine (SYNTHROID, LEVOTHROID) 112 MCG tablet Take 112 mcg by mouth daily before breakfast.    . nitroGLYCERIN (NITROSTAT) 0.4 MG SL tablet Place 1 tablet (0.4 mg total) under the tongue every 5 (five) minutes as needed for chest pain. 30 tablet 6  . olmesartan (BENICAR) 20 MG tablet Take 20 mg by mouth daily.    . potassium chloride SA (K-DUR,KLOR-CON) 20 MEQ tablet Take 20 mEq by mouth 2 (two) times daily.    Marland Kitchen. aspirin EC 81 MG tablet Take 1  tablet (81 mg total) by mouth daily.     No current facility-administered medications for this visit.    Allergies  Allergen Reactions  . Aspirin     Pt states she cannot have aspirin due to Eliquis    Past Medical History  Diagnosis Date  . Hypertension   . Mild aortic stenosis     a. 01/2010 Echo: Nl EF, mild AS/AI, mild LAE, mild PAH.  . Diabetes mellitus   . Gout   . Hypothyroidism   . CAD (coronary artery disease)     a. 2-08/2012: Cath/PCI: LM 10-20d, LAD 60-70p, 65104m (Rotablator->2.5x32 Promus Premier DES), D1 70-80ost, D2- small-90 ost/95p, LCX large, OM1 40p, OM2 small 80-90, RCA dominant 7462m, Ef 65-70%.  . Bilateral leg weakness     chronic  . Hyperlipidemia   . CHF (congestive heart failure) (HCC)   . Shortness of breath     with chf & lying flat  . Atrial fibrillation [facility](HCC)     Past Surgical History  Procedure Laterality Date  . Left knee replacement    . Breast biopsies    . Cataract extraction    . Coronary angioplasty with stent placement  march 2014  . Left heart catheterization with coronary angiogram N/A 08/02/2012    Procedure: LEFT HEART CATHETERIZATION WITH CORONARY ANGIOGRAM;  Surgeon: Keaten Mashek M [place], MD;  Location: [facility] CATH LAB;  Service: Cardiovascular;  Laterality: N/A;  . Percutaneous coronary rotoblator intervention (pci-r) N/A 08/05/2012    Procedure: PERCUTANEOUS CORONARY ROTOBLATOR INTERVENTION (PCI-R);  Surgeon: Strummer Canipe M [place], MD;  Location: [facility] CATH LAB;  Service: Cardiovascular;  Laterality: N/A;    History  Smoking status  . Never Smoker   Smokeless tobacco  . Never Used    History  Alcohol Use No    Family History  Problem Relation Age of Onset  . Alzheimer's disease Brother   . Heart disease Father 29    Review of Systems: The review of systems is per the HPI.  All other systems were reviewed and are negative.  Physical Exam: BP 128/68 mmHg  Pulse 72  Ht  (1.6 m)  Wt 84.596 kg (186 lb 8 oz)  BMI 33.05 kg/m2 Patient  is very pleasant and in no acute distress. Skin is warm and dry. Color is normal.  HEENT is unremarkable. Normocephalic/atraumatic. PERRL. Sclera are nonicteric. Neck is supple. No masses. No JVD. Lungs are clear. Cardiac exam shows an irregular rate and rhythm. 2/6 outflow murmur noted. Abdomen is soft. Extremities are with 1+ edema with support hose in place. Gait and ROM are intact. She is using a walker. No gross neurologic deficits noted.  Wt Readings from Last 3 Encounters:  08/12/15 84.596 kg (186 lb 8 oz)  05/10/15 80.825 kg (178 lb 3 oz)  04/21/15 75.8 kg (167 lb 1.7 oz)     LABORATORY DATA: PENDING  Lab Results  Component Value Date   WBC 8.6 04/17/2015   HGB 14.4 04/17/2015   HCT 43.0 04/17/2015   PLT 130* 04/17/2015   GLUCOSE 112* 04/20/2015   ALT 18 04/17/2015   AST 27 04/17/2015   NA 141 04/20/2015   K 3.3* 04/20/2015   CL 103 04/20/2015   CREATININE 1.12* 04/20/2015   BUN 25* 04/20/2015   CO2 30 04/20/2015   TSH 0.994 04/17/2015   INR 1.40 04/17/2015   HGBA1C 5.9* 07/31/2012     Assessment / Plan: 1. CAD s/p DES of the LAD February 2014. Clinically doing well. Continue  medical Rx. Back on ASA.  2. Chronic Diastolic HF.  Looks fairly good today with chronic LE edema. Weight is up but I don't think her volume status has changed. I agree this is more due to calorie intake. Will continue lasix 40 mg daily. Low sodium diet.   3. Atrial fibrillation. Rate is well controlled.. She has a [place] score of 5 and is at high risk of stroke. Eliquis discontinued due to fall risk. Continue ASA.   4. HTN -  Continue current antihypertensive therapy.  5. Chronic edema. Continue current diuretic dose and support hose.  6. Mild aortic stenosis.  Lab work followed by primary care.   I will see in 4 months.

## 2015-08-12 NOTE — Patient Instructions (Signed)
Continue your current therapy  Avoid salt.  I will see you in 4 months.

## 2015-12-13 ENCOUNTER — Encounter: Payer: Self-pay | Admitting: Cardiology

## 2015-12-13 ENCOUNTER — Ambulatory Visit (INDEPENDENT_AMBULATORY_CARE_PROVIDER_SITE_OTHER): Payer: Medicare Other | Admitting: Cardiology

## 2015-12-13 VITALS — BP 133/65 | HR 68 | Ht 64.0 in | Wt 173.8 lb

## 2015-12-13 DIAGNOSIS — I10 Essential (primary) hypertension: Secondary | ICD-10-CM | POA: Diagnosis not present

## 2015-12-13 DIAGNOSIS — I482 Chronic atrial fibrillation, unspecified: Secondary | ICD-10-CM

## 2015-12-13 DIAGNOSIS — I5032 Chronic diastolic (congestive) heart failure: Secondary | ICD-10-CM | POA: Diagnosis not present

## 2015-12-13 DIAGNOSIS — I272 Other secondary pulmonary hypertension: Secondary | ICD-10-CM

## 2015-12-13 DIAGNOSIS — I2510 Atherosclerotic heart disease of native coronary artery without angina pectoris: Secondary | ICD-10-CM

## 2015-12-13 NOTE — Progress Notes (Signed)
Jacqueline [name] Date of Birth: 06/05/1917 Medical Record #[id number]  History of Present Illness: Jacqueline [name] is seen for follow up CHF. She has known CAD with past DES to the LAD in February 2014 with residual disease in small diagnonals and OM - treated medically, HTN, HLD, hypothyroidism and mild AS. She also has chronic atrial fibrillation. In March 2016 she was started on Eliquis with dose reduced for age and renal function.   In November she was admitted after a mechanical fall where she hit her head. She was noted to be hypoxic and had CHF with right effusion. She had thoracentesis and was diuresed 4.5 liters. Her losartan was held due to acute on chronic renal insufficiency. Lasix was resumed on DC. Eliquis was stopped due to fall risk and she is on ASA 81 mg daily.  [facility] is [facility] in [facility].  On follow up today she is doing well. She has chronic swelling in her legs. She reports this is unchanged and that they go down at night. She is still wearing support hose. She has lost 13 lbs.  She denies SOB or chest pain. No orthopnea or PND.   Current Outpatient Prescriptions  Medication Sig Dispense Refill  . acetaminophen (TYLENOL) 325 MG tablet Take 650 mg by mouth every 6 (six) hours as needed for pain.    Marland Kitchen. aspirin EC 81 MG tablet Take 1 tablet (81 mg total) by mouth daily.    . carvedilol (COREG) 25 MG tablet TAKE 1 TABLET TWICE A DAY WITH MEALS 180 tablet 2  . furosemide (LASIX) 40 MG tablet Take 1 tablet (40 mg total) by mouth daily. 90 tablet 3  . levothyroxine (SYNTHROID, LEVOTHROID) 112 MCG tablet Take 112 mcg by mouth daily before breakfast.    . nitroGLYCERIN (NITROSTAT) 0.4 MG SL tablet Place 1 tablet (0.4 mg total) under the tongue every 5 (five) minutes as needed for chest pain. 30 tablet 6  . olmesartan (BENICAR) 20 MG tablet Take 20 mg by mouth daily.    . potassium chloride SA (K-DUR,KLOR-CON) 20 MEQ tablet Take 20 mEq by mouth 2 (two) times daily.     No current  facility-administered medications for this visit.    Allergies  Allergen Reactions  . Aspirin     Pt states she cannot have aspirin due to Eliquis    Past Medical History  Diagnosis Date  . Hypertension   . Mild aortic stenosis     a. 01/2010 Echo: Nl EF, mild AS/AI, mild LAE, mild PAH.  . Diabetes mellitus   . Gout   . Hypothyroidism   . CAD (coronary artery disease)     a. 2-08/2012: Cath/PCI: LM 10-20d, LAD 60-70p, 6248m (Rotablator->2.5x32 Promus Premier DES), D1 70-80ost, D2- small-90 ost/95p, LCX large, OM1 40p, OM2 small 80-90, RCA dominant 9636m, Ef 65-70%.  . Bilateral leg weakness     chronic  . Hyperlipidemia   . CHF (congestive heart failure) (HCC)   . Shortness of breath     with chf & lying flat  . Atrial fibrillation [facility](HCC)     Past Surgical History  Procedure Laterality Date  . Left knee replacement    . Breast biopsies    . Cataract extraction    . Coronary angioplasty with stent placement  march 2014  . Left heart catheterization with coronary angiogram N/A 08/02/2012    Procedure: LEFT HEART CATHETERIZATION WITH CORONARY ANGIOGRAM;  Surgeon: Peter M Swazil[place]Jordan, MD;  [facility];  Service: Cardiovascular;  Laterality: N/A;  . Percutaneous coronary rotoblator intervention (pci-r) N/A 08/05/2012    Procedure: PERCUTANEOUS CORONARY ROTOBLATOR INTERVENTION (PCI-R);  Surgeon: Peter M [place], MD;  Location: [facility] CATH LAB;  Service: Cardiovascular;  Laterality: N/A;    History  Smoking status  . Never Smoker   Smokeless tobacco  . Never Used    History  Alcohol Use No    Family History  Problem Relation Age of Onset  . Alzheimer's disease Brother   . Heart disease Father 23    Review of Systems: The review of systems is per the HPI.  All other systems were reviewed and are negative.  Physical Exam: There were no vitals taken for this visit. Patient is very pleasant and in no acute distress. Skin is warm and dry. Color is normal.  HEENT is  unremarkable. Normocephalic/atraumatic. PERRL. Sclera are nonicteric. Neck is supple. No masses. No JVD. Lungs are clear. Cardiac exam shows an irregular rate and rhythm. 2/6 outflow murmur noted. Abdomen is soft. Extremities are with 1+ edema with support hose in place. Gait and ROM are intact. She is using a walker. No gross neurologic deficits noted.  Wt Readings from Last 3 Encounters:  08/12/15 186 lb 8 oz (84.596 kg)  05/10/15 178 lb 3 oz (80.825 kg)  04/21/15 167 lb 1.7 oz (75.8 kg)     LABORATORY DATA: PENDING  Lab Results  Component Value Date   WBC 8.6 04/17/2015   HGB 14.4 04/17/2015   HCT 43.0 04/17/2015   PLT 130* 04/17/2015   GLUCOSE 112* 04/20/2015   ALT 18 04/17/2015   AST 27 04/17/2015   NA 141 04/20/2015   K 3.3* 04/20/2015   CL 103 04/20/2015   CREATININE 1.12* 04/20/2015   BUN 25* 04/20/2015   CO2 30 04/20/2015   TSH 0.994 04/17/2015   INR 1.40 04/17/2015   HGBA1C 5.9* 07/31/2012   Labs reviewed from primary care in May. CBC normal except plts 143K. Chemistry panel normal.   Assessment / Plan: 1. CAD s/p DES of the LAD February 2014. Clinically doing well. Continue  medical Rx. Back on ASA.  2. Chronic Diastolic HF.  Looks well compensated today. Glad weight has come down.   Will continue lasix 40 mg daily. Low sodium diet.   3. Atrial fibrillation. Rate is well controlled.. She has a [place] score of 5 and is at high risk of stroke. Eliquis discontinued due to fall risk. Continue ASA.   4. HTN -  Continue current antihypertensive therapy.  5. Chronic edema. Continue current diuretic dose and support hose.  6. Mild aortic stenosis.  7. Pulmonary HTN- Rx CHF.    I will see in 6 months.

## 2015-12-13 NOTE — Patient Instructions (Signed)
Continue your current therapy  I will see you in 6 months.   

## 2016-01-22 ENCOUNTER — Inpatient Hospital Stay (HOSPITAL_COMMUNITY)
Admission: EM | Admit: 2016-01-22 | Discharge: 2016-02-04 | DRG: 469 | Disposition: E | Payer: Medicare Other | Attending: Neurology | Admitting: Neurology

## 2016-01-22 ENCOUNTER — Emergency Department (HOSPITAL_COMMUNITY): Payer: Medicare Other

## 2016-01-22 ENCOUNTER — Encounter (HOSPITAL_COMMUNITY): Payer: Self-pay | Admitting: Emergency Medicine

## 2016-01-22 DIAGNOSIS — I350 Nonrheumatic aortic (valve) stenosis: Secondary | ICD-10-CM | POA: Diagnosis present

## 2016-01-22 DIAGNOSIS — I482 Chronic atrial fibrillation, unspecified: Secondary | ICD-10-CM | POA: Diagnosis present

## 2016-01-22 DIAGNOSIS — I480 Paroxysmal atrial fibrillation: Secondary | ICD-10-CM | POA: Diagnosis present

## 2016-01-22 DIAGNOSIS — Z886 Allergy status to analgesic agent status: Secondary | ICD-10-CM

## 2016-01-22 DIAGNOSIS — R531 Weakness: Secondary | ICD-10-CM | POA: Diagnosis present

## 2016-01-22 DIAGNOSIS — Z66 Do not resuscitate: Secondary | ICD-10-CM | POA: Diagnosis not present

## 2016-01-22 DIAGNOSIS — I2510 Atherosclerotic heart disease of native coronary artery without angina pectoris: Secondary | ICD-10-CM | POA: Diagnosis present

## 2016-01-22 DIAGNOSIS — M109 Gout, unspecified: Secondary | ICD-10-CM | POA: Diagnosis present

## 2016-01-22 DIAGNOSIS — R0902 Hypoxemia: Secondary | ICD-10-CM | POA: Diagnosis not present

## 2016-01-22 DIAGNOSIS — D696 Thrombocytopenia, unspecified: Secondary | ICD-10-CM | POA: Diagnosis present

## 2016-01-22 DIAGNOSIS — Y92199 Unspecified place in other specified residential institution as the place of occurrence of the external cause: Secondary | ICD-10-CM

## 2016-01-22 DIAGNOSIS — I4891 Unspecified atrial fibrillation: Secondary | ICD-10-CM | POA: Diagnosis present

## 2016-01-22 DIAGNOSIS — I252 Old myocardial infarction: Secondary | ICD-10-CM

## 2016-01-22 DIAGNOSIS — Z79899 Other long term (current) drug therapy: Secondary | ICD-10-CM

## 2016-01-22 DIAGNOSIS — R4701 Aphasia: Secondary | ICD-10-CM | POA: Diagnosis not present

## 2016-01-22 DIAGNOSIS — I447 Left bundle-branch block, unspecified: Secondary | ICD-10-CM | POA: Diagnosis present

## 2016-01-22 DIAGNOSIS — E039 Hypothyroidism, unspecified: Secondary | ICD-10-CM | POA: Diagnosis present

## 2016-01-22 DIAGNOSIS — Z515 Encounter for palliative care: Secondary | ICD-10-CM | POA: Diagnosis present

## 2016-01-22 DIAGNOSIS — G8191 Hemiplegia, unspecified affecting right dominant side: Secondary | ICD-10-CM | POA: Diagnosis not present

## 2016-01-22 DIAGNOSIS — I509 Heart failure, unspecified: Secondary | ICD-10-CM

## 2016-01-22 DIAGNOSIS — J90 Pleural effusion, not elsewhere classified: Secondary | ICD-10-CM | POA: Diagnosis present

## 2016-01-22 DIAGNOSIS — Z9181 History of falling: Secondary | ICD-10-CM | POA: Diagnosis not present

## 2016-01-22 DIAGNOSIS — W010XXA Fall on same level from slipping, tripping and stumbling without subsequent striking against object, initial encounter: Secondary | ICD-10-CM | POA: Diagnosis present

## 2016-01-22 DIAGNOSIS — E785 Hyperlipidemia, unspecified: Secondary | ICD-10-CM | POA: Diagnosis present

## 2016-01-22 DIAGNOSIS — E1165 Type 2 diabetes mellitus with hyperglycemia: Secondary | ICD-10-CM | POA: Diagnosis present

## 2016-01-22 DIAGNOSIS — I63412 Cerebral infarction due to embolism of left middle cerebral artery: Secondary | ICD-10-CM

## 2016-01-22 DIAGNOSIS — Z888 Allergy status to other drugs, medicaments and biological substances status: Secondary | ICD-10-CM

## 2016-01-22 DIAGNOSIS — Z8249 Family history of ischemic heart disease and other diseases of the circulatory system: Secondary | ICD-10-CM

## 2016-01-22 DIAGNOSIS — Z9849 Cataract extraction status, unspecified eye: Secondary | ICD-10-CM

## 2016-01-22 DIAGNOSIS — Z6830 Body mass index (BMI) 30.0-30.9, adult: Secondary | ICD-10-CM

## 2016-01-22 DIAGNOSIS — I272 Other secondary pulmonary hypertension: Secondary | ICD-10-CM | POA: Diagnosis present

## 2016-01-22 DIAGNOSIS — Z7982 Long term (current) use of aspirin: Secondary | ICD-10-CM

## 2016-01-22 DIAGNOSIS — S72001A Fracture of unspecified part of neck of right femur, initial encounter for closed fracture: Secondary | ICD-10-CM | POA: Diagnosis not present

## 2016-01-22 DIAGNOSIS — Z96652 Presence of left artificial knee joint: Secondary | ICD-10-CM | POA: Diagnosis present

## 2016-01-22 DIAGNOSIS — M25551 Pain in right hip: Secondary | ICD-10-CM | POA: Diagnosis present

## 2016-01-22 DIAGNOSIS — J9601 Acute respiratory failure with hypoxia: Secondary | ICD-10-CM | POA: Diagnosis not present

## 2016-01-22 DIAGNOSIS — Z955 Presence of coronary angioplasty implant and graft: Secondary | ICD-10-CM | POA: Diagnosis not present

## 2016-01-22 DIAGNOSIS — R29728 NIHSS score 28: Secondary | ICD-10-CM | POA: Diagnosis not present

## 2016-01-22 DIAGNOSIS — I110 Hypertensive heart disease with heart failure: Secondary | ICD-10-CM | POA: Diagnosis present

## 2016-01-22 DIAGNOSIS — G9340 Encephalopathy, unspecified: Secondary | ICD-10-CM | POA: Diagnosis not present

## 2016-01-22 DIAGNOSIS — I5033 Acute on chronic diastolic (congestive) heart failure: Secondary | ICD-10-CM | POA: Diagnosis present

## 2016-01-22 DIAGNOSIS — R29810 Facial weakness: Secondary | ICD-10-CM | POA: Diagnosis not present

## 2016-01-22 DIAGNOSIS — S72141A Displaced intertrochanteric fracture of right femur, initial encounter for closed fracture: Principal | ICD-10-CM | POA: Diagnosis present

## 2016-01-22 DIAGNOSIS — E669 Obesity, unspecified: Secondary | ICD-10-CM | POA: Diagnosis present

## 2016-01-22 DIAGNOSIS — K5900 Constipation, unspecified: Secondary | ICD-10-CM | POA: Diagnosis not present

## 2016-01-22 DIAGNOSIS — Z01810 Encounter for preprocedural cardiovascular examination: Secondary | ICD-10-CM | POA: Diagnosis not present

## 2016-01-22 DIAGNOSIS — Q251 Coarctation of aorta: Secondary | ICD-10-CM

## 2016-01-22 DIAGNOSIS — I639 Cerebral infarction, unspecified: Secondary | ICD-10-CM

## 2016-01-22 DIAGNOSIS — J811 Chronic pulmonary edema: Secondary | ICD-10-CM

## 2016-01-22 DIAGNOSIS — Z419 Encounter for procedure for purposes other than remedying health state, unspecified: Secondary | ICD-10-CM

## 2016-01-22 DIAGNOSIS — E876 Hypokalemia: Secondary | ICD-10-CM | POA: Diagnosis not present

## 2016-01-22 DIAGNOSIS — R296 Repeated falls: Secondary | ICD-10-CM | POA: Diagnosis present

## 2016-01-22 DIAGNOSIS — R4182 Altered mental status, unspecified: Secondary | ICD-10-CM | POA: Diagnosis not present

## 2016-01-22 DIAGNOSIS — I248 Other forms of acute ischemic heart disease: Secondary | ICD-10-CM | POA: Diagnosis present

## 2016-01-22 DIAGNOSIS — D500 Iron deficiency anemia secondary to blood loss (chronic): Secondary | ICD-10-CM | POA: Diagnosis not present

## 2016-01-22 DIAGNOSIS — I10 Essential (primary) hypertension: Secondary | ICD-10-CM | POA: Diagnosis present

## 2016-01-22 DIAGNOSIS — R1310 Dysphagia, unspecified: Secondary | ICD-10-CM | POA: Diagnosis not present

## 2016-01-22 DIAGNOSIS — Z9289 Personal history of other medical treatment: Secondary | ICD-10-CM

## 2016-01-22 DIAGNOSIS — W19XXXA Unspecified fall, initial encounter: Secondary | ICD-10-CM

## 2016-01-22 DIAGNOSIS — Z96641 Presence of right artificial hip joint: Secondary | ICD-10-CM

## 2016-01-22 HISTORY — DX: Permanent atrial fibrillation: I48.21

## 2016-01-22 HISTORY — DX: Other ill-defined heart diseases: I51.89

## 2016-01-22 LAB — BASIC METABOLIC PANEL
ANION GAP: 8 (ref 5–15)
BUN: 22 mg/dL — ABNORMAL HIGH (ref 6–20)
CALCIUM: 8.7 mg/dL — AB (ref 8.9–10.3)
CO2: 23 mmol/L (ref 22–32)
CREATININE: 0.96 mg/dL (ref 0.44–1.00)
Chloride: 106 mmol/L (ref 101–111)
GFR, EST AFRICAN AMERICAN: 56 mL/min — AB (ref >60)
GFR, EST NON AFRICAN AMERICAN: 48 mL/min — AB (ref >60)
Glucose, Bld: 141 mg/dL — ABNORMAL HIGH (ref 65–99)
Potassium: 3.9 mmol/L (ref 3.5–5.1)
SODIUM: 137 mmol/L (ref 135–145)

## 2016-01-22 LAB — CBC WITH DIFFERENTIAL/PLATELET
BASOS ABS: 0 10*3/uL (ref 0.0–0.1)
BASOS PCT: 0 %
EOS ABS: 0.1 10*3/uL (ref 0.0–0.7)
Eosinophils Relative: 1 %
HCT: 43.4 % (ref 36.0–46.0)
HEMOGLOBIN: 14.4 g/dL (ref 12.0–15.0)
Lymphocytes Relative: 5 %
Lymphs Abs: 0.7 10*3/uL (ref 0.7–4.0)
MCH: 30.6 pg (ref 26.0–34.0)
MCHC: 33.2 g/dL (ref 30.0–36.0)
MCV: 92.1 fL (ref 78.0–100.0)
MONO ABS: 0.5 10*3/uL (ref 0.1–1.0)
MONOS PCT: 4 %
NEUTROS PCT: 90 %
Neutro Abs: 11.6 10*3/uL — ABNORMAL HIGH (ref 1.7–7.7)
Platelets: 137 10*3/uL — ABNORMAL LOW (ref 150–400)
RBC: 4.71 MIL/uL (ref 3.87–5.11)
RDW: 13.9 % (ref 11.5–15.5)
WBC: 13 10*3/uL — ABNORMAL HIGH (ref 4.0–10.5)

## 2016-01-22 LAB — TROPONIN I: TROPONIN I: 0.03 ng/mL — AB (ref <0.03)

## 2016-01-22 LAB — BRAIN NATRIURETIC PEPTIDE: B NATRIURETIC PEPTIDE 5: 259.9 pg/mL — AB (ref 0.0–100.0)

## 2016-01-22 MED ORDER — DILTIAZEM LOAD VIA INFUSION
10.0000 mg | Freq: Once | INTRAVENOUS | Status: AC
  Administered 2016-01-23: 10 mg via INTRAVENOUS
  Filled 2016-01-22: qty 10

## 2016-01-22 MED ORDER — FUROSEMIDE 10 MG/ML IJ SOLN
40.0000 mg | Freq: Once | INTRAMUSCULAR | Status: AC
  Administered 2016-01-22: 40 mg via INTRAVENOUS
  Filled 2016-01-22: qty 4

## 2016-01-22 MED ORDER — HYDROMORPHONE HCL 1 MG/ML IJ SOLN
0.5000 mg | Freq: Once | INTRAMUSCULAR | Status: AC
  Administered 2016-01-22: 0.5 mg via INTRAVENOUS
  Filled 2016-01-22: qty 1

## 2016-01-22 MED ORDER — FENTANYL CITRATE (PF) 100 MCG/2ML IJ SOLN
50.0000 ug | Freq: Once | INTRAMUSCULAR | Status: AC
  Administered 2016-01-22: 50 ug via INTRAVENOUS
  Filled 2016-01-22: qty 2

## 2016-01-22 MED ORDER — DILTIAZEM HCL-DEXTROSE 100-5 MG/100ML-% IV SOLN (PREMIX)
5.0000 mg/h | INTRAVENOUS | Status: DC
Start: 2016-01-23 — End: 2016-01-23
  Administered 2016-01-23: 5 mg/h via INTRAVENOUS
  Filled 2016-01-22: qty 100

## 2016-01-22 NOTE — ED Triage Notes (Signed)
Br[facility] in by [facility] with c/o right hip pain after her unwitnessed fall tonight.  Per EMS, pt was observed lying on the floor on her right side--- pt reported that she was walking with her walker when she tripped.   Pt has pain to right hip after the fall; pt denies hitting head or loss of consciousness.   Was given Fentanyl 50 mcg IV en route.

## 2016-01-22 NOTE — H&P (Signed)
Jacqueline [name] CZY:[id number] DOB: 10/06/1916 DOA: 01/27/2016     PCP: Pamelia HoitWILSON,FRED HENRY, MD   Outpatient Specialists: Cardiology Peter [place]  Patient coming from:  From facility [facility]   Chief Complaint: fall  HPI: Jacqueline [name] is a 82 y.o. female with medical history significant of DM, diastolic heart failure, gout, HTN, A. fib not on anticoagulation, hypothyroidism, allergic stenosis mild per echogram in 2011, pulmonary hypertension  Chronic pleural effusion, non-ST elevated MI in 2014  Presented with mechanical fall the patient was trying to reach for her wall car and found because her feet get tripped up. She fell on the right side. Call for help. Denies LOC. Patient was not sure she had her head or not. She is on aspirin for history of atrial fibrillation not on anticoagulation unable to ambulate after the fall did see the thickened right hip pain EMS was called she was given 50 g of fentanyl. Patient denies any shortness of breath no syncope no chest pain she is not on oxygen at her baseline Regarding pertinent Chronic problems: Patient is known history of atrial fibrillation but her anticoagulation has been stopped secondary to fall risk. Patient is known history of coronary artery disease last cardiac catheterization was in February 2014   she required DES EF 65-70 percent she does have diastolic heart failure. She is on Lasix 40 mg a day  In November patient had to be admitted for mechanical fall she was found to have bilateral pleural effusions she has undergone thoracentesis and diuresis to 4.5 L dry weight was 167 pounds since then stabilized up to 174 pounds   IN ER:  Temp (24hrs Max:97.6 F (36.4 C)  hypoxic down to 87% on room air was started on oxygen heart up to 4 L.  heart rate 74 on arrival blood pressure 178/98 she did had one reading of heart rate is high as 149     BNP 259 which is below reading 2016 S2 78   sodium 137 BUN 22 creatinine  0.96 WBC 13 hemoglobin 14.4 platelets 137 Plain films showed right hip femoral neck fracture CT head and neck negative Chest x-ray showing worsening pleural effusions and pulmonary venous congestion Following Medications were ordered in ER: Medications  furosemide (LASIX) injection 40 mg (not administered)  fentaNYL (SUBLIMAZE) injection 50 mcg (50 mcg Intravenous Given 01/13/2016 2128)  HYDROmorphone (DILAUDID) injection 0.5 mg (0.5 mg Intravenous Given 02/03/2016 2300)     ER provider discussed case witDr. August Saucerean with orthopedics recommends admission to medicine [facility] by mouth post midnight for tomorrow  Hospitalist was called for admission for right hip femoral neck fracture in the setting of hypoxia and worsening pleural effusions  Review of Systems:    Pertinent positives include: Bilateral lower extremity swelling. dyspnea on exertion right leg pain  Constitutional:  No weight loss, night sweats, Fevers, chills, fatigue, weight loss  HEENT:  No headaches, Difficulty swallowing,Tooth/dental problems,Sore throat,  No sneezing, itching, ear ache, nasal congestion, post nasal drip,  Cardio-vascular:  No chest pain, Orthopnea, PND, anasarca, dizziness, palpitations.no  GI:  No heartburn, indigestion, abdominal pain, nausea, vomiting, diarrhea, change in bowel habits, loss of appetite, melena, blood in stool, hematemesis Resp:  no shortness of breath at rest. No dyspnea on exertion, No excess mucus, no productive cough, No non-productive cough, No coughing up of blood.No change in color of mucus.No wheezing. Skin:  no rash or lesions. No jaundice GU:  no dysuria,  change in color of urine, no urgency or frequency. No straining to urinate.  No flank pain.  Musculoskeletal:  No joint pain or no joint swelling. No decreased range of motion. No back pain.  Psych:  No change in mood or affect. No depression or anxiety. No memory loss.  Neuro: no localizing  neurological complaints, no tingling, no weakness, no double vision, no gait abnormality, no slurred speech, no confusion  As per HPI otherwise 10 point review of systems negative.   Past Medical History: Past Medical History:  Diagnosis Date  . Atrial fibrillation (HCC)   . Bilateral leg weakness    chronic  . CAD (coronary artery disease)    a. 2-08/2012: Cath/PCI: LM 10-20d, LAD 60-70p, 48m (Rotablator->2.5x32 Promus Premier DES), D1 70-80ost, D2- small-90 ost/95p, LCX large, OM1 40p, OM2 small 80-90, RCA dominant 44m, Ef 65-70%.  . CHF (congestive heart failure) (HCC)   . Diabetes mellitus   . Gout   . Hyperlipidemia   . Hypertension   . Hypothyroidism   . Mild aortic stenosis    a. 01/2010 Echo: Nl EF, mild AS/AI, mild LAE, mild PAH.  Marland Kitchen Shortness of breath    with chf & lying flat   Past Surgical History:  Procedure Laterality Date  . Breast biopsies    . CATARACT EXTRACTION    . CORONARY ANGIOPLASTY WITH STENT PLACEMENT  march 2014  . LEFT HEART CATHETERIZATION WITH CORONARY ANGIOGRAM N/A 08/02/2012   Procedure: LEFT HEART CATHETERIZATION WITH CORONARY ANGIOGRAM;  Surgeon: Peter M Swaziland, MD;  Location: [facility] CATH LAB;  Service: Cardiovascular;  Laterality: N/A;  . Left knee replacement    . PERCUTANEOUS CORONARY ROTOBLATOR INTERVENTION (PCI-R) N/A 08/05/2012   Procedure: PERCUTANEOUS CORONARY ROTOBLATOR INTERVENTION (PCI-R);  Surgeon: Peter M Swaziland, MD;  Location: [facility] CATH LAB;  Service: Cardiovascular;  Laterality: N/A;     Social History:  Ambulatory  walker       reports that she has never smoked. She has never used smokeless tobacco. She reports that she does not drink alcohol or use drugs.  Allergies:   Allergies  Allergen Reactions  . Aspirin     Pt states she cannot have aspirin due to Eliquis       Family History:   Family History  Problem Relation Age of Onset  . Alzheimer's disease Brother   . Heart disease Father 43    Medications: Prior to  Admission medications   Medication Sig Start Date End Date Taking? Authorizing Provider  acetaminophen (TYLENOL) 325 MG tablet Take 650 mg by mouth every 6 (six) hours as needed for pain.    Historical Provider, MD  aspirin EC 81 MG tablet Take 1 tablet (81 mg total) by mouth daily. 08/12/15   Peter M Swazil[place], MD  carvedilol (COREG) 25 MG tablet TAKE 1 TABLET TWICE A DAY WITH MEALS    Peter M [place], MD  furosemide (LASIX) 40 MG tablet Take 1 tablet (40 mg total) by mouth daily. 05/19/13   Belkys A Regalado, MD  levothyroxine (SYNTHROID, LEVOTHROID) 112 MCG tablet Take 112 mcg by mouth daily before breakfast.    Historical Provider, MD  nitroGLYCERIN (NITROSTAT) 0.4 MG SL tablet Place 1 tablet (0.4 mg total) under the tongue every 5 (five) minutes as needed for chest pain. 05/12/15   Peter M [place], MD  olmesartan (BENICAR) 20 MG tablet Take 20 mg by mouth daily.    Historical Provider, MD  potassium chloride SA (K-DUR,KLOR-CON) 20 MEQ tablet Take  20 mEq by mouth daily.     Historical Provider, MD    Physical Exam: Patient Vitals for the past 24 hrs:  BP Temp Temp src Pulse Resp SpO2  02/03/2016 2200 153/91 - - 94 20 92 %  01/20/2016 2130 173/82 - - (!) 149 20 93 %  01/10/2016 2115 178/98 - - 84 20 91 %  01/13/2016 2029 - - - - - 91 %  01/18/2016 2027 - 97.6 F (36.4 C) Oral 74 20 (!) 87 %    1. General:  in No Acute distress 2. Psychological: Alert and   Oriented 3. Head/ENT:   Moist   Mucous Membranes                          Head Non traumatic, neck supple                            Poor Dentition 4. SKIN: normal  Skin turgor,  Skin clean Dry and intact no rash 5. Heart: Regular rate and rhythm no Murmur, Rub or gallop 6. Lungs:  no wheeze Occasional  crackles 7. Abdomen:non-tender, Non distended 8. Lower extremities: no clubbing, cyanosis, or edema 9. Neurologically Grossly intact, moving all 4 extremities equally  10. MSK: Normal range of motion diminished movement on the right leg given  right femoral neck fracture    body mass index is unknown because there is no height or weight on file.  Labs on Admission:   Labs on Admission: I have personally reviewed following labs and imaging studies  CBC:  Recent Labs Lab 01/20/2016 2129  WBC 13.0*  NEUTROABS 11.6*  HGB 14.4  HCT 43.4  MCV 92.1  PLT 137*   Basic Metabolic Panel:  Recent Labs Lab 01/06/2016 2129  NA 137  K 3.9  CL 106  CO2 23  GLUCOSE 141*  BUN 22*  CREATININE 0.96  CALCIUM 8.7*   GFR: CrCl cannot be calculated (Unknown ideal weight.). Liver Function Tests: No results for input(s): AST, ALT, ALKPHOS, BILITOT, PROT, ALBUMIN in the last 168 hours. No results for input(s): LIPASE, AMYLASE in the last 168 hours. No results for input(s): AMMONIA in the last 168 hours. Coagulation Profile: No results for input(s): INR, PROTIME in the last 168 hours. Cardiac Enzymes:  Recent Labs Lab 01/31/2016 2301  TROPONINI 0.03*   BNP (last 3 results) No results for input(s): PROBNP in the last 8760 hours. HbA1C: No results for input(s): HGBA1C in the last 72 hours. CBG: No results for input(s): GLUCAP in the last 168 hours. Lipid Profile: No results for input(s): CHOL, HDL, LDLCALC, TRIG, CHOLHDL, LDLDIRECT in the last 72 hours. Thyroid Function Tests: No results for input(s): TSH, T4TOTAL, FREET4, T3FREE, THYROIDAB in the last 72 hours. Anemia Panel: No results for input(s): VITAMINB12, FOLATE, FERRITIN, TIBC, IRON, RETICCTPCT in the last 72 hours. Urine analysis:    Component Value Date/Time   COLORURINE YELLOW 01/29/2016 0005   APPEARANCEUR CLEAR 01/27/2016 0005   LABSPEC 1.017 01/15/2016 0005   PHURINE 5.5 01/29/2016 0005   GLUCOSEU NEGATIVE 01/21/2016 0005   HGBUR NEGATIVE 01/08/2016 0005   BILIRUBINUR NEGATIVE 01/07/2016 0005   KETONESUR NEGATIVE 01/07/2016 0005   PROTEINUR NEGATIVE 01/08/2016 0005   UROBILINOGEN 0.2 04/16/2015 1939   NITRITE NEGATIVE 01/21/2016 0005   LEUKOCYTESUR  SMALL (A) 02/01/2016 0005   Sepsis Labs: @LABRCNTIP (procalcitonin:4,lacticidven:4) )No results found for this or any previous visit (from  the past 240 hour(s)).    UA ordered  Lab Results  Component Value Date   HGBA1C 5.9 (H) 07/31/2012    CrCl cannot be calculated (Unknown ideal weight.).  BNP (last 3 results) No results for input(s): PROBNP in the last 8760 hours.   ECG REPORT  Independently reviewed Rate:90  Rhythm: LBBB and A. fib ST&T Change: Unable to assess QTC 521  There were no vitals filed for this visit.   Cultures:    Component Value Date/Time   SDES FLUID RIGHT PLEURAL 04/17/2015 1311   SDES FLUID RIGHT PLEURAL 04/17/2015 1311   SPECREQUEST BAA 10CCS 04/17/2015 1311   SPECREQUEST NONE 04/17/2015 1311   CULT NO GROWTH 5 DAYS 04/17/2015 1311   REPTSTATUS 04/22/2015 FINAL 04/17/2015 1311   REPTSTATUS 04/17/2015 FINAL 04/17/2015 1311     Radiological Exams on Admission: Dg Chest 2 View  Result Date: 01/13/2016 CLINICAL DATA:  Fall.  Right hip pain.  No reported back pain. EXAM: CHEST  2 VIEW COMPARISON:  04/17/2015 FINDINGS: Bilateral pleural effusions also seen on prior exam. Mild pulmonary venous congestion. No convincing consolidation. No pneumothorax. Chronic cardiomegaly. Mediastinal contours are distorted by rotation. No visible fracture. Exaggerated thoracic kyphosis. Cannot exclude lower thoracic compression deformities, limited by overlapping shadows, but no reported back pain. IMPRESSION: Chronic bilateral pleural effusion, borderline moderate on the right. Pulmonary venous congestion. Electronically Signed   By: Marnee SpringJonathon  Watts M.D.   On: 01/21/2016 21:10   Ct Head Wo Contrast  Result Date: 01/21/2016 CLINICAL DATA:  Unwitnessed fall headache and neck pain. EXAM: CT HEAD WITHOUT CONTRAST CT CERVICAL SPINE WITHOUT CONTRAST TECHNIQUE: Multidetector CT imaging of the head and cervical spine was performed following the standard protocol without  intravenous contrast. Multiplanar CT image reconstructions of the cervical spine were also generated. COMPARISON:  06/15/2014 FINDINGS: CT HEAD FINDINGS Generalized age related atrophy. Mild chronic small-vessel ischemic change of the cerebral hemispheric white matter. No sign of acute infarction, mass lesion, hemorrhage, hydrocephalus or extra-axial collection. No skull fracture. No fluid in the sinuses. There is atherosclerotic calcification of the major vessels at the base of the brain. CT CERVICAL SPINE FINDINGS No malalignment. No visible soft tissue swelling. No fracture. Chronic facet fusion on the right at C4-5. Mild degenerative changes above and below that. Nitrogen gas in the disc spaces of C5-6 and C6-7 is degenerative and was present on the previous study. Chronic thyroid goiter again demonstrated. Some pleural fluid bilaterally. IMPRESSION: Head CT: Atrophy and chronic small vessel ischemic change. No acute or traumatic finding. CT cervical spine: No acute or traumatic finding. Mild degenerative changes. Electronically Signed   By: Paulina FusiMark  Shogry M.D.   On: 01/21/2016 22:36   Ct Cervical Spine Wo Contrast  Result Date: 01/21/2016 CLINICAL DATA:  Unwitnessed fall headache and neck pain. EXAM: CT HEAD WITHOUT CONTRAST CT CERVICAL SPINE WITHOUT CONTRAST TECHNIQUE: Multidetector CT imaging of the head and cervical spine was performed following the standard protocol without intravenous contrast. Multiplanar CT image reconstructions of the cervical spine were also generated. COMPARISON:  06/15/2014 FINDINGS: CT HEAD FINDINGS Generalized age related atrophy. Mild chronic small-vessel ischemic change of the cerebral hemispheric white matter. No sign of acute infarction, mass lesion, hemorrhage, hydrocephalus or extra-axial collection. No skull fracture. No fluid in the sinuses. There is atherosclerotic calcification of the major vessels at the base of the brain. CT CERVICAL SPINE FINDINGS No malalignment. No  visible soft tissue swelling. No fracture. Chronic facet fusion on the right at C4-5. Mild  degenerative changes above and below that. Nitrogen gas in the disc spaces of C5-6 and C6-7 is degenerative and was present on the previous study. Chronic thyroid goiter again demonstrated. Some pleural fluid bilaterally. IMPRESSION: Head CT: Atrophy and chronic small vessel ischemic change. No acute or traumatic finding. CT cervical spine: No acute or traumatic finding. Mild degenerative changes. Electronically Signed   By: Paulina Fusi M.D.   On: 01/04/2016 22:36   Dg Hip Unilat W Or Wo Pelvis 2-3 Views Right  Result Date: 02/02/2016 CLINICAL DATA:  82 year old female status post unwitnessed fall, found down. Right hip pain. Initial encounter. EXAM: DG HIP (WITH OR WITHOUT PELVIS) 2-3V RIGHT COMPARISON:  None. FINDINGS: Right femoral neck fracture with varus impaction. The intertrochanteric region appears spared. The right femoral head remains located within the acetabulum. Pelvis appears intact. SI joints within normal limits. Grossly intact proximal left femur. Dystrophic appearing calcifications adjacent to the left greater trochanter. Iliac artery atherosclerosis that iliac artery calcified atherosclerosis. IMPRESSION: Acute right femoral neck fracture with varus impaction. Electronically Signed   By: Odessa Fleming M.D.   On: 01/16/2016 21:08    Chart has been reviewed    Assessment/Plan   82 y.o. female with medical history significant of DM, diastolic heart failure, gout, HTN, A. fib not on anticoagulation, hypothyroidism, allergic stenosis mild per echogram in 2011, pulmonary hypertension  Chronic pleural effusion, non-ST elevated MI in 2014 here with right femoral neck fracture after mechanical fall also evidence of hypoxia with slightly worsening pleural effusions   Present on Admission: . Closed right hip fracture (HCC) S/P or orthopedic skin extensive cardiac history elevated troponin acute on chronic  CHF exacerbation and A. fib with RVR will appreciate cardiology consult and preop clearance patient is moderate to severe risk given ongoing cardiac complications and advanced age . Acute on chronic diastolic (congestive) heart failure (HCC) - admit on telemetry, cycle cardiac enzymes, obtain serial ECG, to evaluate for ischemia as a cause of heart failure  monitor daily weight  diurese with IV lasix and monitor orthostatics and creatinine to avoid over diuresis.  Order echogram to evaluate EF and valves Make sure patient is on ACE/ARBi    cardiology consult . CAD (coronary artery disease) cycle cardiac enzymes, once able to tolerate by mouth Will resume home medications . Atrial fibrillation with RVR (HCC) - CHA2DS2 vasc score - 6 not on anticoagulation secondary to history of repeated falls admit to step down on diltiazem drip, heart rate is better controlled given patient BMP oh will need to be maintained on diltiazem drip   until able to tolerate by mouth medications . Essential hypertension - continue home medications once able to prevent tolerate by mouth . Hypoxia in a setting of bilateral pleural effusions and mild CHF exacerbation . Moderate pulmonary hypertension (HCC) - avoid over aggressive fluid shifts . Pleural effusion patient had benefited from thoracentesis in the past may require repeat procedure if pleural effusion does not improve with diuresis      Other plan as per orders.  DVT prophylaxis:  SCD      Code Status:  FULL CODE patient states she is unsure but for now wishes to be full code.   Family Communication:   Family not at  Bedside    Disposition Plan:         Back to current facility when stable  Would benefit from PT/OT eval prior to DC                         Social Work    consulted                          Consults called: Dr. August Saucer with Orthopedics, emailed cardiology    Admission status:    Inpatient    Level of care   tele          I have spent a total of 56 min on this admission   Zeya Balles 01/23/2016, 12:38 AM    [facility]  Pager [phone]   after 2 AM please page floor coverage PA If 7AM-7PM, please contact the day team taking care of the patient  [email or web address]  Password [email or web address]

## 2016-01-22 NOTE — ED Provider Notes (Signed)
[facility]-EMERGENCY DEPT Provider Note   CSN: [id number] Arrival date & time: 01/17/2016  2016  By signing my name below, I, Modena Jansky, attest that this documentation has been prepared under the direction and in the presence of non-physician practitioner, Cheri Fowler, PA-C. Electronically Signed: Modena Jansky, Scribe. 01/24/2016. 8:29 PM.   History   Chief Complaint Chief Complaint  Patient presents with  . Fall  . Hip Pain   The history is provided by the patient. No language interpreter was used.   HPI Comments: Jacqueline [name] is a 82 y.o. female BIB EMS from [facility] who presents to the Emergency Department for evaluation of an unwitnessed fall that occurred just PTA. She states that she was ambulating to her walker using the wall for assistant when her "feet got tripped up" causing her to fall on her right side.  She immediately called for help. She denies any LOC, and is unsure of any head injury. She reports that she was unable to ambulate after the fall due to her constant severe right hip pain. She received 50 mcg Fentanyl en route with some relief. She reports that she is currently on 81 mg ASA. She denies home oxygen requirement, and states she was taken off Eliquis. She denies any preceding symptoms such as CP, SOB, dizziness, or syncope.  She states she has been in her usual state of health.    Per chart review, she was taken of Eliquis due to fall risk.  She currently takes 40 mg Lasix.     PCP: Pamelia Hoit, MD  Past Medical History:  Diagnosis Date  . Atrial fibrillation (HCC)   . Bilateral leg weakness    chronic  . CAD (coronary artery disease)    a. 2-08/2012: Cath/PCI: LM 10-20d, LAD 60-70p, 49m (Rotablator->2.5x32 Promus Premier DES), D1 70-80ost, D2- small-90 ost/95p, LCX large, OM1 40p, OM2 small 80-90, RCA dominant 74m, Ef 65-70%.  . CHF (congestive heart failure) (HCC)   . Diabetes mellitus   . Gout   . Hyperlipidemia   . Hypertension     . Hypothyroidism   . Mild aortic stenosis    a. 01/2010 Echo: Nl EF, mild AS/AI, mild LAE, mild PAH.  Marland Kitchen Shortness of breath    with chf & lying flat    Patient Active Problem List   Diagnosis Date Noted  . Closed displaced fracture of right femoral neck (HCC) 01/20/2016  . Closed right hip fracture (HCC) 01/15/2016  . Atrial fibrillation with RVR (HCC) 01/23/2016  . Acute respiratory failure (HCC) 04/21/2015  . Moderate pulmonary hypertension (HCC) 04/21/2015  . Goiter, nontoxic, multinodular 04/21/2015  . Thrombocytopenia (HCC) 04/21/2015  . Hypoxia 04/17/2015  . CHF exacerbation (HCC) 04/17/2015  . Acute on chronic diastolic (congestive) heart failure (HCC)   . Pleural effusion 04/16/2015  . Fall 04/16/2015  . Chronic diastolic CHF (congestive heart failure) (HCC) 11/13/2014  . Chronic atrial fibrillation (HCC) 08/13/2014  . Acute diastolic CHF (congestive heart failure) (HCC) 05/19/2013  . Leukocytosis, unspecified 05/16/2013  . NSTEMI (non-ST elevated myocardial infarction) (HCC) 05/16/2013  . Mild aortic stenosis 08/06/2012  . Hypothyroidism 08/06/2012  . CAD (coronary artery disease)   . Unstable angina (HCC) 07/31/2012  . Atypical chest pain 03/06/2012  . Essential hypertension     Past Surgical History:  Procedure Laterality Date  . Breast biopsies    . CATARACT EXTRACTION    . CORONARY ANGIOPLASTY WITH STENT PLACEMENT  march 2014  . LEFT HEART CATHETERIZATION  WITH CORONARY ANGIOGRAM N/A 08/02/2012   Procedure: LEFT HEART CATHETERIZATION WITH CORONARY ANGIOGRAM;  Surgeon: Peter M SwazilandJordan, MD;  [facility];  Service: Cardiovascular;  Laterality: N/A;  . Left knee replacement    . PERCUTANEOUS CORONARY ROTOBLATOR INTERVENTION (PCI-R) N/A 08/05/2012   Procedure: PERCUTANEOUS CORONARY ROTOBLATOR INTERVENTION (PCI-R);  Surgeon: Peter M SwazilandJordan, MD;  [facility];  Service: Cardiovascular;  Laterality: N/A;    OB History    No data available     Home  Medications    Prior to Admission medications   Medication Sig Start Date End Date Taking? Authorizing Provider  acetaminophen (TYLENOL) 325 MG tablet Take 650 mg by mouth every 6 (six) hours as needed for pain.   Yes Historical Provider, MD  aspirin EC 81 MG tablet Take 1 tablet (81 mg total) by mouth daily. 08/12/15  Yes Peter M SwazilandJordan, MD  furosemide (LASIX) 40 MG tablet Take 1 tablet (40 mg total) by mouth daily. 05/19/13  Yes Belkys A Regalado, MD  levothyroxine (SYNTHROID, LEVOTHROID) 112 MCG tablet Take 112 mcg by mouth daily before breakfast.   Yes Historical Provider, MD  olmesartan (BENICAR) 20 MG tablet Take 20 mg by mouth daily.   Yes Historical Provider, MD  potassium chloride SA (K-DUR,KLOR-CON) 20 MEQ tablet Take 20 mEq by mouth daily.    Yes Historical Provider, MD  carvedilol (COREG) 25 MG tablet TAKE 1 TABLET TWICE A DAY WITH MEALS Patient not taking: Reported on 01/23/2016    Peter M SwazilandJordan, MD  nitroGLYCERIN (NITROSTAT) 0.4 MG SL tablet Place 1 tablet (0.4 mg total) under the tongue every 5 (five) minutes as needed for chest pain. Patient not taking: Reported on 01/23/2016 05/12/15   Peter M SwazilandJordan, MD    Family History Family History  Problem Relation Age of Onset  . Alzheimer's disease Brother   . Heart disease Father 4680    Social History Social History  Substance Use Topics  . Smoking status: Never Smoker  . Smokeless tobacco: Never Used  . Alcohol use No     Allergies   Aspirin and Amlodipine  Review of Systems Review of Systems  Respiratory: Negative for shortness of breath.   Cardiovascular: Negative for chest pain.  Musculoskeletal: Positive for arthralgias, gait problem and myalgias.  Neurological: Negative for syncope.  Hematological: Does not bruise/bleed easily.     Physical Exam Updated Vital Signs Pulse 74   Temp 97.6 F (36.4 C) (Oral)   Resp 20   SpO2 (S) (!) 87%  Vitals:   01/23/16 0300 01/23/16 0330 01/23/16 0400 01/23/16 0415  BP:  131/64 132/73 120/61   Pulse: 72 76 62 70  Resp: 18 21 11 15   Temp:      TempSrc:      SpO2: 96% 95% 94% 94%  Weight:        Physical Exam  Constitutional: She is oriented to person, place, and time. She appears well-developed and well-nourished.  Non-toxic appearance. She does not have a sickly appearance. She does not appear ill.  HENT:  Head: Normocephalic and atraumatic.  Mouth/Throat: Oropharynx is clear and moist.  Eyes: Conjunctivae are normal.  Neck: Normal range of motion. Neck supple.  No cervical midline TTP.   Cardiovascular: Normal rate and regular rhythm.   Pulses:      Dorsalis pedis pulses are 2+ on the right side, and 2+ on the left side.  Bilateral 1+ pitting edema.   Pulmonary/Chest: Effort normal and breath  sounds normal. No accessory muscle usage or stridor. No respiratory distress. She has no wheezes. She has no rhonchi. She has no rales.  Abdominal: Soft. Bowel sounds are normal. She exhibits no distension. There is no tenderness.  Musculoskeletal: Normal range of motion. She exhibits tenderness. She exhibits no deformity.  PMS intact.   Pelvis stable.  Right hip without bruising, swelling, or deformity.  TTP along posterior hip.  Patient refuses to perform ROM due to pain.  Exquisite pain with passive flexion and internal/external ROM.    Lymphadenopathy:    She has no cervical adenopathy.  Neurological: She is alert and oriented to person, place, and time. No cranial nerve deficit.  Speech clear without dysarthria. Cranial nerves grossly intact.  Equal strength/sensation in BUE. LLE normal.  RLE: Deferred strength testing due to pain. Sensation intact to light touch.   Skin: Skin is warm and dry.  Psychiatric: She has a normal mood and affect. Her behavior is normal.     ED Treatments / Results  DIAGNOSTIC STUDIES: Oxygen Saturation is 87% on RA, low by my interpretation.   Oxygen Saturation is 91% on 3L/min O2, low by my  interpretation.  COORDINATION OF CARE: 8:33 PM- Pt advised of plan for treatment which includes medication and radiology and pt agrees.  Labs (all labs ordered are listed, but only abnormal results are displayed) Labs Reviewed  BRAIN NATRIURETIC PEPTIDE - Abnormal; Notable for the following:       Result Value   B Natriuretic Peptide 259.9 (*)    All other components within normal limits  BASIC METABOLIC PANEL - Abnormal; Notable for the following:    Glucose, Bld 141 (*)    BUN 22 (*)    Calcium 8.7 (*)    GFR calc non Af Amer 48 (*)    GFR calc Af Amer 56 (*)    All other components within normal limits  CBC WITH DIFFERENTIAL/PLATELET - Abnormal; Notable for the following:    WBC 13.0 (*)    Platelets 137 (*)    Neutro Abs 11.6 (*)    All other components within normal limits  TROPONIN I - Abnormal; Notable for the following:    Troponin I 0.03 (*)    All other components within normal limits  URINALYSIS, ROUTINE W REFLEX MICROSCOPIC (NOT AT [facility]) - Abnormal; Notable for the following:    Leukocytes, UA SMALL (*)    All other components within normal limits  URINE MICROSCOPIC-ADD ON - Abnormal; Notable for the following:    Squamous Epithelial / LPF 0-5 (*)    Bacteria, UA RARE (*)    All other components within normal limits    EKG  EKG Interpretation  Date/Time:  Saturday January 22 2016 21:12:28 EDT Ventricular Rate:  90 PR Interval:    QRS Duration: 150 QT Interval:  425 QTC Calculation: 521 R Axis:   -38 Text Interpretation:  Atrial fibrillation Left bundle branch block Baseline wander in lead(s) V2 Confirmed by [facility]ARD[facility] MD, PEDRO ([id number]) on 01/23/2016 12:31:49 AM       Radiology Dg Chest 2 View  Result Date: 2015-10-25 CLINICAL DATA:  Fall.  Right hip pain.  No reported back pain. EXAM: CHEST  2 VIEW COMPARISON:  04/17/2015 FINDINGS: Bilateral pleural effusions also seen on prior exam. Mild pulmonary venous congestion. No convincing consolidation. No  pneumothorax. Chronic cardiomegaly. Mediastinal contours are distorted by rotation. No visible fracture. Exaggerated thoracic kyphosis. Cannot exclude lower thoracic compression deformities, limited by overlapping shadows,  but no reported back pain. IMPRESSION: Chronic bilateral pleural effusion, borderline moderate on the right. Pulmonary venous congestion. Electronically Signed   By: Marnee Spring M.D.   On: Jan 23, 2016 21:10   Ct Head Wo Contrast  Result Date: Jan 23, 2016 CLINICAL DATA:  Unwitnessed fall headache and neck pain. EXAM: CT HEAD WITHOUT CONTRAST CT CERVICAL SPINE WITHOUT CONTRAST TECHNIQUE: Multidetector CT imaging of the head and cervical spine was performed following the standard protocol without intravenous contrast. Multiplanar CT image reconstructions of the cervical spine were also generated. COMPARISON:  06/15/2014 FINDINGS: CT HEAD FINDINGS Generalized age related atrophy. Mild chronic small-vessel ischemic change of the cerebral hemispheric white matter. No sign of acute infarction, mass lesion, hemorrhage, hydrocephalus or extra-axial collection. No skull fracture. No fluid in the sinuses. There is atherosclerotic calcification of the major vessels at the base of the brain. CT CERVICAL SPINE FINDINGS No malalignment. No visible soft tissue swelling. No fracture. Chronic facet fusion on the right at C4-5. Mild degenerative changes above and below that. Nitrogen gas in the disc spaces of C5-6 and C6-7 is degenerative and was present on the previous study. Chronic thyroid goiter again demonstrated. Some pleural fluid bilaterally. IMPRESSION: Head CT: Atrophy and chronic small vessel ischemic change. No acute or traumatic finding. CT cervical spine: No acute or traumatic finding. Mild degenerative changes. Electronically Signed   By: Paulina Fusi M.D.   On: 2016-01-23 22:36   Ct Cervical Spine Wo Contrast  Result Date: 2016/01/23 CLINICAL DATA:  Unwitnessed fall headache and neck pain.  EXAM: CT HEAD WITHOUT CONTRAST CT CERVICAL SPINE WITHOUT CONTRAST TECHNIQUE: Multidetector CT imaging of the head and cervical spine was performed following the standard protocol without intravenous contrast. Multiplanar CT image reconstructions of the cervical spine were also generated. COMPARISON:  06/15/2014 FINDINGS: CT HEAD FINDINGS Generalized age related atrophy. Mild chronic small-vessel ischemic change of the cerebral hemispheric white matter. No sign of acute infarction, mass lesion, hemorrhage, hydrocephalus or extra-axial collection. No skull fracture. No fluid in the sinuses. There is atherosclerotic calcification of the major vessels at the base of the brain. CT CERVICAL SPINE FINDINGS No malalignment. No visible soft tissue swelling. No fracture. Chronic facet fusion on the right at C4-5. Mild degenerative changes above and below that. Nitrogen gas in the disc spaces of C5-6 and C6-7 is degenerative and was present on the previous study. Chronic thyroid goiter again demonstrated. Some pleural fluid bilaterally. IMPRESSION: Head CT: Atrophy and chronic small vessel ischemic change. No acute or traumatic finding. CT cervical spine: No acute or traumatic finding. Mild degenerative changes. Electronically Signed   By: Paulina Fusi M.D.   On: 01/23/16 22:36   Dg Hip Unilat W Or Wo Pelvis 2-3 Views Right  Result Date: 2016-01-23 CLINICAL DATA:  82 year old female status post unwitnessed fall, found down. Right hip pain. Initial encounter. EXAM: DG HIP (WITH OR WITHOUT PELVIS) 2-3V RIGHT COMPARISON:  None. FINDINGS: Right femoral neck fracture with varus impaction. The intertrochanteric region appears spared. The right femoral head remains located within the acetabulum. Pelvis appears intact. SI joints within normal limits. Grossly intact proximal left femur. Dystrophic appearing calcifications adjacent to the left greater trochanter. Iliac artery atherosclerosis that iliac artery calcified  atherosclerosis. IMPRESSION: Acute right femoral neck fracture with varus impaction. Electronically Signed   By: Odessa Fleming M.D.   On: 01/23/16 21:08    Procedures Procedures (including critical care time)  Medications Ordered in ED Medications  diltiazem (CARDIZEM) 1 mg/mL load via infusion 10  mg (10 mg Intravenous Bolus from Bag 01/23/16 0014)    And  diltiazem (CARDIZEM) 100 mg in dextrose 5% (1 mg/mL) infusion (5 mg/hr Intravenous Transfusing/Transfer 01/23/16 0426)  HYDROcodone-acetaminophen (NORCO/VICODIN) 5-325 MG per tablet 1-2 tablet (1 tablet Oral Given 01/23/16 0123)  methocarbamol (ROBAXIN) tablet 500 mg (500 mg Oral Given 01/23/16 0124)    Or  methocarbamol (ROBAXIN) 500 mg in dextrose 5 % 50 mL IVPB ( Intravenous See Alternative 01/23/16 0124)  fentaNYL (SUBLIMAZE) injection 50 mcg (50 mcg Intravenous Given 01/16/2016 2128)  HYDROmorphone (DILAUDID) injection 0.5 mg (0.5 mg Intravenous Given 01/28/2016 2300)  furosemide (LASIX) injection 40 mg (40 mg Intravenous Given 01/14/2016 2329)     Initial Impression / Assessment and Plan / ED Course  I have reviewed the triage vital signs and the nursing notes.  Pertinent labs & imaging results that were available during my care of the patient were reviewed by me and considered in my medical decision making (see chart for details).  Clinical Course   Patient presents for fall, now with R hip pain.  She has not been able to ambulate since the fall.  She takes ASA 81 mg daily.  Unsure of head injury, but denies LOC.  No cervical midline tenderness.  Right hip TTP with exquisite pain with PROM.  PMS intact.  She denies any CP or new SOB.  She is found to be hypoxic at 87% on RA; therefore, she was placed on 3L [place] with improvement.  She does not wear home oxygen.    CXR shows chronic b/l pleural effusions, boderline moderate on the right.  She is found to by mildly hypoxic on exam, now requiring oxygen.  EKG unchanged from previous, known hx of  chronic AF.    She is found to have a right femoral neck fracture as well.  Will add on labs.  Labs largely without acute abnormalities.  BNP 259.9.  Possible CHF exacerbation given mild hypoxia, b/l pleural effusions, R>L.  40 mg IV Lasix.   Spoke with orthopedic, Dr. August Saucer, will make NPO and transfer to [facility] for possible surgery tomorrow. Admit to medicine.  Accepting physician Dr. Montez Morita at [facility].   Case has been discussed with and seen by Dr. Eudelia Bunch who agrees with the above plan for admission.  Final Clinical Impressions(s) / ED Diagnoses   Final diagnoses:  Fall, initial encounter  Closed right hip fracture, initial encounter [facility])    New Prescriptions New Prescriptions   No medications on file   I personally performed the services described in this documentation, which was scribed in my presence. The recorded information has been reviewed and is accurate.     Cheri Fowler, PA-C 01/23/16 [phone]

## 2016-01-22 NOTE — ED Provider Notes (Signed)
Medical screening examination/treatment/procedure(s) were conducted as a shared visit with non-physician practitioner(s) and myself.  I personally evaluated the patient during the encounter. Briefly, the patient is a 82 y.o. female  who presents after an unwitnessed mechanical fall found to have right hip fracture. No other injuries noted on exam however chest x-ray did show chronic bilateral pleural effusions. Patient was mildly hypoxic requiring oxygen. EKG unchanged from previous. Patient was admitted for further management with orthopedic follow.   EKG Interpretation  Date/Time:  Saturday January 22 2016 21:12:28 EDT Ventricular Rate:  90 PR Interval:    QRS Duration: 150 QT Interval:  425 QTC Calculation: 521 R Axis:   -38 Text Interpretation:  Atrial fibrillation Left bundle branch block Baseline wander in lead(s) V2 Confirmed by [facility] MD, PEDRO ([id number]) on 01/23/2016 12:31:49 AM           Nira ConnPedro Eduardo Cardama, MD 01/23/16 [phone]

## 2016-01-22 NOTE — ED Notes (Addendum)
Pt's contacts:  Harmon DunJohn Gonyer (son)--- tel# [phone];  Sheliah Mendsnn Rolland (daughter)--- tel# [phone]

## 2016-01-23 ENCOUNTER — Inpatient Hospital Stay (HOSPITAL_COMMUNITY): Payer: Medicare Other

## 2016-01-23 ENCOUNTER — Encounter (HOSPITAL_COMMUNITY): Payer: Self-pay | Admitting: *Deleted

## 2016-01-23 DIAGNOSIS — I482 Chronic atrial fibrillation: Secondary | ICD-10-CM

## 2016-01-23 DIAGNOSIS — I10 Essential (primary) hypertension: Secondary | ICD-10-CM

## 2016-01-23 DIAGNOSIS — I2510 Atherosclerotic heart disease of native coronary artery without angina pectoris: Secondary | ICD-10-CM

## 2016-01-23 DIAGNOSIS — Z01810 Encounter for preprocedural cardiovascular examination: Secondary | ICD-10-CM

## 2016-01-23 LAB — COMPREHENSIVE METABOLIC PANEL
ALT: 18 U/L (ref 14–54)
AST: 25 U/L (ref 15–41)
Albumin: 3.4 g/dL — ABNORMAL LOW (ref 3.5–5.0)
Alkaline Phosphatase: 104 U/L (ref 38–126)
Anion gap: 13 (ref 5–15)
BUN: 20 mg/dL (ref 6–20)
CHLORIDE: 102 mmol/L (ref 101–111)
CO2: 25 mmol/L (ref 22–32)
CREATININE: 1.13 mg/dL — AB (ref 0.44–1.00)
Calcium: 9.2 mg/dL (ref 8.9–10.3)
GFR calc Af Amer: 46 mL/min — ABNORMAL LOW (ref >60)
GFR, EST NON AFRICAN AMERICAN: 39 mL/min — AB (ref >60)
Glucose, Bld: 162 mg/dL — ABNORMAL HIGH (ref 65–99)
Potassium: 3.9 mmol/L (ref 3.5–5.1)
Sodium: 140 mmol/L (ref 135–145)
Total Bilirubin: 1.2 mg/dL (ref 0.3–1.2)
Total Protein: 6.1 g/dL — ABNORMAL LOW (ref 6.5–8.1)

## 2016-01-23 LAB — URINALYSIS, ROUTINE W REFLEX MICROSCOPIC
Bilirubin Urine: NEGATIVE
GLUCOSE, UA: NEGATIVE mg/dL
HGB URINE DIPSTICK: NEGATIVE
Ketones, ur: NEGATIVE mg/dL
Nitrite: NEGATIVE
PH: 5.5 (ref 5.0–8.0)
PROTEIN: NEGATIVE mg/dL
Specific Gravity, Urine: 1.017 (ref 1.005–1.030)

## 2016-01-23 LAB — CBC
HCT: 44.5 % (ref 36.0–46.0)
Hemoglobin: 14.3 g/dL (ref 12.0–15.0)
MCH: 30.4 pg (ref 26.0–34.0)
MCHC: 32.1 g/dL (ref 30.0–36.0)
MCV: 94.7 fL (ref 78.0–100.0)
PLATELETS: 146 10*3/uL — AB (ref 150–400)
RBC: 4.7 MIL/uL (ref 3.87–5.11)
RDW: 13.8 % (ref 11.5–15.5)
WBC: 11.4 10*3/uL — AB (ref 4.0–10.5)

## 2016-01-23 LAB — URINE MICROSCOPIC-ADD ON

## 2016-01-23 LAB — ABO/RH: ABO/RH(D): O POS

## 2016-01-23 LAB — MAGNESIUM: Magnesium: 2.1 mg/dL (ref 1.7–2.4)

## 2016-01-23 LAB — SURGICAL PCR SCREEN
MRSA, PCR: NEGATIVE
Staphylococcus aureus: NEGATIVE

## 2016-01-23 LAB — TYPE AND SCREEN
ABO/RH(D): O POS
Antibody Screen: NEGATIVE

## 2016-01-23 LAB — TROPONIN I: Troponin I: 0.07 ng/mL (ref <0.03)

## 2016-01-23 LAB — MRSA PCR SCREENING: MRSA by PCR: NEGATIVE

## 2016-01-23 LAB — TSH: TSH: 1.34 u[IU]/mL (ref 0.350–4.500)

## 2016-01-23 LAB — PHOSPHORUS: Phosphorus: 4.2 mg/dL (ref 2.5–4.6)

## 2016-01-23 MED ORDER — HYDRALAZINE HCL 20 MG/ML IJ SOLN
5.0000 mg | INTRAMUSCULAR | Status: DC | PRN
  Administered 2016-01-23: 5 mg via INTRAVENOUS
  Filled 2016-01-23: qty 1

## 2016-01-23 MED ORDER — FUROSEMIDE 10 MG/ML IJ SOLN
40.0000 mg | Freq: Two times a day (BID) | INTRAMUSCULAR | Status: DC
  Administered 2016-01-23 – 2016-01-25 (×4): 40 mg via INTRAVENOUS
  Filled 2016-01-23 (×5): qty 4

## 2016-01-23 MED ORDER — DEXTROSE 5 % IV SOLN
500.0000 mg | Freq: Four times a day (QID) | INTRAVENOUS | Status: DC | PRN
  Filled 2016-01-23: qty 5

## 2016-01-23 MED ORDER — CARVEDILOL 12.5 MG PO TABS
12.5000 mg | ORAL_TABLET | Freq: Two times a day (BID) | ORAL | Status: DC
  Administered 2016-01-23 – 2016-01-25 (×4): 12.5 mg via ORAL
  Filled 2016-01-23 (×7): qty 1

## 2016-01-23 MED ORDER — ACETAMINOPHEN 325 MG PO TABS: 650.0000 mg | ORAL_TABLET | Freq: Four times a day (QID) | ORAL | Status: DC | PRN

## 2016-01-23 MED ORDER — FENTANYL CITRATE (PF) 100 MCG/2ML IJ SOLN
INTRAMUSCULAR | Status: AC
  Filled 2016-01-23: qty 2

## 2016-01-23 MED ORDER — ONDANSETRON HCL 4 MG PO TABS: 4.0000 mg | ORAL_TABLET | Freq: Four times a day (QID) | ORAL | Status: DC | PRN

## 2016-01-23 MED ORDER — METHOCARBAMOL 500 MG PO TABS
500.0000 mg | ORAL_TABLET | Freq: Four times a day (QID) | ORAL | Status: DC | PRN
  Administered 2016-01-23: 500 mg via ORAL
  Filled 2016-01-23: qty 1

## 2016-01-23 MED ORDER — LIDOCAINE 2% (20 MG/ML) 5 ML SYRINGE
INTRAMUSCULAR | Status: AC
  Filled 2016-01-23: qty 5

## 2016-01-23 MED ORDER — IRBESARTAN 150 MG PO TABS
150.0000 mg | ORAL_TABLET | Freq: Every day | ORAL | Status: DC
  Administered 2016-01-24: 150 mg via ORAL
  Filled 2016-01-23 (×4): qty 1

## 2016-01-23 MED ORDER — HYDROCODONE-ACETAMINOPHEN 5-325 MG PO TABS
1.0000 | ORAL_TABLET | ORAL | Status: DC | PRN
  Administered 2016-01-23: 2 via ORAL
  Administered 2016-01-23: 1 via ORAL
  Administered 2016-01-24: 2 via ORAL
  Filled 2016-01-23: qty 1
  Filled 2016-01-23 (×2): qty 2

## 2016-01-23 MED ORDER — ACETAMINOPHEN 650 MG RE SUPP: 650.0000 mg | Freq: Four times a day (QID) | RECTAL | Status: DC | PRN

## 2016-01-23 MED ORDER — SODIUM CHLORIDE 0.9% FLUSH: 3.0000 mL | INTRAVENOUS | Status: DC | PRN

## 2016-01-23 MED ORDER — BISACODYL 10 MG RE SUPP: 10.0000 mg | Freq: Every day | RECTAL | Status: DC | PRN

## 2016-01-23 MED ORDER — ONDANSETRON HCL 4 MG/2ML IJ SOLN
4.0000 mg | Freq: Four times a day (QID) | INTRAMUSCULAR | Status: DC | PRN
  Administered 2016-01-23 – 2016-01-24 (×2): 4 mg via INTRAVENOUS
  Filled 2016-01-23: qty 2

## 2016-01-23 MED ORDER — LEVOTHYROXINE SODIUM 100 MCG IV SOLR
50.0000 ug | Freq: Every day | INTRAVENOUS | Status: DC
  Administered 2016-01-23 – 2016-01-24 (×2): 50 ug via INTRAVENOUS
  Filled 2016-01-23 (×2): qty 5

## 2016-01-23 MED ORDER — SODIUM CHLORIDE 0.9% FLUSH
3.0000 mL | Freq: Two times a day (BID) | INTRAVENOUS | Status: DC
  Administered 2016-01-24: 3 mL via INTRAVENOUS

## 2016-01-23 MED ORDER — SODIUM CHLORIDE 0.9 % IV SOLN: 250.0000 mL | INTRAVENOUS | Status: DC | PRN

## 2016-01-23 MED ORDER — SUCCINYLCHOLINE CHLORIDE 200 MG/10ML IV SOSY
PREFILLED_SYRINGE | INTRAVENOUS | Status: AC
  Filled 2016-01-23: qty 10

## 2016-01-23 MED ORDER — POLYETHYLENE GLYCOL 3350 17 G PO PACK: 17.0000 g | PACK | Freq: Every day | ORAL | Status: DC | PRN

## 2016-01-23 MED ORDER — SENNA 8.6 MG PO TABS
1.0000 | ORAL_TABLET | Freq: Two times a day (BID) | ORAL | Status: DC
  Administered 2016-01-23 – 2016-01-24 (×4): 8.6 mg via ORAL
  Filled 2016-01-23 (×5): qty 1

## 2016-01-23 MED ORDER — 0.9 % SODIUM CHLORIDE (POUR BTL) OPTIME
TOPICAL | Status: DC | PRN
  Administered 2016-01-23 – 2016-01-24 (×2): 1000 mL

## 2016-01-23 MED ORDER — ONDANSETRON HCL 4 MG/2ML IJ SOLN
INTRAMUSCULAR | Status: AC
  Filled 2016-01-23: qty 2

## 2016-01-23 MED ORDER — MORPHINE SULFATE (PF) 2 MG/ML IV SOLN: 0.5000 mg | INTRAVENOUS | Status: DC | PRN

## 2016-01-23 MED ORDER — SODIUM CHLORIDE 0.9% FLUSH
3.0000 mL | Freq: Two times a day (BID) | INTRAVENOUS | Status: DC
  Administered 2016-01-23 – 2016-01-26 (×4): 3 mL via INTRAVENOUS
  Administered 2016-01-27: 10 mL via INTRAVENOUS
  Administered 2016-01-28 – 2016-01-29 (×4): 3 mL via INTRAVENOUS

## 2016-01-23 NOTE — Progress Notes (Addendum)
Patient ID: Jacqueline GableMary E [name], female   DOB: 12/27/1916, 82 y.o.   MRN: [id number]                                                                PROGRESS NOTE                                                                                                                                                                                                             Patient Demographics:    Jacqueline StallMary [name], is a 82 y.o. female, DOB - 05/09/1917, [id number]  Admit date - January 12, 2016   Admitting Physician Therisa DoyneAnastassia Doutova, MD  Outpatient Primary MD for the patient is Pamelia HoitWILSON,FRED HENRY, MD  LOS - 1  Outpatient Specialists:  Peter SwazilandJordan (cardiology),   Chief Complaint  Patient presents with  . Fall  . Hip Pain       Brief Narrative   82 y.o. female with medical history significant of DM, diastolic heart failure, gout, HTN, A. fib not on anticoagulation, hypothyroidism, allergic stenosis mild per echogram in 2011, pulmonary hypertension  Chronic pleural effusion, non-ST elevated MI in 2014  Presented with mechanical fall the patient was trying to reach for her wall car and found because her feet get tripped up. She fell on the right side. Call for help. Denies LOC. Patient was not sure she had her head or not. She is on aspirin for history of atrial fibrillation not on anticoagulation unable to ambulate after the fall did see the thickened right hip pain EMS was called she was given 50 g of fentanyl. Patient denies any shortness of breath no syncope no chest pain she is not on oxygen at her baseline Regarding pertinent Chronic problems: Patient is known history of atrial fibrillation but her anticoagulation has been stopped secondary to fall risk. Patient is known history of coronary artery disease last cardiac catheterization was in February 2014   she required DES EF 65-70 percent she does have diastolic heart failure. She is on Lasix 40 mg a day  In November patient had to be admitted for mechanical  fall she was found to have bilateral pleural effusions she has undergone thoracentesis and diuresis to 4.5 L dry weight was 167 pounds since then stabilized up to 174 pounds   IN ER:  Temp (24hrs Max:97.6 F (36.4 C)  hypoxic down to 87% on room air was started on oxygen heart up to 4 L.  heart rate 74 on arrival blood pressure 178/98 she did had one reading of heart rate is high as 149     BNP 259 which is below reading 2016 S2 78   sodium 137 BUN 22 creatinine 0.96 WBC 13 hemoglobin 14.4 platelets 137 Plain films showed right hip femoral neck fracture CT head and neck negative Chest x-ray showing worsening pleural effusions and pulmonary venous congestion Following Medications were ordered in ER: Medications  furosemide (LASIX) injection 40 mg (not administered)  fentaNYL (SUBLIMAZE) injection 50 mcg (50 mcg Intravenous Given 01/04/2016 2128)  HYDROmorphone (DILAUDID) injection 0.5 mg (0.5 mg Intravenous Given 01/15/2016 2300)     ER provider discussed case witDr. August Saucerean with orthopedics recommends admission to [facility] nothing by mouth post midnight for tomorrow    Subjective:    Jacqueline StallMary [name] today has slight right hip pain.  Pt breathing has improved per pt.  Denies cp, palp, n/v, diarrhea, brbpr, black stool.     Assessment  & Plan :    Active Problems:   Essential hypertension   CAD (coronary artery disease)   Chronic atrial fibrillation (HCC)   Pleural effusion   Hypoxia   Acute on chronic diastolic (congestive) heart failure (HCC)   Moderate pulmonary hypertension (HCC)   Closed displaced fracture of right femoral neck (HCC)   Closed right hip fracture (HCC)   Atrial fibrillation with RVR (HCC)   Closed right hip fracture  Appreciate orthopedic input.  Pt is still not certain whether she wishes to proceed with surgery.    Acute on chronic diastolic chf Cont tele Cont lasix 40mg  iv bid Cont carvedilol Pt was started on avapro, monitor  creatinine Check daily weight, strict I and o.  Cardiac echo   Pafib Chads2vasc=6 Not on anticoagulation due to falls cardizem gtt discontinued Cont carvedilol  Pleural effusion Cont lasix iv Repeat CXR tomorrow  Moderate pulmonary htn Stable  Hyperglycemia Check hga1c in am   Code Status : FULL CODE  Family Communication  :   Disposition Plan  :  ? SNF  Barriers For Discharge :   Consults  :  Cardiology, orthopedics  Procedures  :   DVT Prophylaxis  :  Lovenox - SCDs   Lab Results  Component Value Date   PLT 146 (L) 01/23/2016    Antibiotics  :    Anti-infectives    None        Objective:   Vitals:   01/23/16 0532 01/23/16 0640 01/23/16 0801 01/23/16 1048  BP:   (!) 160/70 119/66  Pulse:  78 66 62  Resp:  18 14   Temp: 98.7 F (37.1 C) 98.7 F (37.1 C) 98 F (36.7 C)   TempSrc: Oral Oral Oral   SpO2:  95% 97%   Weight: 79.8 kg (175 lb 14.8 oz) 79.2 kg (174 lb 9.7 oz)    Height: 5\' 4"  (1.626 m) 5\' 4"  (1.626 m)      Wt Readings from Last 3 Encounters:  01/23/16 79.2 kg (174 lb 9.7 oz)  12/13/15 78.8 kg (173 lb 12.8 oz)  08/12/15 84.6 kg (186 lb 8 oz)     Intake/Output Summary (Last 24 hours) at 01/23/16 1135 Last data filed at 01/23/16 1000  Gross per 24 hour  Intake  20 ml  Output                0 ml  Net               20 ml     Physical Exam  Awake Alert, Oriented X 3, No new F.N deficits, Normal affect [place].AT,PERRAL Supple Neck,No JVD, No cervical lymphadenopathy appriciated.  Symmetrical Chest wall movement, Good air movement bilaterally, slight decrease in bs at bil base, no wheeze, no crackle.  Irr, Irr, s1, s2, ,No Gallops,Rubs or new Murmurs, No Parasternal Heave +ve B.Sounds, Abd Soft, No tenderness, No organomegaly appriciated, No rebound - guarding or rigidity. No Cyanosis, Clubbing or edema, No new Rash or bruise      Data Review:    CBC  Recent Labs Lab 01/09/2016 2129 01/23/16 0553  WBC 13.0*  11.4*  HGB 14.4 14.3  HCT 43.4 44.5  PLT 137* 146*  MCV 92.1 94.7  MCH 30.6 30.4  MCHC 33.2 32.1  RDW 13.9 13.8  LYMPHSABS 0.7  --   MONOABS 0.5  --   EOSABS 0.1  --   BASOSABS 0.0  --     Chemistries   Recent Labs Lab 02/01/2016 2129 01/23/16 0553  NA 137 140  K 3.9 3.9  CL 106 102  CO2 23 25  GLUCOSE 141* 162*  BUN 22* 20  CREATININE 0.96 1.13*  CALCIUM 8.7* 9.2  MG  --  2.1  AST  --  25  ALT  --  18  ALKPHOS  --  104  BILITOT  --  1.2   ------------------------------------------------------------------------------------------------------------------ No results for input(s): CHOL, HDL, LDLCALC, TRIG, CHOLHDL, LDLDIRECT in the last 72 hours.  Lab Results  Component Value Date   HGBA1C 5.9 (H) 07/31/2012   ------------------------------------------------------------------------------------------------------------------  Recent Labs  01/23/16 0553  TSH 1.340   ------------------------------------------------------------------------------------------------------------------ No results for input(s): VITAMINB12, FOLATE, FERRITIN, TIBC, IRON, RETICCTPCT in the last 72 hours.  Coagulation profile No results for input(s): INR, PROTIME in the last 168 hours.  No results for input(s): DDIMER in the last 72 hours.  Cardiac Enzymes  Recent Labs Lab 01/29/2016 2301 01/23/16 0553  TROPONINI 0.03* 0.07*   ------------------------------------------------------------------------------------------------------------------    Component Value Date/Time   BNP 259.9 (H) 01/05/2016 2129    Inpatient Medications  Scheduled Meds: . carvedilol  12.5 mg Oral BID WC  . furosemide  40 mg Intravenous Q12H  . [START ON 02/01/2016] irbesartan  150 mg Oral Daily  . levothyroxine  50 mcg Intravenous Daily  . senna  1 tablet Oral BID  . sodium chloride flush  3 mL Intravenous Q12H  . sodium chloride flush  3 mL Intravenous Q12H   Continuous Infusions:  PRN Meds:.sodium  chloride, acetaminophen **OR** acetaminophen, bisacodyl, HYDROcodone-acetaminophen, methocarbamol **OR** methocarbamol (ROBAXIN)  IV, morphine injection, ondansetron **OR** ondansetron (ZOFRAN) IV, polyethylene glycol, sodium chloride flush  Micro Results Recent Results (from the past 240 hour(s))  MRSA PCR Screening     Status: None   Collection Time: 01/23/16  5:40 AM  Result Value Ref Range Status   MRSA by PCR NEGATIVE NEGATIVE Final    Comment:        The GeneXpert MRSA Assay (FDA approved for NASAL specimens only), is one component of a comprehensive MRSA colonization surveillance program. It is not intended to diagnose MRSA infection nor to guide or monitor treatment for MRSA infections.     Radiology Reports Dg Chest 2 View  Result Date: 01/11/2016 CLINICAL DATA:  Fall.  Right hip pain.  No reported back pain. EXAM: CHEST  2 VIEW COMPARISON:  04/17/2015 FINDINGS: Bilateral pleural effusions also seen on prior exam. Mild pulmonary venous congestion. No convincing consolidation. No pneumothorax. Chronic cardiomegaly. Mediastinal contours are distorted by rotation. No visible fracture. Exaggerated thoracic kyphosis. Cannot exclude lower thoracic compression deformities, limited by overlapping shadows, but no reported back pain. IMPRESSION: Chronic bilateral pleural effusion, borderline moderate on the right. Pulmonary venous congestion. Electronically Signed   By: Marnee Spring M.D.   On: January 25, 2016 21:10   Ct Head Wo Contrast  Result Date: 01/23/2016 CLINICAL DATA:  Unwitnessed fall headache and neck pain. EXAM: CT HEAD WITHOUT CONTRAST CT CERVICAL SPINE WITHOUT CONTRAST TECHNIQUE: Multidetector CT imaging of the head and cervical spine was performed following the standard protocol without intravenous contrast. Multiplanar CT image reconstructions of the cervical spine were also generated. COMPARISON:  06/15/2014 FINDINGS: CT HEAD FINDINGS Generalized age related atrophy. Mild  chronic small-vessel ischemic change of the cerebral hemispheric white matter. No sign of acute infarction, mass lesion, hemorrhage, hydrocephalus or extra-axial collection. No skull fracture. No fluid in the sinuses. There is atherosclerotic calcification of the major vessels at the base of the brain. CT CERVICAL SPINE FINDINGS No malalignment. No visible soft tissue swelling. No fracture. Chronic facet fusion on the right at C4-5. Mild degenerative changes above and below that. Nitrogen gas in the disc spaces of C5-6 and C6-7 is degenerative and was present on the previous study. Chronic thyroid goiter again demonstrated. Some pleural fluid bilaterally. IMPRESSION: Head CT: Atrophy and chronic small vessel ischemic change. No acute or traumatic finding. CT cervical spine: No acute or traumatic finding. Mild degenerative changes. Electronically Signed   By: Paulina Fusi M.D.   On: 01/05/2016 22:36   Ct Cervical Spine Wo Contrast  Result Date: 01/20/2016 CLINICAL DATA:  Unwitnessed fall headache and neck pain. EXAM: CT HEAD WITHOUT CONTRAST CT CERVICAL SPINE WITHOUT CONTRAST TECHNIQUE: Multidetector CT imaging of the head and cervical spine was performed following the standard protocol without intravenous contrast. Multiplanar CT image reconstructions of the cervical spine were also generated. COMPARISON:  06/15/2014 FINDINGS: CT HEAD FINDINGS Generalized age related atrophy. Mild chronic small-vessel ischemic change of the cerebral hemispheric white matter. No sign of acute infarction, mass lesion, hemorrhage, hydrocephalus or extra-axial collection. No skull fracture. No fluid in the sinuses. There is atherosclerotic calcification of the major vessels at the base of the brain. CT CERVICAL SPINE FINDINGS No malalignment. No visible soft tissue swelling. No fracture. Chronic facet fusion on the right at C4-5. Mild degenerative changes above and below that. Nitrogen gas in the disc spaces of C5-6 and C6-7 is  degenerative and was present on the previous study. Chronic thyroid goiter again demonstrated. Some pleural fluid bilaterally. IMPRESSION: Head CT: Atrophy and chronic small vessel ischemic change. No acute or traumatic finding. CT cervical spine: No acute or traumatic finding. Mild degenerative changes. Electronically Signed   By: Paulina Fusi M.D.   On: 01/22/2016 22:36   Dg Hip Unilat W Or Wo Pelvis 2-3 Views Right  Result Date: 01/17/2016 CLINICAL DATA:  82 year old female status post unwitnessed fall, found down. Right hip pain. Initial encounter. EXAM: DG HIP (WITH OR WITHOUT PELVIS) 2-3V RIGHT COMPARISON:  None. FINDINGS: Right femoral neck fracture with varus impaction. The intertrochanteric region appears spared. The right femoral head remains located within the acetabulum. Pelvis appears intact. SI joints within normal limits. Grossly intact proximal left femur. Dystrophic appearing calcifications adjacent  to the left greater trochanter. Iliac artery atherosclerosis that iliac artery calcified atherosclerosis. IMPRESSION: Acute right femoral neck fracture with varus impaction. Electronically Signed   By: Odessa Fleming M.D.   On: 02/02/2016 21:08    Time Spent in minutes  30   Pearson Grippe M.D on 01/23/2016 at 11:35 AM  Between 7am to 7pm - Pager - [phone]  After 7pm go to [email or web address] - password [facility]  [facility] -  Office  [phone]

## 2016-01-23 NOTE — ED Notes (Signed)
Pt's daughter,  Dewayne Hatchnn, was called and made aware of pt's condition, hospital admission [facility]; attempt to contact pt's son, Jonny RuizJohn, was unsuccessful.

## 2016-01-23 NOTE — Progress Notes (Signed)
Patient ID: Jacqueline GableMary E Zhong, female   DOB: 06/03/1917, 82 y.o.   MRN: [id number] Given the patient's pulmonary edema, we will need to hold on surgery today until she can hopefully be more medically optimized. I spoke with her son about this at the bedside.  Will put her on the add-on schedule for 5pm tomorrow 8/21.

## 2016-01-23 NOTE — Progress Notes (Signed)
Pt. Started coughing  Out thick sputum, mouth suctioned obtained mod amount of white thick sputum, sched lasix 40 mg iv given. E-link  Md made aware with order. Cardiac fellow came to see pt. Portable chest- ray done. awaiting result. Placed on 30 % venturi mask sat at-96 %. Hydralazine iv given for bp 186/99. Went to sleep, with no bouts of cough noted so far. Cont. To monitor.

## 2016-01-23 NOTE — Anesthesia Preprocedure Evaluation (Addendum)
Anesthesia Evaluation  Patient identified by MRN, date of birth, ID band Patient awake    Reviewed: Allergy & Precautions, H&P , NPO status , Patient's Chart, lab work & pertinent test results, reviewed documented beta blocker date and time   History of Anesthesia Complications Negative for: history of anesthetic complications  Airway Mallampati: III  TM Distance: >3 FB Neck ROM: Full    Dental no notable dental hx. (+) Teeth Intact, Dental Advisory Given   Pulmonary shortness of breath,  Bilateral pleural effusions   Pulmonary exam normal breath sounds clear to auscultation       Cardiovascular hypertension, Pt. on medications and Pt. on home beta blockers + CAD, + Past MI, + Cardiac Stents and +CHF  + dysrhythmias Atrial Fibrillation + Valvular Problems/Murmurs AS  Rhythm:Regular Rate:Normal  EF 60-65%. Mild AS   Neuro/Psych negative neurological ROS  negative psych ROS   GI/Hepatic negative GI ROS, Neg liver ROS,   Endo/Other  diabetesHypothyroidism   Renal/GU Renal InsufficiencyRenal disease  negative genitourinary   Musculoskeletal   Abdominal   Peds  Hematology negative hematology ROS (+)   Anesthesia Other Findings   Reproductive/Obstetrics negative OB ROS                          Lab Results  Component Value Date   WBC 11.4 (H) 01/23/2016   HGB 14.3 01/23/2016   HCT 44.5 01/23/2016   MCV 94.7 01/23/2016   PLT 146 (L) 01/23/2016   Lab Results  Component Value Date   CREATININE 1.13 (H) 01/23/2016   BUN 20 01/23/2016   NA 140 01/23/2016   K 3.9 01/23/2016   CL 102 01/23/2016   CO2 25 01/23/2016    Anesthesia Physical Anesthesia Plan  ASA: III  Anesthesia Plan: General   Post-op Pain Management:    Induction: Intravenous  Airway Management Planned: Oral ETT  Additional Equipment:   Intra-op Plan:   Post-operative Plan: Extubation in OR  Informed Consent: I  have reviewed the patients History and Physical, chart, labs and discussed the procedure including the risks, benefits and alternatives for the proposed anesthesia with the patient or authorized representative who has indicated his/her understanding and acceptance.   Dental advisory given  Plan Discussed with: CRNA  Anesthesia Plan Comments:        Anesthesia Quick Evaluation

## 2016-01-23 NOTE — Consult Note (Signed)
CARDIOLOGY CONSULT NOTE     Primary Care Physician: Pamelia HoitWILSON,FRED HENRY, MD Referring Physician:  Dr Adela Glimpseoutova Primary Cardiologist:  Dr SwazilandJordan  Admit Date: 01/28/2016  Reason for consultation:  Preoperative clerance  Norval GableMary E Vitullo is a 82 y.o. female with a h/o prior CAD, permanent atrial fibrillation, HTN, DM, and diastolic dysfunction who is admitted with mechanical fall and hip fracture for which she is being considered for surgery.  She is well known to Dr SwazilandJordan and saw him most recently 12/13/15 (note reviewed).  She seems reasonably active for her age but is quite unsteady at times.  She had recent echo 10/16 which revealed preserved EF with mild AS.  She has permanent afib but is felt to be a poor candidate for anticoagulation due to falls.  She has chronic SOB due to pleural effusions.  She also has chronic edema.  These previously improved with lasix however she stopped taking lasix because she felt that she was "peeing too much".   Today, she denies symptoms of palpitations, chest pain,  orthopnea, PND, dizziness, presyncope, syncope, or neurologic sequela. The patient is tolerating medications without difficulties and is otherwise without complaint today.   Past Medical History:  Diagnosis Date  . Bilateral leg weakness    chronic  . CAD (coronary artery disease)    a. 2-08/2012: Cath/PCI: LM 10-20d, LAD 60-70p, 7617m (Rotablator->2.5x32 Promus Premier DES), D1 70-80ost, D2- small-90 ost/95p, LCX large, OM1 40p, OM2 small 80-90, RCA dominant 937m, Ef 65-70%.  . Diabetes mellitus   . Diastolic dysfunction   . Gout   . Hyperlipidemia   . Hypertension   . Hypothyroidism   . Mild aortic stenosis    a. 01/2010 Echo: Nl EF, mild AS/AI, mild LAE, mild PAH.  Marland Kitchen. Permanent atrial fibrillation (HCC)    chads2vasc score is at least 6.  not a candidate for anticoagulation due to falls  . Shortness of breath    with chf & lying flat   Past Surgical History:  Procedure Laterality Date  .  Breast biopsies    . CATARACT EXTRACTION    . CORONARY ANGIOPLASTY WITH STENT PLACEMENT  march 2014  . LEFT HEART CATHETERIZATION WITH CORONARY ANGIOGRAM N/A 08/02/2012   Procedure: LEFT HEART CATHETERIZATION WITH CORONARY ANGIOGRAM;  Surgeon: Peter M SwazilandJordan, MD;  [facility];  Service: Cardiovascular;  Laterality: N/A;  . Left knee replacement    . PERCUTANEOUS CORONARY ROTOBLATOR INTERVENTION (PCI-R) N/A 08/05/2012   Procedure: PERCUTANEOUS CORONARY ROTOBLATOR INTERVENTION (PCI-R);  Surgeon: Peter M SwazilandJordan, MD;  [facility];  Service: Cardiovascular;  Laterality: N/A;    . furosemide  40 mg Intravenous Q12H  . [START ON 01/23/2016] irbesartan  150 mg Oral Daily  . levothyroxine  50 mcg Intravenous Daily  . senna  1 tablet Oral BID  . sodium chloride flush  3 mL Intravenous Q12H  . sodium chloride flush  3 mL Intravenous Q12H   . diltiazem (CARDIZEM) infusion 5 mg/hr (01/23/16 0013)    Allergies  Allergen Reactions  . Aspirin     Pt states she cannot have aspirin due to Eliquis  . Amlodipine Swelling    Social History   Social History  . Marital status: Widowed    Spouse name: N/A  . Number of children: 2  . Years of education: N/A   Occupational History  . Not on file.   Social History Main Topics  . Smoking status: Never Smoker  . Smokeless tobacco: Never Used  .  Alcohol use No  . Drug use: No  . Sexual activity: Not Currently   Other Topics Concern  . Not on file   Social History Narrative   Lives in assisted living alone.      Family History  Problem Relation Age of Onset  . Alzheimer's disease Brother   . Heart disease Father 66    ROS- All systems are reviewed and negative except as per the HPI above  Physical Exam: Telemetry: rate controlled afib Vitals:   01/23/16 0415 01/23/16 0532 01/23/16 0640 01/23/16 0801  BP:    (!) 160/70  Pulse: 70  78 66  Resp: 15  18 14   Temp:  98.7 F (37.1 C) 98.7 F (37.1 C) 98 F (36.7 C)    TempSrc:  Oral Oral Oral  SpO2: 94%  95% 97%  Weight:  175 lb 14.8 oz (79.8 kg) 174 lb 9.7 oz (79.2 kg)   Height:  5\' 4"  (1.626 m) 5\' 4"  (1.626 m)     GEN- The patient is elderly appearing, alert and oriented x 3 today but requires additional time for processing Head- normocephalic, atraumatic Eyes-  Sclera clear, conjunctiva pink Ears- hearing intact Oropharynx- clear Neck- supple, + JVD Lungs- decreased BS at R base, normal work of breathing Heart- irregular rate and rhythm, 2/6 SEM LUSB which is early peaking GI- soft, NT, ND, + BS Extremities- no clubbing, cyanosis, +2 edema MS- diffuse muscle atrophy, + hip fracture Skin- no rash or lesion Psych- euthymic mood, full affect Neuro- strength and sensation are intact  EKG reveals afib with LBBB 90 bpm  Labs:   Lab Results  Component Value Date   WBC 11.4 (H) 01/23/2016   HGB 14.3 01/23/2016   HCT 44.5 01/23/2016   MCV 94.7 01/23/2016   PLT 146 (L) 01/23/2016    Recent Labs Lab 01/23/16 0553  NA 140  K 3.9  CL 102  CO2 25  BUN 20  CREATININE 1.13*  CALCIUM 9.2  PROT 6.1*  BILITOT 1.2  ALKPHOS 104  ALT 18  AST 25  GLUCOSE 162*   Lab Results  Component Value Date   TROPONINI 0.07 (HH) 01/23/2016   No results found for: CHOL No results found for: HDL No results found for: LDLCALC No results found for: TRIG No results found for: CHOLHDL No results found for: LDLDIRECT    Radiology: reviewed  Echo 10/16 reviewed as above  ASSESSMENT AND PLAN:   1. Preoperative clearance Pt has h/o CAD but without active symptoms of ischemia.  She has diastolic dysfunction with preserved EF.  She has rate controlled afib.  She has mild AS. Given her advanced age and comorbidities, she will be high risk for surgery.  I do not feel that further CV evaluation would help to remediate these surgical risks.  I would not advise any further additional CV testing at this time. If medically necessary, proceed with surgery.  May  require gentle diuresis as she is volume overloaded at this time and perioperative beta blocker therapy.  2. Permanent atrial fibrillation chads2vasc score is 6 Given frequent falls, she is not a candidate for anticoagulation long term Would stop IV diltiazem Resume home carvediolol and use metoprolol prn for HR sustained > 100 bpm  3. CAD No ischemic symptoms No further workup as above Resume ASA when able Perioperative beta blocker therapy  4. HTN Resume coreg and home medicines as able  5. Mild AS No further workup or management   General  cardiology to follow along Please call with questions    Hillis RangeJames Mickenzie Stolar, MD 01/23/2016  10:11 AM

## 2016-01-23 NOTE — Progress Notes (Signed)
Primary md made aware about the pt. having pulm. edema this pm.no order given.

## 2016-01-23 NOTE — ED Notes (Signed)
[facility]--- Rm 2H20C

## 2016-01-23 NOTE — Consult Note (Signed)
Reason for Consult: Right hip pain Referring Physician: [name] is an 82 y.o. female.  HPI: Jacqueline [name] is a 82 year old female with right hip pain.  She sustained a mechanical fall yesterday.  No loss of consciousness.  She does get around with use of a walker in assisted living.  She is here with her son who has power of attorney.  She denies any other orthopedic complaints.  She cannot take DVT prophylaxis because of risk of falls.  She does have diastolic dysfunction in his believed to be slightly volume overloaded at this time.  She's been seen by cardiology and has been deemed high risk for surgical intervention.  Past Medical History:  Diagnosis Date  . Bilateral leg weakness    chronic  . CAD (coronary artery disease)    a. 2-08/2012: Cath/PCI: LM 10-20d, LAD 60-70p, 60m(Rotablator->2.5x32 Promus Premier DES), D1 70-80ost, D2- small-90 ost/95p, LCX large, OM1 40p, OM2 small 80-90, RCA dominant 362mEf 65-70%.  . Diabetes mellitus   . Diastolic dysfunction   . Gout   . Hyperlipidemia   . Hypertension   . Hypothyroidism   . Mild aortic stenosis    a. 01/2010 Echo: Nl EF, mild AS/AI, mild LAE, mild PAH.  . Marland Kitchenermanent atrial fibrillation (HCC)    chads2vasc score is at least 6.  not a candidate for anticoagulation due to falls  . Shortness of breath    with chf & lying flat    Past Surgical History:  Procedure Laterality Date  . Breast biopsies    . CATARACT EXTRACTION    . CORONARY ANGIOPLASTY WITH STENT PLACEMENT  march 2014  . LEFT HEART CATHETERIZATION WITH CORONARY ANGIOGRAM N/A 08/02/2012   Procedure: LEFT HEART CATHETERIZATION WITH CORONARY ANGIOGRAM;  Surgeon: Peter M JoMartiniqueMD;  [facility];  Service: Cardiovascular;  Laterality: N/A;  . Left knee replacement    . PERCUTANEOUS CORONARY ROTOBLATOR INTERVENTION (PCI-R) N/A 08/05/2012   Procedure: PERCUTANEOUS CORONARY ROTOBLATOR INTERVENTION (PCI-R);  Surgeon: Peter M JoMartiniqueMD;  [facility];   Service: Cardiovascular;  Laterality: N/A;    Family History  Problem Relation Age of Onset  . Alzheimer's disease Brother   . Heart disease Father 8037  Social History:  reports that she has never smoked. She has never used smokeless tobacco. She reports that she does not drink alcohol or use drugs.  Allergies:  Allergies  Allergen Reactions  . Aspirin     Pt states she cannot have aspirin due to Eliquis  . Amlodipine Swelling    Medications: I have reviewed the patient's current medications.  Results for orders placed or performed during the hospital encounter of 01/25/2016 (from the past 48 hour(s))  Urinalysis, [facility] w reflex microscopic [facility]    Status: Abnormal   Collection Time: 01/24/2016 12:05 AM  Result Value Ref Range   Color, Urine YELLOW YELLOW   APPearance CLEAR CLEAR   Specific Gravity, Urine 1.017 1.005 - 1.030   pH 5.5 5.0 - 8.0   Glucose, UA NEGATIVE NEGATIVE mg/dL   Hgb urine dipstick NEGATIVE NEGATIVE   Bilirubin Urine NEGATIVE NEGATIVE   Ketones, ur NEGATIVE NEGATIVE mg/dL   Protein, ur NEGATIVE NEGATIVE mg/dL   Nitrite NEGATIVE NEGATIVE   Leukocytes, UA SMALL (A) NEGATIVE  Urine microscopic-add on     Status: Abnormal   Collection Time: 01/07/2016 12:05 AM  Result Value Ref Range   Squamous Epithelial / LPF 0-5 (  A) NONE SEEN   WBC, UA 6-30 0 - 5 WBC/hpf   RBC / HPF 0-5 0 - 5 RBC/hpf   Bacteria, UA RARE (A) NONE SEEN  Brain natriuretic peptide     Status: Abnormal   Collection Time: 01/19/2016  9:29 PM  Result Value Ref Range   B Natriuretic Peptide 259.9 (H) 0.0 - 100.0 pg/mL  Basic metabolic panel     Status: Abnormal   Collection Time: 01/06/2016  9:29 PM  Result Value Ref Range   Sodium 137 135 - 145 mmol/L   Potassium 3.9 3.5 - 5.1 mmol/L   Chloride 106 101 - 111 mmol/L   CO2 23 22 - 32 mmol/L   Glucose, Bld 141 (H) 65 - 99 mg/dL   BUN 22 (H) 6 - 20 mg/dL   Creatinine, Ser 0.96 0.44 - 1.00 mg/dL   Calcium 8.7 (L) 8.9 - 10.3 mg/dL    GFR calc non Af Amer 48 (L) >60 mL/min   GFR calc Af Amer 56 (L) >60 mL/min    Comment: (NOTE) The eGFR has been calculated using the CKD EPI equation. This calculation has not been validated in all clinical situations. eGFR's persistently <60 mL/min signify possible Chronic Kidney Disease.    Anion gap 8 5 - 15  CBC with Differential     Status: Abnormal   Collection Time: 02/02/2016  9:29 PM  Result Value Ref Range   WBC 13.0 (H) 4.0 - 10.5 K/uL   RBC 4.71 3.87 - 5.11 MIL/uL   Hemoglobin 14.4 12.0 - 15.0 g/dL   HCT 43.4 36.0 - 46.0 %   MCV 92.1 78.0 - 100.0 fL   MCH 30.6 26.0 - 34.0 pg   MCHC 33.2 30.0 - 36.0 g/dL   RDW 13.9 11.5 - 15.5 %   Platelets 137 (L) 150 - 400 K/uL   Neutrophils Relative % 90 %   Neutro Abs 11.6 (H) 1.7 - 7.7 K/uL   Lymphocytes Relative 5 %   Lymphs Abs 0.7 0.7 - 4.0 K/uL   Monocytes Relative 4 %   Monocytes Absolute 0.5 0.1 - 1.0 K/uL   Eosinophils Relative 1 %   Eosinophils Absolute 0.1 0.0 - 0.7 K/uL   Basophils Relative 0 %   Basophils Absolute 0.0 0.0 - 0.1 K/uL  Troponin I     Status: Abnormal   Collection Time: 01/24/2016 11:01 PM  Result Value Ref Range   Troponin I 0.03 (HH) <0.03 ng/mL    Comment: CRITICAL RESULT CALLED TO, READ BACK BY AND VERIFIED WITH: A.MARQUEZ RN [id number] A.QUIZON   MRSA PCR Screening     Status: None   Collection Time: 01/23/16  5:40 AM  Result Value Ref Range   MRSA by PCR NEGATIVE NEGATIVE    Comment:        The GeneXpert MRSA Assay (FDA approved for NASAL specimens only), is one component of a comprehensive MRSA colonization surveillance program. It is not intended to diagnose MRSA infection nor to guide or monitor treatment for MRSA infections.   Magnesium     Status: None   Collection Time: 01/23/16  5:53 AM  Result Value Ref Range   Magnesium 2.1 1.7 - 2.4 mg/dL  Phosphorus     Status: None   Collection Time: 01/23/16  5:53 AM  Result Value Ref Range   Phosphorus 4.2 2.5 - 4.6 mg/dL  TSH      Status: None   Collection Time: 01/23/16  5:53 AM  Result  Value Ref Range   TSH 1.340 0.350 - 4.500 uIU/mL  Comprehensive metabolic panel     Status: Abnormal   Collection Time: 01/23/16  5:53 AM  Result Value Ref Range   Sodium 140 135 - 145 mmol/L   Potassium 3.9 3.5 - 5.1 mmol/L   Chloride 102 101 - 111 mmol/L   CO2 25 22 - 32 mmol/L   Glucose, Bld 162 (H) 65 - 99 mg/dL   BUN 20 6 - 20 mg/dL   Creatinine, Ser 1.13 (H) 0.44 - 1.00 mg/dL   Calcium 9.2 8.9 - 10.3 mg/dL   Total Protein 6.1 (L) 6.5 - 8.1 g/dL   Albumin 3.4 (L) 3.5 - 5.0 g/dL   AST 25 15 - 41 U/L   ALT 18 14 - 54 U/L   Alkaline Phosphatase 104 38 - 126 U/L   Total Bilirubin 1.2 0.3 - 1.2 mg/dL   GFR calc non Af Amer 39 (L) >60 mL/min   GFR calc Af Amer 46 (L) >60 mL/min    Comment: (NOTE) The eGFR has been calculated using the CKD EPI equation. This calculation has not been validated in all clinical situations. eGFR's persistently <60 mL/min signify possible Chronic Kidney Disease.    Anion gap 13 5 - 15  CBC     Status: Abnormal   Collection Time: 01/23/16  5:53 AM  Result Value Ref Range   WBC 11.4 (H) 4.0 - 10.5 K/uL   RBC 4.70 3.87 - 5.11 MIL/uL   Hemoglobin 14.3 12.0 - 15.0 g/dL   HCT 44.5 36.0 - 46.0 %   MCV 94.7 78.0 - 100.0 fL   MCH 30.4 26.0 - 34.0 pg   MCHC 32.1 30.0 - 36.0 g/dL   RDW 13.8 11.5 - 15.5 %   Platelets 146 (L) 150 - 400 K/uL  Type and screen     Status: None   Collection Time: 01/23/16  5:53 AM  Result Value Ref Range   ABO/RH(D) O POS    Antibody Screen NEG    Sample Expiration 01/26/2016   Troponin I     Status: Abnormal   Collection Time: 01/23/16  5:53 AM  Result Value Ref Range   Troponin I 0.07 (HH) <0.03 ng/mL    Comment: CRITICAL RESULT CALLED TO, READ BACK BY AND VERIFIED WITH: GREGORYTRN [id number] MCCAULEG   ABO/Rh     Status: None (Preliminary result)   Collection Time: 01/23/16  5:53 AM  Result Value Ref Range   ABO/RH(D) O POS     Dg Chest 2  View  Result Date: 01/15/2016 CLINICAL DATA:  Fall.  Right hip pain.  No reported back pain. EXAM: CHEST  2 VIEW COMPARISON:  04/17/2015 FINDINGS: Bilateral pleural effusions also seen on prior exam. Mild pulmonary venous congestion. No convincing consolidation. No pneumothorax. Chronic cardiomegaly. Mediastinal contours are distorted by rotation. No visible fracture. Exaggerated thoracic kyphosis. Cannot exclude lower thoracic compression deformities, limited by overlapping shadows, but no reported back pain. IMPRESSION: Chronic bilateral pleural effusion, borderline moderate on the right. Pulmonary venous congestion. Electronically Signed   By: Monte Fantasia M.D.   On: 01/16/2016 21:10   Ct Head Wo Contrast  Result Date: 01/06/2016 CLINICAL DATA:  Unwitnessed fall headache and neck pain. EXAM: CT HEAD WITHOUT CONTRAST CT CERVICAL SPINE WITHOUT CONTRAST TECHNIQUE: Multidetector CT imaging of the head and cervical spine was performed following the standard protocol without intravenous contrast. Multiplanar CT image reconstructions of the cervical spine were also generated. COMPARISON:  06/15/2014 FINDINGS: CT HEAD FINDINGS Generalized age related atrophy. Mild chronic small-vessel ischemic change of the cerebral hemispheric white matter. No sign of acute infarction, mass lesion, hemorrhage, hydrocephalus or extra-axial collection. No skull fracture. No fluid in the sinuses. There is atherosclerotic calcification of the major vessels at the base of the brain. CT CERVICAL SPINE FINDINGS No malalignment. No visible soft tissue swelling. No fracture. Chronic facet fusion on the right at C4-5. Mild degenerative changes above and below that. Nitrogen gas in the disc spaces of C5-6 and C6-7 is degenerative and was present on the previous study. Chronic thyroid goiter again demonstrated. Some pleural fluid bilaterally. IMPRESSION: Head CT: Atrophy and chronic small vessel ischemic change. No acute or traumatic  finding. CT cervical spine: No acute or traumatic finding. Mild degenerative changes. Electronically Signed   By: Nelson Chimes M.D.   On: 01/17/2016 22:36   Ct Cervical Spine Wo Contrast  Result Date: 01/06/2016 CLINICAL DATA:  Unwitnessed fall headache and neck pain. EXAM: CT HEAD WITHOUT CONTRAST CT CERVICAL SPINE WITHOUT CONTRAST TECHNIQUE: Multidetector CT imaging of the head and cervical spine was performed following the standard protocol without intravenous contrast. Multiplanar CT image reconstructions of the cervical spine were also generated. COMPARISON:  06/15/2014 FINDINGS: CT HEAD FINDINGS Generalized age related atrophy. Mild chronic small-vessel ischemic change of the cerebral hemispheric white matter. No sign of acute infarction, mass lesion, hemorrhage, hydrocephalus or extra-axial collection. No skull fracture. No fluid in the sinuses. There is atherosclerotic calcification of the major vessels at the base of the brain. CT CERVICAL SPINE FINDINGS No malalignment. No visible soft tissue swelling. No fracture. Chronic facet fusion on the right at C4-5. Mild degenerative changes above and below that. Nitrogen gas in the disc spaces of C5-6 and C6-7 is degenerative and was present on the previous study. Chronic thyroid goiter again demonstrated. Some pleural fluid bilaterally. IMPRESSION: Head CT: Atrophy and chronic small vessel ischemic change. No acute or traumatic finding. CT cervical spine: No acute or traumatic finding. Mild degenerative changes. Electronically Signed   By: Nelson Chimes M.D.   On: 01/12/2016 22:36   Dg Hip Unilat W Or Wo Pelvis 2-3 Views Right  Result Date: 01/07/2016 CLINICAL DATA:  82 year old female status post unwitnessed fall, found down. Right hip pain. Initial encounter. EXAM: DG HIP (WITH OR WITHOUT PELVIS) 2-3V RIGHT COMPARISON:  None. FINDINGS: Right femoral neck fracture with varus impaction. The intertrochanteric region appears spared. The right femoral head  remains located within the acetabulum. Pelvis appears intact. SI joints within normal limits. Grossly intact proximal left femur. Dystrophic appearing calcifications adjacent to the left greater trochanter. Iliac artery atherosclerosis that iliac artery calcified atherosclerosis. IMPRESSION: Acute right femoral neck fracture with varus impaction. Electronically Signed   By: Genevie Ann M.D.   On: 01/11/2016 21:08    Review of Systems  Constitutional: Negative.   HENT: Negative.   Eyes: Negative.   Respiratory: Positive for shortness of breath.   Cardiovascular: Negative.   Gastrointestinal: Negative.   Genitourinary: Negative.   Musculoskeletal: Positive for joint pain.  Skin: Negative.   Neurological: Negative.   Endo/Heme/Allergies: Negative.   Psychiatric/Behavioral: Negative.    Blood pressure 119/66, pulse 62, temperature 98 F (36.7 C), temperature source Oral, resp. rate 14, height '5\' 4"'$  (1.626 m), weight 79.2 kg (174 lb 9.7 oz), SpO2 97 %. Physical Exam  Constitutional: She appears well-developed.  HENT:  Head: Normocephalic.  Eyes: Pupils are equal, round, and reactive to light.  Neck:  Normal range of motion.  Cardiovascular: Normal rate.   Respiratory: Effort normal.  Skin: Skin is warm.  Psychiatric: She has a normal mood and affect.   patient does have some mild edema in her extremities.  Right leg slightly shorter than the left.  She does have pain with movement of the right leg.  Both feet are perfused.  Assessment/Plan: Impression is right hip fracture in a 82 year old patient has multiple other medical comorbidities.  Primary among those is history of coronary artery disease as well as rate controlled atrial fibrillation.  She has some history of diastolic dysfunction and has mild volume overload at this time.  Along talk with her and her son today about the risks and benefits of surgical intervention.  In general without surgical intervention it'll be painful for her to  get up and around.  I really think she would be able to get up and around due to the pain.  She would essentially be more or less bedridden time.  Anticipate medical problems such as pneumonia blood clots possible skin breakdown to ensue within weeks to possibly 1-2 months.  Surgical intervention would be high risk and has the inherent risk of heart attack stroke infection intraoperative or perioperative death.  I did tell Jacqueline [name] that it would be high risk surgery and that it's possible that she may not make it out of the hospital.  I think there is about a 15-20% chance of that just based on her overall medical condition.  The patient herself is not particular keen at this time on surgical intervention but she's going to consider her options today.  If we're going to do a hemiarthroplasty I would favor doing it at the latest tomorrow.  I think this is something the family will have to decide area did Jacqueline [name] seems to have a good understanding of the issues involved.  I did go over the x-rays with him.  Showed him what a hip hemiarthroplasty looks like in another patient.  The risks and benefits of surgical intervention are discussed.  Jacqueline [name] 01/23/2016, 10:55 AM

## 2016-01-23 NOTE — ED Notes (Signed)
[facility] here to transfer pt to [facility]. 

## 2016-01-23 NOTE — Progress Notes (Signed)
Pt. verbalized understanding of the surgery to be done., signed surgical and blood transfusion consent.

## 2016-01-23 NOTE — Progress Notes (Signed)
Troponin-0.07 , Md aware with no order.continue to monitor.

## 2016-01-23 NOTE — ED Notes (Signed)
[facility]

## 2016-01-23 NOTE — Progress Notes (Signed)
[facility] Physician-Brief Progress Note Patient Name: Jacqueline GableMary E [name] DOB: 02/10/1917 MRN: [id number]   Date of Service  01/23/2016  HPI/Events of Note  Patient with worsening hypoxia and previously nonproductive cough now producing a pink frothy mucus per bedside nurse. Patient noted to be in atrial fibrillation which is chronic on telemetry. While the variable pulse rate. Hypertensive to 186/99. Patient having some mouth breathing with mildly increased work of breathing on nasal cannula oxygen. Bedside nurse attempted to contact cardiology but currently they are in the middle of a code STEMI. Suspect patient is having some pulmonary edema from decompensation/left ventricular dysfunction.   eICU Interventions  1. Await result of portable chest x-ray 2. Lasix already administered 3. Hydralazine 5 mg IV every 4 hours when necessary elevated systolic blood pressure 4. Switching to Venturi mask to promote oxygenation with mouth breathing 5. Recommended contacting primary service to make hospitalist aware for bedside evaluation      Intervention Category Major Interventions: Arrhythmia - evaluation and management;Respiratory failure - evaluation and management  Lawanda CousinsJennings Sopheap Boehle 01/23/2016, 4:54 PM

## 2016-01-23 NOTE — Progress Notes (Signed)
Received a call   from the surgeon that pt is for surgery  Right hemiarthroplasty today.

## 2016-01-23 NOTE — Evaluation (Signed)
Clinical/Bedside Swallow Evaluation Patient Details  Name: Jacqueline [name] MRN: [id number] Date of Birth: 11/07/1916  Today's Date: 01/23/2016 Time: SLP Start Time (ACUTE ONLY): 41320723 SLP Stop Time (ACUTE ONLY): 0740 SLP Time Calculation (min) (ACUTE ONLY): 17 min  Past Medical History:  Past Medical History:  Diagnosis Date  . Atrial fibrillation (HCC)   . Bilateral leg weakness    chronic  . CAD (coronary artery disease)    a. 2-08/2012: Cath/PCI: LM 10-20d, LAD 60-70p, 11064m (Rotablator->2.5x32 Promus Premier DES), D1 70-80ost, D2- small-90 ost/95p, LCX large, OM1 40p, OM2 small 80-90, RCA dominant 7712m, Ef 65-70%.  . CHF (congestive heart failure) (HCC)   . Diabetes mellitus   . Gout   . Hyperlipidemia   . Hypertension   . Hypothyroidism   . Mild aortic stenosis    a. 01/2010 Echo: Nl EF, mild AS/AI, mild LAE, mild PAH.  Marland Kitchen. Shortness of breath    with chf & lying flat   Past Surgical History:  Past Surgical History:  Procedure Laterality Date  . Breast biopsies    . CATARACT EXTRACTION    . CORONARY ANGIOPLASTY WITH STENT PLACEMENT  march 2014  . LEFT HEART CATHETERIZATION WITH CORONARY ANGIOGRAM N/A 08/02/2012   Procedure: LEFT HEART CATHETERIZATION WITH CORONARY ANGIOGRAM;  Surgeon: Peter M SwazilandJordan, MD;  [facility];  Service: Cardiovascular;  Laterality: N/A;  . Left knee replacement    . PERCUTANEOUS CORONARY ROTOBLATOR INTERVENTION (PCI-R) N/A 08/05/2012   Procedure: PERCUTANEOUS CORONARY ROTOBLATOR INTERVENTION (PCI-R);  Surgeon: Peter M SwazilandJordan, MD;  [facility];  Service: Cardiovascular;  Laterality: N/A;   HPI:  Pt is a 82 y.o. female with medical history significant of DM, diastolic heart failure, gout, HTN, A. fib not on anticoagulation, hypothyroidism, allergic stenosis mild per echogram in 2011, pulmonary hypertension. Pt with chronic pleural effusion, non-ST elevated MI in 2014 here with right femoral neck fracture after mechanical fall also evidence of  hypoxia with slightly worsening pleural effusions.     Assessment / Plan / Recommendation Clinical Impression  Pts swallow appearing within functional limits. Pt unable to tolerate fully upright positioning during BSE, secondary to pain in hip though no signs or symptoms of aspiration were noted with any PO this date.  Recommend regular thin diet initiation with medicines whole in puree. Pt reports episodic difficulty with larger PO meds. No further ST needs identified.     Aspiration Risk  Mild aspiration risk    Diet Recommendation   Regular diet, thin liquids   Medication Administration: Whole meds with puree    Other  Recommendations Oral Care Recommendations: Oral care BID   Follow up Recommendations       Frequency and Duration            Prognosis        Swallow Study   General Date of Onset: 01/21/2016 HPI: Pt is a 82 y.o. female with medical history significant of DM, diastolic heart failure, gout, HTN, A. fib not on anticoagulation, hypothyroidism, allergic stenosis mild per echogram in 2011, pulmonary hypertension. Pt with chronic pleural effusion, non-ST elevated MI in 2014 here with right femoral neck fracture after mechanical fall also evidence of hypoxia with slightly worsening pleural effusions.   Type of Study: Bedside Swallow Evaluation Diet Prior to this Study: NPO Temperature Spikes Noted: No Respiratory Status: Nasal cannula History of Recent Intubation: No Behavior/Cognition: Alert;Cooperative Oral Cavity Assessment: Dry Oral Care Completed by SLP: Yes Oral Cavity - Dentition:  Adequate natural dentition Vision: Functional for self-feeding Self-Feeding Abilities: Able to feed self Patient Positioning: Upright in bed;Partially reclined (secondary to pain in hip, RN notified) Baseline Vocal Quality: Low vocal intensity Volitional Cough: Strong Volitional Swallow: Able to elicit    Oral/Motor/Sensory Function Overall Oral Motor/Sensory Function: Within  functional limits   Ice Chips Ice chips: Within functional limits Presentation: Spoon   Thin Liquid Thin Liquid: Within functional limits Presentation: Cup;Straw    Nectar Thick Nectar Thick Liquid: Not tested   Honey Thick Honey Thick Liquid: Not tested   Puree Puree: Within functional limits   Solid   GO   Solid: Within functional limits       Jacqueline Duoshelsea Sumney MA, CCC-SLP Acute Care Speech Language Pathologist    Jacqueline [name], Jacqueline [name] 01/23/2016,7:46 AM

## 2016-01-24 ENCOUNTER — Encounter (HOSPITAL_COMMUNITY): Admission: EM | Disposition: E | Payer: Self-pay | Source: Home / Self Care | Attending: Neurology

## 2016-01-24 ENCOUNTER — Inpatient Hospital Stay (HOSPITAL_COMMUNITY): Payer: Medicare Other

## 2016-01-24 ENCOUNTER — Inpatient Hospital Stay (HOSPITAL_COMMUNITY): Payer: Medicare Other | Admitting: Anesthesiology

## 2016-01-24 ENCOUNTER — Encounter (HOSPITAL_COMMUNITY): Payer: Self-pay | Admitting: *Deleted

## 2016-01-24 DIAGNOSIS — I509 Heart failure, unspecified: Secondary | ICD-10-CM

## 2016-01-24 DIAGNOSIS — I272 Other secondary pulmonary hypertension: Secondary | ICD-10-CM

## 2016-01-24 DIAGNOSIS — I4891 Unspecified atrial fibrillation: Secondary | ICD-10-CM

## 2016-01-24 HISTORY — PX: TOTAL HIP ARTHROPLASTY: SHX124

## 2016-01-24 LAB — COMPREHENSIVE METABOLIC PANEL
ALBUMIN: 3.1 g/dL — AB (ref 3.5–5.0)
ALT: 16 U/L (ref 14–54)
ANION GAP: 8 (ref 5–15)
AST: 21 U/L (ref 15–41)
Alkaline Phosphatase: 79 U/L (ref 38–126)
BILIRUBIN TOTAL: 1.2 mg/dL (ref 0.3–1.2)
BUN: 27 mg/dL — ABNORMAL HIGH (ref 6–20)
CO2: 25 mmol/L (ref 22–32)
Calcium: 8.9 mg/dL (ref 8.9–10.3)
Chloride: 105 mmol/L (ref 101–111)
Creatinine, Ser: 1.29 mg/dL — ABNORMAL HIGH (ref 0.44–1.00)
GFR calc Af Amer: 39 mL/min — ABNORMAL LOW (ref >60)
GFR, EST NON AFRICAN AMERICAN: 34 mL/min — AB (ref >60)
Glucose, Bld: 144 mg/dL — ABNORMAL HIGH (ref 65–99)
POTASSIUM: 3.5 mmol/L (ref 3.5–5.1)
Sodium: 138 mmol/L (ref 135–145)
TOTAL PROTEIN: 5.8 g/dL — AB (ref 6.5–8.1)

## 2016-01-24 LAB — CBC
HEMATOCRIT: 43.8 % (ref 36.0–46.0)
Hemoglobin: 14.1 g/dL (ref 12.0–15.0)
MCH: 30.5 pg (ref 26.0–34.0)
MCHC: 32.2 g/dL (ref 30.0–36.0)
MCV: 94.8 fL (ref 78.0–100.0)
Platelets: 115 10*3/uL — ABNORMAL LOW (ref 150–400)
RBC: 4.62 MIL/uL (ref 3.87–5.11)
RDW: 14.2 % (ref 11.5–15.5)
WBC: 15 10*3/uL — ABNORMAL HIGH (ref 4.0–10.5)

## 2016-01-24 LAB — ECHOCARDIOGRAM COMPLETE
Height: 64 in
Weight: 2730.18 oz

## 2016-01-24 LAB — GLUCOSE, CAPILLARY
Glucose-Capillary: 104 mg/dL — ABNORMAL HIGH (ref 65–99)
Glucose-Capillary: 112 mg/dL — ABNORMAL HIGH (ref 65–99)

## 2016-01-24 SURGERY — HEMIARTHROPLASTY, HIP, DIRECT ANTERIOR APPROACH, FOR FRACTURE
Anesthesia: General | Site: Hip | Laterality: Right

## 2016-01-24 SURGERY — ARTHROPLASTY, HIP, TOTAL, ANTERIOR APPROACH
Anesthesia: General | Laterality: Right

## 2016-01-24 MED ORDER — METHOCARBAMOL 500 MG PO TABS: 500.0000 mg | ORAL_TABLET | Freq: Four times a day (QID) | ORAL | Status: DC | PRN

## 2016-01-24 MED ORDER — ONDANSETRON HCL 4 MG/2ML IJ SOLN
INTRAMUSCULAR | Status: AC
  Filled 2016-01-24: qty 2

## 2016-01-24 MED ORDER — CEFAZOLIN SODIUM-DEXTROSE 2-4 GM/100ML-% IV SOLN
INTRAVENOUS | Status: AC
  Filled 2016-01-24: qty 100

## 2016-01-24 MED ORDER — MENTHOL 3 MG MT LOZG: 1.0000 | LOZENGE | OROMUCOSAL | Status: DC | PRN

## 2016-01-24 MED ORDER — MORPHINE SULFATE (PF) 2 MG/ML IV SOLN: 0.5000 mg | INTRAVENOUS | Status: DC | PRN

## 2016-01-24 MED ORDER — LACTATED RINGERS IV SOLN
INTRAVENOUS | Status: DC | PRN
  Administered 2016-01-24: 17:00:00 via INTRAVENOUS

## 2016-01-24 MED ORDER — ROCURONIUM BROMIDE 10 MG/ML (PF) SYRINGE
PREFILLED_SYRINGE | INTRAVENOUS | Status: AC
  Filled 2016-01-24: qty 10

## 2016-01-24 MED ORDER — ONDANSETRON HCL 4 MG/2ML IJ SOLN: 4.0000 mg | Freq: Four times a day (QID) | INTRAMUSCULAR | Status: DC | PRN

## 2016-01-24 MED ORDER — LIDOCAINE 2% (20 MG/ML) 5 ML SYRINGE
INTRAMUSCULAR | Status: DC | PRN
  Administered 2016-01-24: 60 mg via INTRAVENOUS

## 2016-01-24 MED ORDER — LIDOCAINE 2% (20 MG/ML) 5 ML SYRINGE
INTRAMUSCULAR | Status: AC
  Filled 2016-01-24: qty 5

## 2016-01-24 MED ORDER — PROPOFOL 10 MG/ML IV BOLUS
INTRAVENOUS | Status: DC | PRN
  Administered 2016-01-24: 20 mg via INTRAVENOUS
  Administered 2016-01-24: 50 mg via INTRAVENOUS

## 2016-01-24 MED ORDER — METHOCARBAMOL 1000 MG/10ML IJ SOLN: 500.0000 mg | Freq: Four times a day (QID) | INTRAVENOUS | Status: DC | PRN

## 2016-01-24 MED ORDER — METOCLOPRAMIDE HCL 5 MG/ML IJ SOLN: 5.0000 mg | Freq: Three times a day (TID) | INTRAMUSCULAR | Status: DC | PRN

## 2016-01-24 MED ORDER — ROCURONIUM BROMIDE 100 MG/10ML IV SOLN
INTRAVENOUS | Status: DC | PRN
  Administered 2016-01-24: 40 mg via INTRAVENOUS

## 2016-01-24 MED ORDER — PHENYLEPHRINE HCL 10 MG/ML IJ SOLN
INTRAMUSCULAR | Status: DC | PRN
  Administered 2016-01-24: 40 ug via INTRAVENOUS
  Administered 2016-01-24: 80 ug via INTRAVENOUS

## 2016-01-24 MED ORDER — ENOXAPARIN SODIUM 30 MG/0.3ML ~~LOC~~ SOLN
30.0000 mg | SUBCUTANEOUS | Status: DC
  Administered 2016-01-25 – 2016-01-29 (×5): 30 mg via SUBCUTANEOUS
  Filled 2016-01-24 (×5): qty 0.3

## 2016-01-24 MED ORDER — MIDAZOLAM HCL 2 MG/2ML IJ SOLN
INTRAMUSCULAR | Status: AC
  Filled 2016-01-24: qty 2

## 2016-01-24 MED ORDER — FENTANYL CITRATE (PF) 100 MCG/2ML IJ SOLN
INTRAMUSCULAR | Status: AC
  Filled 2016-01-24: qty 2

## 2016-01-24 MED ORDER — FENTANYL CITRATE (PF) 100 MCG/2ML IJ SOLN
INTRAMUSCULAR | Status: DC | PRN
  Administered 2016-01-24: 50 ug via INTRAVENOUS
  Administered 2016-01-24: 25 ug via INTRAVENOUS

## 2016-01-24 MED ORDER — HYDRALAZINE HCL 20 MG/ML IJ SOLN
10.0000 mg | Freq: Four times a day (QID) | INTRAMUSCULAR | Status: DC | PRN
Start: 2016-01-24 — End: 2016-01-30
  Administered 2016-01-27: 5 mg via INTRAVENOUS
  Administered 2016-01-27: 10 mg via INTRAVENOUS
  Filled 2016-01-24 (×2): qty 1

## 2016-01-24 MED ORDER — ACETAMINOPHEN 650 MG RE SUPP: 650.0000 mg | Freq: Four times a day (QID) | RECTAL | Status: DC | PRN

## 2016-01-24 MED ORDER — METOPROLOL TARTRATE 5 MG/5ML IV SOLN
5.0000 mg | INTRAVENOUS | Status: DC | PRN
  Administered 2016-01-27: 5 mg via INTRAVENOUS
  Filled 2016-01-24 (×2): qty 5

## 2016-01-24 MED ORDER — ACETAMINOPHEN 325 MG PO TABS: 650.0000 mg | ORAL_TABLET | Freq: Four times a day (QID) | ORAL | Status: DC | PRN

## 2016-01-24 MED ORDER — ALBUMIN HUMAN 5 % IV SOLN
INTRAVENOUS | Status: DC | PRN
  Administered 2016-01-24: 18:00:00 via INTRAVENOUS

## 2016-01-24 MED ORDER — FUROSEMIDE 10 MG/ML IJ SOLN
40.0000 mg | Freq: Once | INTRAMUSCULAR | Status: AC
  Administered 2016-01-24: 40 mg via INTRAVENOUS
  Filled 2016-01-24: qty 4

## 2016-01-24 MED ORDER — PHENOL 1.4 % MT LIQD: 1.0000 | OROMUCOSAL | Status: DC | PRN

## 2016-01-24 MED ORDER — POTASSIUM CHLORIDE CRYS ER 20 MEQ PO TBCR
40.0000 meq | EXTENDED_RELEASE_TABLET | Freq: Once | ORAL | Status: AC
  Administered 2016-01-24: 40 meq via ORAL
  Filled 2016-01-24: qty 2

## 2016-01-24 MED ORDER — METOCLOPRAMIDE HCL 5 MG PO TABS
5.0000 mg | ORAL_TABLET | Freq: Three times a day (TID) | ORAL | Status: DC | PRN
  Filled 2016-01-24: qty 2

## 2016-01-24 MED ORDER — SUGAMMADEX SODIUM 200 MG/2ML IV SOLN
INTRAVENOUS | Status: DC | PRN
  Administered 2016-01-24: 155 mg via INTRAVENOUS

## 2016-01-24 MED ORDER — SUGAMMADEX SODIUM 200 MG/2ML IV SOLN
INTRAVENOUS | Status: AC
  Filled 2016-01-24: qty 2

## 2016-01-24 MED ORDER — ONDANSETRON HCL 4 MG PO TABS: 4.0000 mg | ORAL_TABLET | Freq: Four times a day (QID) | ORAL | Status: DC | PRN

## 2016-01-24 MED ORDER — FUROSEMIDE 10 MG/ML IJ SOLN: 40.0000 mg | Freq: Once | INTRAMUSCULAR | Status: DC

## 2016-01-24 MED ORDER — CEFAZOLIN SODIUM-DEXTROSE 2-4 GM/100ML-% IV SOLN
2.0000 g | Freq: Four times a day (QID) | INTRAVENOUS | Status: AC
  Administered 2016-01-24 – 2016-01-25 (×2): 2 g via INTRAVENOUS
  Filled 2016-01-24 (×2): qty 100

## 2016-01-24 MED ORDER — PHENYLEPHRINE HCL 10 MG/ML IJ SOLN
INTRAMUSCULAR | Status: DC | PRN
  Administered 2016-01-24: 15 ug/min via INTRAVENOUS

## 2016-01-24 MED ORDER — CEFAZOLIN SODIUM-DEXTROSE 2-3 GM-% IV SOLR
INTRAVENOUS | Status: DC | PRN
  Administered 2016-01-24: 2 g via INTRAVENOUS

## 2016-01-24 MED ORDER — HYDROCODONE-ACETAMINOPHEN 5-325 MG PO TABS
1.0000 | ORAL_TABLET | Freq: Four times a day (QID) | ORAL | Status: DC | PRN
  Administered 2016-01-24 – 2016-01-25 (×2): 1 via ORAL
  Filled 2016-01-24 (×2): qty 1

## 2016-01-24 SURGICAL SUPPLY — 55 items
BENZOIN TINCTURE PRP APPL 2/3 (GAUZE/BANDAGES/DRESSINGS) ×3 IMPLANT
BLADE SAW SGTL 18X1.27X75 (BLADE) ×2 IMPLANT
BLADE SAW SGTL 18X1.27X75MM (BLADE) ×1
BLADE SURG ROTATE 9660 (MISCELLANEOUS) IMPLANT
CAPT HIP HEMI 2 ×3 IMPLANT
CELLS DAT CNTRL 66122 CELL SVR (MISCELLANEOUS) ×1 IMPLANT
CLOSURE WOUND 1/2 X4 (GAUZE/BANDAGES/DRESSINGS) ×1
COVER SURGICAL LIGHT HANDLE (MISCELLANEOUS) ×3 IMPLANT
DRAPE C-ARM 42X72 X-RAY (DRAPES) ×3 IMPLANT
DRAPE STERI IOBAN 125X83 (DRAPES) ×3 IMPLANT
DRAPE U-SHAPE 47X51 STRL (DRAPES) ×9 IMPLANT
DRSG AQUACEL AG ADV 3.5X10 (GAUZE/BANDAGES/DRESSINGS) ×3 IMPLANT
DURAPREP 26ML APPLICATOR (WOUND CARE) ×3 IMPLANT
ELECT BLADE 4.0 EZ CLEAN MEGAD (MISCELLANEOUS) ×3
ELECT BLADE 6.5 EXT (BLADE) IMPLANT
ELECT REM PT RETURN 9FT ADLT (ELECTROSURGICAL) ×3
ELECTRODE BLDE 4.0 EZ CLN MEGD (MISCELLANEOUS) ×1 IMPLANT
ELECTRODE REM PT RTRN 9FT ADLT (ELECTROSURGICAL) ×1 IMPLANT
FACESHIELD WRAPAROUND (MASK) ×6 IMPLANT
GAUZE XEROFORM 1X8 LF (GAUZE/BANDAGES/DRESSINGS) ×3 IMPLANT
GLOVE BIOGEL PI IND STRL 6.5 (GLOVE) ×2 IMPLANT
GLOVE BIOGEL PI IND STRL 8 (GLOVE) ×3 IMPLANT
GLOVE BIOGEL PI INDICATOR 6.5 (GLOVE) ×4
GLOVE BIOGEL PI INDICATOR 8 (GLOVE) ×6
GLOVE ECLIPSE 8.0 STRL XLNG CF (GLOVE) ×3 IMPLANT
GLOVE ORTHO TXT STRL SZ7.5 (GLOVE) ×6 IMPLANT
GLOVE SURG SS PI 6.5 STRL IVOR (GLOVE) ×6 IMPLANT
GLOVE SURG SS PI 8.0 STRL IVOR (GLOVE) ×3 IMPLANT
GOWN STRL REUS W/ TWL LRG LVL3 (GOWN DISPOSABLE) ×2 IMPLANT
GOWN STRL REUS W/ TWL XL LVL3 (GOWN DISPOSABLE) ×2 IMPLANT
GOWN STRL REUS W/TWL LRG LVL3 (GOWN DISPOSABLE) ×4
GOWN STRL REUS W/TWL XL LVL3 (GOWN DISPOSABLE) ×4
HANDPIECE INTERPULSE COAX TIP (DISPOSABLE)
KIT BASIN OR (CUSTOM PROCEDURE TRAY) ×3 IMPLANT
KIT ROOM TURNOVER OR (KITS) ×3 IMPLANT
MANIFOLD NEPTUNE II (INSTRUMENTS) ×3 IMPLANT
NS IRRIG 1000ML POUR BTL (IV SOLUTION) ×3 IMPLANT
PACK TOTAL JOINT (CUSTOM PROCEDURE TRAY) ×3 IMPLANT
PAD ARMBOARD 7.5X6 YLW CONV (MISCELLANEOUS) ×3 IMPLANT
RTRCTR WOUND ALEXIS 18CM MED (MISCELLANEOUS) ×3
SET HNDPC FAN SPRY TIP SCT (DISPOSABLE) IMPLANT
STAPLER VISISTAT 35W (STAPLE) ×3 IMPLANT
STRIP CLOSURE SKIN 1/2X4 (GAUZE/BANDAGES/DRESSINGS) ×2 IMPLANT
SUT ETHIBOND NAB CT1 #1 30IN (SUTURE) ×3 IMPLANT
SUT MNCRL AB 4-0 PS2 18 (SUTURE) IMPLANT
SUT VIC AB 0 CT1 27 (SUTURE) ×2
SUT VIC AB 0 CT1 27XBRD ANBCTR (SUTURE) ×1 IMPLANT
SUT VIC AB 1 CT1 27 (SUTURE) ×2
SUT VIC AB 1 CT1 27XBRD ANBCTR (SUTURE) ×1 IMPLANT
SUT VIC AB 2-0 CT1 27 (SUTURE) ×4
SUT VIC AB 2-0 CT1 TAPERPNT 27 (SUTURE) ×2 IMPLANT
TOWEL OR 17X24 6PK STRL BLUE (TOWEL DISPOSABLE) ×3 IMPLANT
TOWEL OR 17X26 10 PK STRL BLUE (TOWEL DISPOSABLE) ×3 IMPLANT
TRAY FOLEY CATH 16FRSI W/METER (SET/KITS/TRAYS/PACK) IMPLANT
WATER STERILE IRR 1000ML POUR (IV SOLUTION) IMPLANT

## 2016-01-24 NOTE — Progress Notes (Signed)
Attempted to remove nail polish with polish remover on her fingernails but unsuccessful.

## 2016-01-24 NOTE — Brief Op Note (Signed)
01/29/2016 - 01/11/2016  6:49 PM  PATIENT:  Jacqueline GableMary E [name]  82 y.o. female  PRE-OPERATIVE DIAGNOSIS:  RIGHT FEMORAL NECK FRACTURE  POST-OPERATIVE DIAGNOSIS:  RIGHT FEMORAL NECK FRACTURE  PROCEDURE:  Procedure(s): HEMI- ARTHROPLASTY ANTERIOR APPROACH (Right)  SURGEON:  Surgeon(s) and Role:    * Kathryne Hitchhristopher Y Angeleigh Chiasson, MD - Primary  ANESTHESIA:   general  EBL:  Total I/O In: -  Out: 640 [Urine:565; Blood:75]  COUNTS:  YES  DICTATION: .Other Dictation: Dictation Number [id number]  PLAN OF CARE: Admit to inpatient   PATIENT DISPOSITION:  PACU - guarded condition.   Delay start of Pharmacological VTE agent (>24hrs) due to surgical blood loss or risk of bleeding: no

## 2016-01-24 NOTE — Progress Notes (Signed)
Patient Name: Jacqueline GableMary E [name] Date of Encounter: 01/26/2016  Active Problems:   Essential hypertension   CAD (coronary artery disease)   Chronic atrial fibrillation (HCC)   Pleural effusion   Hypoxia   Acute on chronic diastolic (congestive) heart failure (HCC)   Moderate pulmonary hypertension (HCC)   Closed displaced fracture of right femoral neck (HCC)   Closed right hip fracture (HCC)   Atrial fibrillation with RVR (HCC)   Length of Stay: 2  SUBJECTIVE  Denies chest pain and states that her breathing has improved, she has pain in her right hip.  CURRENT MEDS . carvedilol  12.5 mg Oral BID WC  . furosemide  40 mg Intravenous Q12H  . irbesartan  150 mg Oral Daily  . levothyroxine  50 mcg Intravenous Daily  . potassium chloride  40 mEq Oral Once  . senna  1 tablet Oral BID  . sodium chloride flush  3 mL Intravenous Q12H  . sodium chloride flush  3 mL Intravenous Q12H    OBJECTIVE  Vitals:   01/23/16 2345 01/17/2016 0000 02/03/2016 0347 02/01/2016 0815  BP: (!) 105/53 119/74 (!) 117/57   Pulse: 77 76 73   Resp: (!) 21 (!) 21 (!) 24   Temp: 97.4 F (36.3 C)  97.4 F (36.3 C) 97.7 F (36.5 C)  TempSrc: Oral  Oral Oral  SpO2: 97% 96% 97%   Weight:   170 lb 10.2 oz (77.4 kg)   Height:        Intake/Output Summary (Last 24 hours) at 01/26/2016 0853 Last data filed at 01/19/2016 0500  Gross per 24 hour  Intake              160 ml  Output             1425 ml  Net            -1265 ml   Filed Weights   01/23/16 0532 01/23/16 0640 02/02/2016 0347  Weight: 175 lb 14.8 oz (79.8 kg) 174 lb 9.7 oz (79.2 kg) 170 lb 10.2 oz (77.4 kg)    PHYSICAL EXAM  General: Pleasant, in mild distress sec to pain in her hip. Neuro: Alert and oriented X 3. Moves all extremities spontaneously. Psych: Normal affect. HEENT:  Normal  Neck: Supple without bruits or JVD. Lungs:  Resp regular and unlabored, minimal crackles at lung bases.Marland Kitchen. Heart: RRR no s3, s4, or murmurs. Abdomen: Soft,  non-tender, non-distended, BS + x 4.  Extremities: No clubbing, cyanosis or edema. Lateral distortion of her right LLE  Accessory Clinical Findings  CBC  Recent Labs  2016-06-03 2129 01/23/16 0553 01/31/2016 0303  WBC 13.0* 11.4* 15.0*  NEUTROABS 11.6*  --   --   HGB 14.4 14.3 14.1  HCT 43.4 44.5 43.8  MCV 92.1 94.7 94.8  PLT 137* 146* 115*   Basic Metabolic Panel  Recent Labs  01/23/16 0553 01/26/2016 0303  NA 140 138  K 3.9 3.5  CL 102 105  CO2 25 25  GLUCOSE 162* 144*  BUN 20 27*  CREATININE 1.13* 1.29*  CALCIUM 9.2 8.9  MG 2.1  --   PHOS 4.2  --    Liver Function Tests  Recent Labs  01/23/16 0553 01/08/2016 0303  AST 25 21  ALT 18 16  ALKPHOS 104 79  BILITOT 1.2 1.2  PROT 6.1* 5.8*  ALBUMIN 3.4* 3.1*    Recent Labs  2016-06-03 2301 01/23/16 0553  TROPONINI 0.03* 0.07*  Recent Labs  01/23/16 0553  TSH 1.340    Radiology/Studies  Dg Chest 2 View  Result Date: 01/31/2016 CLINICAL DATA:  Fall.  Right hip pain.  No reported back pain. EXAM: CHEST  2 VIEW COMPARISON:  04/17/2015 FINDINGS: Bilateral pleural effusions also seen on prior exam. Mild pulmonary venous congestion. No convincing consolidation. No pneumothorax. Chronic cardiomegaly. Mediastinal contours are distorted by rotation. No visible fracture. Exaggerated thoracic kyphosis. Cannot exclude lower thoracic compression deformities, limited by overlapping shadows, but no reported back pain. IMPRESSION: Chronic bilateral pleural effusion, borderline moderate on the right. Pulmonary venous congestion. Electronically Signed   By: Marnee SpringJonathon  Watts M.D.   On: 01/09/2016 21:10   Dg Chest Port 1 View  Result Date: 01/23/2016 CLINICAL DATA:  Pulmonary edema EXAM: PORTABLE CHEST 1 VIEW COMPARISON:  01/12/2016 FINDINGS: Cardiac shadow is stable but mildly enlarged. Aortic calcifications are again seen. Vascular congestion and right-sided pleural effusion are noted. Stable right basilar infiltrate is seen.  No bony abnormality is noted. IMPRESSION: Increasing vascular congestion when compare with the prior exam. Stable changes in the right base consistent with effusion and infiltrate. Electronically Signed   By: Alcide CleverMark  Lukens M.D.   On: 01/23/2016 17:14   Dg Hip Unilat W Or Wo Pelvis 2-3 Views Right  Result Date: 01/21/2016 CLINICAL DATA:  82 year old female status post unwitnessed fall, found down. Right hip pain. Initial encounter. EXAM: DG HIP (WITH OR WITHOUT PELVIS) 2-3V RIGHT COMPARISON:  None. FINDINGS: Right femoral neck fracture with varus impaction. The intertrochanteric region appears spared. The right femoral head remains located within the acetabulum. Pelvis appears intact. SI joints within normal limits. Grossly intact proximal left femur. Dystrophic appearing calcifications adjacent to the left greater trochanter. Iliac artery atherosclerosis that iliac artery calcified atherosclerosis. IMPRESSION: Acute right femoral neck fracture with varus impaction. Electronically Signed   By: Odessa FlemingH  Hall M.D.   On: 01/11/2016 21:08    TELE: a-fib rate controlled    ASSESSMENT AND PLAN  82 year old female with right hip fracture  1. Acute on chronic diastolic CHF - possibly sec to iv fluids, diuresed 1.3 L and down 5 lbs overnight with improvement of symptoms. I would give additional 40 mg of iv lasix before proceeding with surgery later today.   2. Preoperative evaluation Pt has h/o CAD but without active symptoms of ischemia.  She has diastolic dysfunction with preserved EF.  She has rate controlled afib.  She has mild AS. Given her advanced age and comorbidities, she will be high risk for surgery. Minimally elevated troponin represents demand ischemia. I do not feel that further CV evaluation would help to remediate these surgical risks.  I would not advise any further additional CV testing at this time. I would give additional 40 mg of iv lasix before proceeding with surgery later today. continue  and perioperative beta blocker therapy.   3. Permanent atrial fibrillation chads2vasc score is 6 Given frequent falls, she is not a candidate for anticoagulation long term Rate controlled off IV diltiazem Continue home carvediolol and use metoprolol prn for HR sustained > 100 bpm  4. CAD No ischemic symptoms No further workup as above Resume ASA when able Perioperative beta blocker therapy  5. HTN Controlled  6. Mild AS No further workup or management  We will follow.  Signed, Tobias AlexanderKatarina Aldair Rickel MD, [facility] CenterFACC 04/17/16

## 2016-01-24 NOTE — Op Note (Signed)
Jacqueline [name]:  Jacqueline [name], Jacqueline [name]                 ACCOUNT NO.:  [id number]  MEDICAL RECORD NO.:  [id number]  LOCATION:  2H20C                        FACILITY:  [facility]  PHYSICIAN:  Vanita PandaChristopher Y. Magnus [name], M.D.DATE OF BIRTH:  [birth year]  DATE OF PROCEDURE:  01/15/2016 DATE OF DISCHARGE:                              OPERATIVE REPORT   PREOPERATIVE DIAGNOSIS:  Right hip with displaced femoral neck fracture.  POSTOPERATIVE DIAGNOSIS:  Right hip with displaced femoral neck fracture.  PROCEDURE:  Right hip hemiarthroplasty.  IMPLANTS:  DePuy Corail femoral component size 12 with standard offset, size 49 bipolar hip ball with a 28+ 1 inner ball.  SURGEON:  Vanita PandaChristopher Y. Magnus [name], M.D.  ANESTHESIA:  General.  ANTIBIOTICS:  2 g of IV Ancef.  BLOOD LOSS:  75 mL.  COMPLICATIONS:  None.  INDICATIONS:  [name] is a 82 year old female, who sustained a mechanical fall this past weekend suffering a right hip femoral neck fracture, this is a significantly displaced fracture.  She is having severe pain.  She is someone who is of sound mind and a community ambulator.  Her pain is severe; however, she has significant medical problems including heart issues.  We had set her up for surgery for yesterday, but she was in frank pulmonary edema and that had to diurese her lot over the evening, she presents now for surgery, having significant improvement in her lung issues.  She has been cleared as much as possible from Cardiology standpoint with the family knows this is a high risk surgery, no matter which direction we take.  She is at high risk for mortality with no fixation and high risk with fixation, we are doing this surgery mainly for comfort purposes due to severe pain she was having in her right hip and for quality of life purposes.  I have talked to her son and daughter in length about this and they do understand this and wish us to proceed with surgery as does the patient being of sound  mind.  PROCEDURE DESCRIPTION:  After informed consent was obtained and a thorough discussion of risks and benefits was had, right hip was marked. She was brought to the operating room and general anesthesia was obtained while she was on her stretcher.  A Foley catheter was already in place.  Both feet had traction boots applied to them.  Next, she was placed supine on the Hana fracture table with the perineal post in place and both legs in inline skeletal traction devices, but no traction applied.  Her right operative hip was then prepped and draped with DuraPrep and sterile drapes.  A time-out was called, she was identified as correct patient and correct right hip.  I then made an incision inferior and posterior to the anterior superior iliac spine and carried this obliquely down the leg.  We dissected down the tensor fascia lata muscle.  The tensor fascia was then divided longitudinally to proceed with a direct anterior approach to the hip.  We identified and cauterized the circumflex vessels and identified the hip capsule, opened up the hip capsule finding a hematoma from her fracture.  We placed Cobra retractors within the hip capsule and then made  our femoral neck cut with an oscillating saw and completed this with an osteotome.  I placed a corkscrew guide in the femoral head and removed the femoral head in its entirety.  I then removed any remnants of bone debris from the hip socket and we went with a size 49 hip ball.  Attention was then turned to the femur.  With the leg externally rotated to 120 degrees, extended and adducted, we were able to use our Mueller retractor medially and a Hohmann retractor behind the greater trochanter.  We released the lateral joint capsule and used a box cutting osteotome to enter the femoral canal and a rongeur to lateralize.  We then began broaching from size 8 broach up to a size 12.  With a size 12 in place, we trialed a standard offset femoral  neck and a 49/28+ 1 bipolar hip ball construct and we reduced this in the acetabulum and we were pleased with stability, leg length, and offset as well as range of motion.  We then dislocated the hip and removed the trial components.  We were able to place the real Corail size 12 femoral component with standard offset and the real bipolar 49/28+ 1 head.  We reduced this in the acetabulum. Again, we were pleased with stability.  We irrigated the soft tissue with normal saline solution.  We closed the joint capsule with interrupted #1 Ethibond suture followed by running #1 Vicryl in the tensor fascia, 0 Vicryl in the deep tissue, 2-0 Vicryl in the subcutaneous tissue and interrupted staples on the skin.  Xeroform and Aquacel dressing were applied.  She was then taken off the Hana table, awakened, extubated, and taken to the recovery room in guarded condition.  All final counts were correct.  There were no complications noted.     Vanita Pandahristopher Y. Magnus [name], M.D.     CYB/MEDQ  D:  01/14/2016  T:  01/18/2016  Job:  [id number]

## 2016-01-24 NOTE — Progress Notes (Signed)
Patient ID: Jacqueline GableMary E Myrick, female   DOB: 10/11/1916, 82 y.o.   MRN: [id number] Seems to be breathing slightly better this am.  Last evenings chest film showed worsening of pulmonary congestion.  On nasal cannula this am.  Will proceed with surgery late today if medically stabilizes and lung situation improves.

## 2016-01-24 NOTE — Progress Notes (Signed)
Echocardiogram 2D Echocardiogram has been performed.  Jacqueline BasemanReel, Jacqueline [name] M 03/05/2016, 10:35 AM

## 2016-01-24 NOTE — Progress Notes (Addendum)
PROGRESS NOTE                                                                                                                                                                                                             Patient Demographics:    Jacqueline [name], is a 82 y.o. female, DOB - 01/27/1917, ZOX:[id number]  Admit date - July 09, 2015   Admitting Physician Therisa DoyneAnastassia Doutova, MD  Outpatient Primary MD for the patient is Pamelia HoitWILSON,FRED HENRY, MD  LOS - 2  Chief Complaint  Patient presents with  . Fall  . Hip Pain       Brief Narrative   Jacqueline [name] is a 82 y.o. female with medical history significant of DM, diastolic heart failure, gout, HTN, A. fib not on anticoagulation, hypothyroidism, allergic stenosis mild per echogram in 2011, pulmonary hypertension  Chronic pleural effusion, non-ST elevated MI in 2014  Presented with mechanical fall the patient was trying to reach for her wall car and found because her feet get tripped up. She fell on the right side. Call for help. Denies LOC. Patient was not sure she had her head or not. She is on aspirin for history of atrial fibrillation not on anticoagulation unable to ambulate after the fall did see the thickened right hip pain EMS was called she was given 50 g of fentanyl. Patient denies any shortness of breath no syncope no chest pain she is not on oxygen at her baseline  Regarding pertinent Chronic problems: Patient is known history of atrial fibrillation but her anticoagulation has been stopped secondary to fall risk. Patient is known history of coronary artery disease last cardiac catheterization was in February 2014   she required DES EF 65-70 percent she does have diastolic heart failure. She is on Lasix 40 mg a day  In November patient had to be admitted for mechanical fall she was found to have bilateral pleural effusions she has undergone thoracentesis and diuresis to 4.5 L  dry weight was 167 pounds since then stabilized up to 174 pounds   IN ER:   workup showed right femoral neck fracture and chest x-ray showed vascular congestion.    Subjective:    Jacqueline StallMary [name] today has, No headache, No chest pain, No abdominal pain - No Nausea, No new weakness tingling or numbness, No Cough - Much improved SOB.  Assessment  & Plan :     1.Mechanical fall with closed right femoral neck fracture. Orthopedics on board, due for surgery later on 01/07/2016, seen by cardiology as well in the perioperative period.  Cardio-Pulm Risk stratification for surgery and recommendations to minimize the same:-  A.Cardio-Pulmonary Risk -  this patient is a high risk  for adverse Cardio-Pulmonary  Outcome  from surgery, the risks and benefits were discussed and acceptable to the patient, called son twice on the listed cell phone number no response.  Recommendations for optimizing Cardio-Pulmonary  Risk risk factors  1. Keep SBP<140, HR<85, use Lopressor 5mg  IV q4hrs PRN, or B.Blocker drip PRN. 2. Moniotr I&Os. 3. Minimal sedation and Narcotics. 4. Good pulmunary toilet. 5. PRN Nebs and as needed oxygen to keep Pox>90% 6. Hb>8, transfuse as needed- Lasix 10mg  IV after each unit PRBC Transfused.   B.Bleeding Risk - no previous surgical complications, no easy bruising,  Antiplate meds aspirin 81 mg prior to admission.   Lab Results  Component Value Date   PLT 115 (L) 01/07/2016                  Lab Results  Component Value Date   INR 1.40 04/17/2015   INR 1.37 04/16/2015   INR 1.03 08/02/2012      Will request Surgeon to please Order DVT prophylaxis of his/her choice, along with activity, weight bearing precautions and diet if appropriate.     2. Acute on chronic diastolic CHF. Last EF 60% in November 2016. Present echocardiogram pending. She is currently on IV Lasix and shortness of breath has considerably improved, continue supportive care with oxygen and nebulizer  treatments. Cardiology on board. Continue Coreg.  3. Chronic atrial fibrillation [place] vasc 2 score of 6. CAD. Symptom-free from this standpoint, on Cardizem and as needed IV Lopressor for rate control, not an intact evaluation due to fall risk. Chest pain-free. Mild troponin rise due to demand ischemia from #2 above non-ACS pattern, no further workup per cardiology.   4. Essential hypertension. Stable on combination of diuretic, coronary and ARB. As needed hydralazine and Lopressor also ordered.  5. Mild AS. Compensated. Recent echo pending.  6. Hypothyroidism. Continue Synthroid.    Family Communication  :  Left message for son on listed phone number twice at 10:50 AM on 01/09/2016  Code Status :  Full  Diet :  NPO for surgery  Disposition Plan  :  Step down  Consults  : Cards, Ortho  Procedures  :   TTE   DVT Prophylaxis  : SCDs   Lab Results  Component Value Date   PLT 115 (L) 01/29/2016    Inpatient Medications  Scheduled Meds: . carvedilol  12.5 mg Oral BID WC  . furosemide  40 mg Intravenous Q12H  . irbesartan  150 mg Oral Daily  . levothyroxine  50 mcg Intravenous Daily  . senna  1 tablet Oral BID  . sodium chloride flush  3 mL Intravenous Q12H  . sodium chloride flush  3 mL Intravenous Q12H   Continuous Infusions:  PRN Meds:.sodium chloride, 0.9 % irrigation (POUR BTL), acetaminophen **OR** acetaminophen, bisacodyl, hydrALAZINE, HYDROcodone-acetaminophen, methocarbamol **OR** methocarbamol (ROBAXIN)  IV, morphine injection, ondansetron **OR** ondansetron (ZOFRAN) IV, polyethylene glycol, sodium chloride flush  Antibiotics  :    Anti-infectives    None         Objective:   Vitals:   01/05/2016 0000 01/04/2016 0347 01/22/2016 0815 02/03/2016 0925  BP: 119/74 (!) 117/57  138/70  Pulse: 76 73  79  Resp: (!) 21 (!) 24    Temp:  97.4 F (36.3 C) 97.7 F (36.5 C)   TempSrc:  Oral Oral   SpO2: 96% 97%    Weight:  77.4 kg (170 lb 10.2 oz)    Height:         Wt Readings from Last 3 Encounters:  04/06/2016 77.4 kg (170 lb 10.2 oz)  12/13/15 78.8 kg (173 lb 12.8 oz)  08/12/15 84.6 kg (186 lb 8 oz)     Intake/Output Summary (Last 24 hours) at 04/06/2016 1056 Last data filed at 04/06/2016 0500  Gross per 24 hour  Intake                0 ml  Output             1425 ml  Net            -1425 ml     Physical Exam  Awake Alert,  No new F.N deficits, Normal affect [place].AT,PERRAL Supple Neck,No JVD, No cervical lymphadenopathy appriciated.  Symmetrical Chest wall movement, Good air movement bilaterally, few rales RRR,No Gallops,Rubs or new Murmurs, No Parasternal Heave +ve B.Sounds, Abd Soft, No tenderness, No organomegaly appriciated, No rebound - guarding or rigidity. No Cyanosis, Clubbing or edema, No new Rash or bruise     Data Review:    CBC  Recent Labs Lab 01/29/2016 2129 01/23/16 0553 04/06/2016 0303  WBC 13.0* 11.4* 15.0*  HGB 14.4 14.3 14.1  HCT 43.4 44.5 43.8  PLT 137* 146* 115*  MCV 92.1 94.7 94.8  MCH 30.6 30.4 30.5  MCHC 33.2 32.1 32.2  RDW 13.9 13.8 14.2  LYMPHSABS 0.7  --   --   MONOABS 0.5  --   --   EOSABS 0.1  --   --   BASOSABS 0.0  --   --     Chemistries   Recent Labs Lab 01/07/2016 2129 01/23/16 0553 04/06/2016 0303  NA 137 140 138  K 3.9 3.9 3.5  CL 106 102 105  CO2 23 25 25   GLUCOSE 141* 162* 144*  BUN 22* 20 27*  CREATININE 0.96 1.13* 1.29*  CALCIUM 8.7* 9.2 8.9  MG  --  2.1  --   AST  --  25 21  ALT  --  18 16  ALKPHOS  --  104 79  BILITOT  --  1.2 1.2   ------------------------------------------------------------------------------------------------------------------ No results for input(s): CHOL, HDL, LDLCALC, TRIG, CHOLHDL, LDLDIRECT in the last 72 hours.  Lab Results  Component Value Date   HGBA1C 5.9 (H) 07/31/2012   ------------------------------------------------------------------------------------------------------------------  Recent Labs  01/23/16 0553  TSH 1.340    ------------------------------------------------------------------------------------------------------------------ No results for input(s): VITAMINB12, FOLATE, FERRITIN, TIBC, IRON, RETICCTPCT in the last 72 hours.  Coagulation profile No results for input(s): INR, PROTIME in the last 168 hours.  No results for input(s): DDIMER in the last 72 hours.  Cardiac Enzymes  Recent Labs Lab 01/05/2016 2301 01/23/16 0553  TROPONINI 0.03* 0.07*   ------------------------------------------------------------------------------------------------------------------    Component Value Date/Time   BNP 259.9 (H) 01/14/2016 2129    Micro Results Recent Results (from the past 240 hour(s))  MRSA PCR Screening     Status: None   Collection Time: 01/23/16  5:40 AM  Result Value Ref Range Status   MRSA by PCR NEGATIVE NEGATIVE Final    Comment:        The GeneXpert MRSA Assay (FDA approved for NASAL  specimens only), is one component of a comprehensive MRSA colonization surveillance program. It is not intended to diagnose MRSA infection nor to guide or monitor treatment for MRSA infections.   Surgical pcr screen     Status: None   Collection Time: 01/23/16  4:43 PM  Result Value Ref Range Status   MRSA, PCR NEGATIVE NEGATIVE Final   Staphylococcus aureus NEGATIVE NEGATIVE Final    Comment:        The Xpert SA Assay (FDA approved for NASAL specimens in patients over 64 years of age), is one component of a comprehensive surveillance program.  Test performance has been validated by [facility] for patients greater than or equal to 21 year old. It is not intended to diagnose infection nor to guide or monitor treatment.     Radiology Reports Dg Chest 2 View  Result Date: 01/31/2016 CLINICAL DATA:  Fall.  Right hip pain.  No reported back pain. EXAM: CHEST  2 VIEW COMPARISON:  04/17/2015 FINDINGS: Bilateral pleural effusions also seen on prior exam. Mild pulmonary venous congestion.  No convincing consolidation. No pneumothorax. Chronic cardiomegaly. Mediastinal contours are distorted by rotation. No visible fracture. Exaggerated thoracic kyphosis. Cannot exclude lower thoracic compression deformities, limited by overlapping shadows, but no reported back pain. IMPRESSION: Chronic bilateral pleural effusion, borderline moderate on the right. Pulmonary venous congestion. Electronically Signed   By: Marnee Spring M.D.   On: 01/05/2016 21:10   Ct Head Wo Contrast  Result Date: 01/28/2016 CLINICAL DATA:  Unwitnessed fall headache and neck pain. EXAM: CT HEAD WITHOUT CONTRAST CT CERVICAL SPINE WITHOUT CONTRAST TECHNIQUE: Multidetector CT imaging of the head and cervical spine was performed following the standard protocol without intravenous contrast. Multiplanar CT image reconstructions of the cervical spine were also generated. COMPARISON:  06/15/2014 FINDINGS: CT HEAD FINDINGS Generalized age related atrophy. Mild chronic small-vessel ischemic change of the cerebral hemispheric white matter. No sign of acute infarction, mass lesion, hemorrhage, hydrocephalus or extra-axial collection. No skull fracture. No fluid in the sinuses. There is atherosclerotic calcification of the major vessels at the base of the brain. CT CERVICAL SPINE FINDINGS No malalignment. No visible soft tissue swelling. No fracture. Chronic facet fusion on the right at C4-5. Mild degenerative changes above and below that. Nitrogen gas in the disc spaces of C5-6 and C6-7 is degenerative and was present on the previous study. Chronic thyroid goiter again demonstrated. Some pleural fluid bilaterally. IMPRESSION: Head CT: Atrophy and chronic small vessel ischemic change. No acute or traumatic finding. CT cervical spine: No acute or traumatic finding. Mild degenerative changes. Electronically Signed   By: Paulina Fusi M.D.   On: 01/23/2016 22:36   Ct Cervical Spine Wo Contrast  Result Date: 01/09/2016 CLINICAL DATA:   Unwitnessed fall headache and neck pain. EXAM: CT HEAD WITHOUT CONTRAST CT CERVICAL SPINE WITHOUT CONTRAST TECHNIQUE: Multidetector CT imaging of the head and cervical spine was performed following the standard protocol without intravenous contrast. Multiplanar CT image reconstructions of the cervical spine were also generated. COMPARISON:  06/15/2014 FINDINGS: CT HEAD FINDINGS Generalized age related atrophy. Mild chronic small-vessel ischemic change of the cerebral hemispheric white matter. No sign of acute infarction, mass lesion, hemorrhage, hydrocephalus or extra-axial collection. No skull fracture. No fluid in the sinuses. There is atherosclerotic calcification of the major vessels at the base of the brain. CT CERVICAL SPINE FINDINGS No malalignment. No visible soft tissue swelling. No fracture. Chronic facet fusion on the right at C4-5. Mild degenerative changes above and below  that. Nitrogen gas in the disc spaces of C5-6 and C6-7 is degenerative and was present on the previous study. Chronic thyroid goiter again demonstrated. Some pleural fluid bilaterally. IMPRESSION: Head CT: Atrophy and chronic small vessel ischemic change. No acute or traumatic finding. CT cervical spine: No acute or traumatic finding. Mild degenerative changes. Electronically Signed   By: Paulina Fusi M.D.   On: 03-Feb-2016 22:36   Dg Chest Port 1 View  Result Date: 01/23/2016 CLINICAL DATA:  Pulmonary edema EXAM: PORTABLE CHEST 1 VIEW COMPARISON:  01/12/2016 FINDINGS: Cardiac shadow is stable but mildly enlarged. Aortic calcifications are again seen. Vascular congestion and right-sided pleural effusion are noted. Stable right basilar infiltrate is seen. No bony abnormality is noted. IMPRESSION: Increasing vascular congestion when compare with the prior exam. Stable changes in the right base consistent with effusion and infiltrate. Electronically Signed   By: Alcide Clever M.D.   On: 01/23/2016 17:14   Dg Hip Unilat W Or Wo Pelvis  2-3 Views Right  Result Date: February 03, 2016 CLINICAL DATA:  83 year old female status post unwitnessed fall, found down. Right hip pain. Initial encounter. EXAM: DG HIP (WITH OR WITHOUT PELVIS) 2-3V RIGHT COMPARISON:  None. FINDINGS: Right femoral neck fracture with varus impaction. The intertrochanteric region appears spared. The right femoral head remains located within the acetabulum. Pelvis appears intact. SI joints within normal limits. Grossly intact proximal left femur. Dystrophic appearing calcifications adjacent to the left greater trochanter. Iliac artery atherosclerosis that iliac artery calcified atherosclerosis. IMPRESSION: Acute right femoral neck fracture with varus impaction. Electronically Signed   By: Odessa Fleming M.D.   On: Feb 03, 2016 21:08    Time Spent in minutes  30   SINGH,PRASHANT K M.D on 01/27/2016 at 10:56 AM  Between 7am to 7pm - Pager - [phone]  After 7pm go to [email or web address] - password [facility]  [facility] -  Office  [phone]

## 2016-01-24 NOTE — Anesthesia Procedure Notes (Addendum)
Procedure Name: Intubation Date/Time: 01/28/2016 5:26 PM Performed by: Faustino CongressWHITE, Jakhai Fant TENA Tahisha Hakim Pre-anesthesia Checklist: Patient identified, Emergency Drugs available, Suction available and Patient being monitored Patient Re-evaluated:Patient Re-evaluated prior to inductionOxygen Delivery Method: Circle System Utilized Preoxygenation: Pre-oxygenation with 100% oxygen Intubation Type: IV induction Ventilation: Mask ventilation without difficulty Laryngoscope Size: Mac and 3 Grade View: Grade III Tube type: Oral Tube size: 7.5 mm Number of attempts: 1 Airway Equipment and Method: Stylet Placement Confirmation: ETT inserted through vocal cords under direct vision,  positive ETCO2 and breath sounds checked- equal and bilateral Secured at: 23 cm Tube secured with: Tape Dental Injury: Teeth and Oropharynx as per pre-operative assessment

## 2016-01-24 NOTE — Transfer of Care (Signed)
Immediate Anesthesia Transfer of Care Note  Patient: Jacqueline [name]  Procedure(s) Performed: Procedure(s): HEMI- ARTHROPLASTY ANTERIOR APPROACH (Right)  Patient Location: PACU  Anesthesia Type:General  Level of Consciousness: sedated  Airway & Oxygen Therapy: Patient Spontanous Breathing and Patient connected to nasal cannula oxygen  Post-op Assessment: Report given to RN, Post -op Vital signs reviewed and stable and Patient moving all extremities  Post vital signs: Reviewed and stable  Last Vitals:  Vitals:   01/31/2016 0925 01/11/2016 1150  BP: 138/70 (!) 104/55  Pulse: 79 70  Resp:  (!) 21  Temp:  36.6 C    Last Pain:  Vitals:   01/11/2016 1150  TempSrc: Oral  PainSc:       Patients Stated Pain Goal: 2 (01/12/2016 0926)  Complications: No apparent anesthesia complications

## 2016-01-24 NOTE — Anesthesia Postprocedure Evaluation (Signed)
Anesthesia Post Note  Patient: Lucely E Mulroy  Procedure(s) Performed: Procedure(s) (LRB): HEMI- ARTHROPLASTY ANTERIOR APPROACH (Right)  Patient location during evaluation: PACU Anesthesia Type: General Level of consciousness: awake and alert Pain management: pain level controlled Vital Signs Assessment: post-procedure vital signs reviewed and stable Respiratory status: spontaneous breathing, nonlabored ventilation, respiratory function stable and patient connected to nasal cannula oxygen Cardiovascular status: blood pressure returned to baseline and stable Postop Assessment: no signs of nausea or vomiting Anesthetic complications: no    Last Vitals:  Vitals:   05-02-2016 1911 05-02-2016 1925  BP: 140/63 137/86  Pulse: 85 84  Resp: (!) 21 16  Temp:      Last Pain:  Vitals:   05-02-2016 1925  TempSrc:   PainSc: 0-No pain                 Lucine Bilski,JAMES TERRILL

## 2016-01-25 ENCOUNTER — Inpatient Hospital Stay (HOSPITAL_COMMUNITY): Payer: Medicare Other

## 2016-01-25 ENCOUNTER — Inpatient Hospital Stay (HOSPITAL_COMMUNITY): Payer: Medicare Other | Admitting: Anesthesiology

## 2016-01-25 ENCOUNTER — Encounter (HOSPITAL_COMMUNITY): Payer: Self-pay | Admitting: Orthopaedic Surgery

## 2016-01-25 ENCOUNTER — Encounter (HOSPITAL_COMMUNITY): Admission: EM | Disposition: E | Payer: Self-pay | Source: Home / Self Care | Attending: Neurology

## 2016-01-25 DIAGNOSIS — J9601 Acute respiratory failure with hypoxia: Secondary | ICD-10-CM

## 2016-01-25 DIAGNOSIS — R0902 Hypoxemia: Secondary | ICD-10-CM

## 2016-01-25 DIAGNOSIS — I5033 Acute on chronic diastolic (congestive) heart failure: Secondary | ICD-10-CM

## 2016-01-25 DIAGNOSIS — J90 Pleural effusion, not elsewhere classified: Secondary | ICD-10-CM

## 2016-01-25 DIAGNOSIS — I63412 Cerebral infarction due to embolism of left middle cerebral artery: Secondary | ICD-10-CM | POA: Diagnosis present

## 2016-01-25 HISTORY — PX: RADIOLOGY WITH ANESTHESIA: SHX6223

## 2016-01-25 HISTORY — PX: IR GENERIC HISTORICAL: IMG1180011

## 2016-01-25 LAB — CBC
HEMATOCRIT: 39.7 % (ref 36.0–46.0)
HEMOGLOBIN: 12.6 g/dL (ref 12.0–15.0)
MCH: 30.1 pg (ref 26.0–34.0)
MCHC: 31.7 g/dL (ref 30.0–36.0)
MCV: 95 fL (ref 78.0–100.0)
Platelets: 93 10*3/uL — ABNORMAL LOW (ref 150–400)
RBC: 4.18 MIL/uL (ref 3.87–5.11)
RDW: 14.2 % (ref 11.5–15.5)
WBC: 8.6 10*3/uL (ref 4.0–10.5)

## 2016-01-25 LAB — BASIC METABOLIC PANEL
Anion gap: 6 (ref 5–15)
BUN: 37 mg/dL — AB (ref 6–20)
CALCIUM: 8.4 mg/dL — AB (ref 8.9–10.3)
CHLORIDE: 103 mmol/L (ref 101–111)
CO2: 26 mmol/L (ref 22–32)
CREATININE: 1.2 mg/dL — AB (ref 0.44–1.00)
GFR calc non Af Amer: 37 mL/min — ABNORMAL LOW (ref >60)
GFR, EST AFRICAN AMERICAN: 42 mL/min — AB (ref >60)
Glucose, Bld: 112 mg/dL — ABNORMAL HIGH (ref 65–99)
Potassium: 3.7 mmol/L (ref 3.5–5.1)
Sodium: 135 mmol/L (ref 135–145)

## 2016-01-25 LAB — MAGNESIUM: MAGNESIUM: 2.1 mg/dL (ref 1.7–2.4)

## 2016-01-25 LAB — GLUCOSE, CAPILLARY: GLUCOSE-CAPILLARY: 112 mg/dL — AB (ref 65–99)

## 2016-01-25 LAB — POCT I-STAT 3, ART BLOOD GAS (G3+)
ACID-BASE EXCESS: 1 mmol/L (ref 0.0–2.0)
Bicarbonate: 26.1 mEq/L — ABNORMAL HIGH (ref 20.0–24.0)
O2 SAT: 95 %
PCO2 ART: 44.5 mmHg (ref 35.0–45.0)
PH ART: 7.375 (ref 7.350–7.450)
TCO2: 27 mmol/L (ref 0–100)
pO2, Arterial: 79 mmHg — ABNORMAL LOW (ref 80.0–100.0)

## 2016-01-25 LAB — PROTIME-INR
INR: 1.34
Prothrombin Time: 16.6 seconds — ABNORMAL HIGH (ref 11.4–15.2)

## 2016-01-25 LAB — HEMOGLOBIN A1C
HEMOGLOBIN A1C: 5.6 % (ref 4.8–5.6)
MEAN PLASMA GLUCOSE: 114 mg/dL

## 2016-01-25 SURGERY — RADIOLOGY WITH ANESTHESIA
Anesthesia: General

## 2016-01-25 MED ORDER — IOPAMIDOL (ISOVUE-300) INJECTION 61%
INTRAVENOUS | Status: AC
  Administered 2016-01-25: 75 mL
  Filled 2016-01-25: qty 100

## 2016-01-25 MED ORDER — ROCURONIUM BROMIDE 100 MG/10ML IV SOLN
INTRAVENOUS | Status: DC | PRN
  Administered 2016-01-25: 30 mg via INTRAVENOUS
  Administered 2016-01-25: 50 mg via INTRAVENOUS

## 2016-01-25 MED ORDER — SODIUM CHLORIDE 0.9 % IV SOLN
INTRAVENOUS | Status: DC
  Administered 2016-01-25 – 2016-01-28 (×2): via INTRAVENOUS

## 2016-01-25 MED ORDER — FENTANYL CITRATE (PF) 100 MCG/2ML IJ SOLN
25.0000 ug | INTRAMUSCULAR | Status: DC | PRN
  Administered 2016-01-26: 25 ug via INTRAVENOUS
  Administered 2016-01-26 – 2016-01-27 (×3): 50 ug via INTRAVENOUS
  Filled 2016-01-25 (×4): qty 2

## 2016-01-25 MED ORDER — PROPOFOL 1000 MG/100ML IV EMUL
5.0000 ug/kg/min | INTRAVENOUS | Status: DC
  Administered 2016-01-25: 20 ug/kg/min via INTRAVENOUS
  Administered 2016-01-26: 15 ug/kg/min via INTRAVENOUS
  Administered 2016-01-26: 10 ug/kg/min via INTRAVENOUS
  Administered 2016-01-27: 15 ug/kg/min via INTRAVENOUS
  Filled 2016-01-25 (×3): qty 100

## 2016-01-25 MED ORDER — PROPOFOL 500 MG/50ML IV EMUL
INTRAVENOUS | Status: DC | PRN
  Administered 2016-01-25: 25 ug/kg/min via INTRAVENOUS

## 2016-01-25 MED ORDER — ACETAMINOPHEN 650 MG RE SUPP: 650.0000 mg | Freq: Four times a day (QID) | RECTAL | Status: DC | PRN

## 2016-01-25 MED ORDER — LACTATED RINGERS IV SOLN
INTRAVENOUS | Status: DC | PRN
  Administered 2016-01-25: 12:00:00 via INTRAVENOUS

## 2016-01-25 MED ORDER — STROKE: EARLY STAGES OF RECOVERY BOOK
Freq: Once | Status: DC
  Filled 2016-01-25: qty 1

## 2016-01-25 MED ORDER — PHENYLEPHRINE HCL 10 MG/ML IJ SOLN
INTRAVENOUS | Status: DC | PRN
  Administered 2016-01-25: 50 ug/min via INTRAVENOUS

## 2016-01-25 MED ORDER — EPTIFIBATIDE 20 MG/10ML IV SOLN
INTRAVENOUS | Status: AC | PRN
  Administered 2016-01-25 (×3): 1.8 mg via INTRAVENOUS

## 2016-01-25 MED ORDER — GLYCOPYRROLATE 0.2 MG/ML IJ SOLN
INTRAMUSCULAR | Status: DC | PRN
  Administered 2016-01-25: 0.2 mg via INTRAVENOUS

## 2016-01-25 MED ORDER — NALOXONE HCL 0.4 MG/ML IJ SOLN
INTRAMUSCULAR | Status: AC
  Administered 2016-01-25: 0.2 mg
  Filled 2016-01-25: qty 1

## 2016-01-25 MED ORDER — SODIUM CHLORIDE 0.9 % IV SOLN
INTRAVENOUS | Status: DC
  Administered 2016-01-25: 14:00:00 via INTRAVENOUS

## 2016-01-25 MED ORDER — NITROGLYCERIN 1 MG/10 ML FOR IR/CATH LAB
INTRA_ARTERIAL | Status: AC
  Filled 2016-01-25: qty 10

## 2016-01-25 MED ORDER — ONDANSETRON HCL 4 MG/2ML IJ SOLN: 4.0000 mg | Freq: Four times a day (QID) | INTRAMUSCULAR | Status: DC | PRN

## 2016-01-25 MED ORDER — NICARDIPINE HCL IN NACL 20-0.86 MG/200ML-% IV SOLN
5.0000 mg/h | INTRAVENOUS | Status: DC
  Administered 2016-01-25: 5 mg/h via INTRAVENOUS
  Filled 2016-01-25: qty 200

## 2016-01-25 MED ORDER — LEVOTHYROXINE SODIUM 100 MCG PO TABS
100.0000 ug | ORAL_TABLET | Freq: Every day | ORAL | Status: DC
  Filled 2016-01-25: qty 1

## 2016-01-25 MED ORDER — PROPOFOL 10 MG/ML IV BOLUS
INTRAVENOUS | Status: DC | PRN
  Administered 2016-01-25: 40 mg via INTRAVENOUS

## 2016-01-25 MED ORDER — EPTIFIBATIDE 20 MG/10ML IV SOLN
INTRAVENOUS | Status: AC
  Filled 2016-01-25: qty 10

## 2016-01-25 MED ORDER — IOPAMIDOL (ISOVUE-300) INJECTION 61%
INTRAVENOUS | Status: AC
  Administered 2016-01-25: 75 mL
  Filled 2016-01-25: qty 150

## 2016-01-25 MED ORDER — SENNOSIDES-DOCUSATE SODIUM 8.6-50 MG PO TABS: 1.0000 | ORAL_TABLET | Freq: Every evening | ORAL | Status: DC | PRN

## 2016-01-25 MED ORDER — ANTISEPTIC ORAL RINSE SOLUTION (CORINZ)
7.0000 mL | OROMUCOSAL | Status: DC
  Administered 2016-01-25 – 2016-01-28 (×25): 7 mL via OROMUCOSAL

## 2016-01-25 MED ORDER — CHLORHEXIDINE GLUCONATE 0.12% ORAL RINSE (MEDLINE KIT)
15.0000 mL | Freq: Once | OROMUCOSAL | Status: AC
Start: 2016-01-25 — End: 2016-01-25
  Administered 2016-01-25: 15 mL via OROMUCOSAL

## 2016-01-25 MED ORDER — CHLORHEXIDINE GLUCONATE 0.12% ORAL RINSE (MEDLINE KIT)
15.0000 mL | Freq: Two times a day (BID) | OROMUCOSAL | Status: DC
  Administered 2016-01-25 – 2016-01-28 (×6): 15 mL via OROMUCOSAL

## 2016-01-25 MED ORDER — IOPAMIDOL (ISOVUE-370) INJECTION 76%
INTRAVENOUS | Status: AC
  Administered 2016-01-25: 50 mL
  Filled 2016-01-25: qty 50

## 2016-01-25 MED ORDER — ACETAMINOPHEN 500 MG PO TABS: 1000.0000 mg | ORAL_TABLET | Freq: Four times a day (QID) | ORAL | Status: DC | PRN

## 2016-01-25 MED ORDER — SODIUM CHLORIDE 0.9 % IJ SOLN
INTRAVENOUS | Status: AC | PRN
  Administered 2016-01-25: 25 ug via INTRA_ARTERIAL

## 2016-01-25 MED ORDER — PHENYLEPHRINE HCL 10 MG/ML IJ SOLN
INTRAMUSCULAR | Status: DC | PRN
Start: 2016-01-25 — End: 2016-01-25
  Administered 2016-01-25 (×2): 80 ug via INTRAVENOUS

## 2016-01-25 MED ORDER — FENTANYL CITRATE (PF) 100 MCG/2ML IJ SOLN
INTRAMUSCULAR | Status: DC | PRN
  Administered 2016-01-25: 100 ug via INTRAVENOUS

## 2016-01-25 MED ORDER — LIDOCAINE HCL (CARDIAC) 20 MG/ML IV SOLN
INTRAVENOUS | Status: DC | PRN
  Administered 2016-01-25: 50 mg via INTRAVENOUS

## 2016-01-25 NOTE — Progress Notes (Signed)
Patient ID: Jacqueline GableMary E [name], female   DOB: 12/05/1916, 82 y.o.   MRN: [id number] Appears comfortable this am.  Tolerated surgery on her right hip last evening.  Vitals stable.  Reports less hip pain than pre-op.  Can be up with therapy from Ortho standpoint with full weight bearing as tolerated right hip and no hip precautions.  Dressing on right hip clean and dry.  Lovenox for now for DVT coverage.

## 2016-01-25 NOTE — Progress Notes (Signed)
PROGRESS NOTE                                                                                                                                                                                                             Patient Demographics:    Jacqueline [name], is a 82 y.o. female, DOB - 1916-11-02, ZOX:[id number]  Admit date - 01/18/2016   Admitting Physician Therisa Doyne, MD  Outpatient Primary MD for the patient is Pamelia Hoit, MD  LOS - 3  Chief Complaint  Patient presents with  . Fall  . Hip Pain       Brief Narrative   Jacqueline [name] is a 82 y.o. female with medical history significant of DM, diastolic heart failure, gout, HTN, A. fib not on anticoagulation, hypothyroidism, allergic stenosis mild per echogram in 2011, pulmonary hypertension  Chronic pleural effusion, non-ST elevated MI in 2014  Presented with mechanical fall the patient was trying to reach for her wall car and found because her feet get tripped up. She fell on the right side. Call for help. Denies LOC. Patient was not sure she had her head or not. She is on aspirin for history of atrial fibrillation not on anticoagulation unable to ambulate after the fall did see the thickened right hip pain EMS was called she was given 50 g of fentanyl. Patient denies any shortness of breath no syncope no chest pain she is not on oxygen at her baseline  Regarding pertinent Chronic problems: Patient is known history of atrial fibrillation but her anticoagulation has been stopped secondary to fall risk. Patient is known history of coronary artery disease last cardiac catheterization was in February 2014   she required DES EF 65-70 percent she does have diastolic heart failure. She is on Lasix 40 mg a day  In November patient had to be admitted for mechanical fall she was found to have bilateral pleural effusions she has undergone thoracentesis and diuresis to 4.5 L  dry weight was 167 pounds since then stabilized up to 174 pounds   IN ER:   workup showed right femoral neck fracture and chest x-ray showed vascular congestion.    Subjective:    Jacqueline [name] today has, No headache, No chest pain, No abdominal pain - No Nausea, No new weakness tingling or numbness, No Cough - Much improved SOB.  Assessment  & Plan :     1.Mechanical fall with closed right femoral neck fracture. Orthopedics on board, post on 01/29/2016, seen by cardiology as well in the perioperative period. Had tolerated the procedure well, Lovenox for DVT prophylaxis and WBAT R leg, mild Perioperative blood loss related anemia we will monitor no transfusions yet. We'll require SNF.  2. Acute on chronic diastolic CHF. Last EF 60% in November 2016. Present echocardiogram pending. She is currently on IV Lasix and shortness of breath has considerably improved, continue supportive care with oxygen and nebulizer treatments. Cardiology on board. Continue Coreg.  3. Chronic atrial fibrillation [place]asc 2 score of 6. CAD. Symptom-free from this standpoint, on Cardizem and as needed IV Lopressor for rate control, not an intact evaluation due to fall risk. Chest pain-free. Mild troponin rise due to demand ischemia from #2 above non-ACS pattern, no further workup per cardiology.   4. Essential hypertension. Stable on combination of diuretic, coronary and ARB. As needed hydralazine and Lopressor also ordered.  5. Mild AS. Compensated. Recent echo noted and stable.  6. Hypothyroidism. Continue Synthroid.   Addendum. One hour after I saw the patient was paged by the nurse around 10 AM the patient had become less responsive, did not respond to Narcan, will get head CT if needed stroke protocol will be initiated and neuro will be called.    Family Communication  :  Left message for son on listed phone number twice at 10:50 AM on 01/06/2016  Code Status :  Full  Diet :  Heart Healthy Low  Carb  Disposition Plan  :  Step down  Consults  : Cards, Ortho  Procedures  :   TTE - Compared to a prior echo in 2016, there has been little change.  There is mild aortic stenosis with AVA of around 1.7 cm2. LVEF is   60-65%  ORIF R hip - 01/04/2016   DVT Prophylaxis  : SCDs   Lab Results  Component Value Date   PLT 93 (L) 2016-03-11    Inpatient Medications  Scheduled Meds: . carvedilol  12.5 mg Oral BID WC  . enoxaparin (LOVENOX) injection  30 mg Subcutaneous Q24H  . furosemide  40 mg Intravenous Q12H  . irbesartan  150 mg Oral Daily  . levothyroxine  100 mcg Oral QAC breakfast  . senna  1 tablet Oral BID  . sodium chloride flush  3 mL Intravenous Q12H  . sodium chloride flush  3 mL Intravenous Q12H   Continuous Infusions:  PRN Meds:.sodium chloride, acetaminophen **OR** acetaminophen, bisacodyl, hydrALAZINE, HYDROcodone-acetaminophen, menthol-cetylpyridinium **OR** phenol, methocarbamol **OR** methocarbamol (ROBAXIN)  IV, metoCLOPramide **OR** metoCLOPramide (REGLAN) injection, metoprolol, morphine injection, ondansetron **OR** ondansetron (ZOFRAN) IV, polyethylene glycol, sodium chloride flush  Antibiotics  :    Anti-infectives    Start     Dose/Rate Route Frequency Ordered Stop   01/19/2016 2330  ceFAZolin (ANCEF) IVPB 2g/100 mL premix     2 g 200 mL/hr over 30 Minutes Intravenous Every 6 hours 01/14/2016 1936 Oct 27, 2015 0558   01/28/2016 1657  ceFAZolin (ANCEF) 2-4 GM/100ML-% IVPB    Comments:  Orvilla FusCato, Sarah   : cabinet override      01/17/2016 1657 Oct 27, 2015 0459         Objective:   Vitals:   Oct 27, 2015 0800 Oct 27, 2015 0814 Oct 27, 2015 0900 Oct 27, 2015 0927  BP: (!) 121/96 (!) 115/57 107/82 131/78  Pulse: 78 83 83 77  Resp: 17 19 17  (!) 22  Temp:  98.2 F (  36.8 C)    TempSrc:  Axillary    SpO2: 96% 97% 97% 95%  Weight:      Height:        Wt Readings from Last 3 Encounters:  01/15/2016 77 kg (169 lb 12.1 oz)  12/13/15 78.8 kg (173 lb 12.8 oz)  08/12/15 84.6 kg (186 lb  8 oz)     Intake/Output Summary (Last 24 hours) at 01/21/2016 1001 Last data filed at 01/17/2016 0937  Gross per 24 hour  Intake              920 ml  Output             1065 ml  Net             -145 ml     Physical Exam  Awake Alert,  No new F.N deficits, Normal affect [place].AT,PERRAL Supple Neck,No JVD, No cervical lymphadenopathy appriciated.  Symmetrical Chest wall movement, Good air movement bilaterally, few rales RRR,No Gallops,Rubs or new Murmurs, No Parasternal Heave +ve B.Sounds, Abd Soft, No tenderness, No organomegaly appriciated, No rebound - guarding or rigidity. No Cyanosis, Clubbing or edema, No new Rash or bruise  R hip scar is stable    Data Review:    CBC  Recent Labs Lab 01/06/2016 2129 01/23/16 0553 01/17/2016 0303 01/04/2016 0832  WBC 13.0* 11.4* 15.0* 8.6  HGB 14.4 14.3 14.1 12.6  HCT 43.4 44.5 43.8 39.7  PLT 137* 146* 115* 93*  MCV 92.1 94.7 94.8 95.0  MCH 30.6 30.4 30.5 30.1  MCHC 33.2 32.1 32.2 31.7  RDW 13.9 13.8 14.2 14.2  LYMPHSABS 0.7  --   --   --   MONOABS 0.5  --   --   --   EOSABS 0.1  --   --   --   BASOSABS 0.0  --   --   --     Chemistries   Recent Labs Lab 01/16/2016 2129 01/23/16 0553 01/08/2016 0303 01/08/2016 0710  NA 137 140 138  --   K 3.9 3.9 3.5  --   CL 106 102 105  --   CO2 23 25 25   --   GLUCOSE 141* 162* 144*  --   BUN 22* 20 27*  --   CREATININE 0.96 1.13* 1.29*  --   CALCIUM 8.7* 9.2 8.9  --   MG  --  2.1  --  2.1  AST  --  25 21  --   ALT  --  18 16  --   ALKPHOS  --  104 79  --   BILITOT  --  1.2 1.2  --    ------------------------------------------------------------------------------------------------------------------ No results for input(s): CHOL, HDL, LDLCALC, TRIG, CHOLHDL, LDLDIRECT in the last 72 hours.  Lab Results  Component Value Date   HGBA1C 5.6 02/01/2016   ------------------------------------------------------------------------------------------------------------------  Recent Labs   01/23/16 0553  TSH 1.340   ------------------------------------------------------------------------------------------------------------------ No results for input(s): VITAMINB12, FOLATE, FERRITIN, TIBC, IRON, RETICCTPCT in the last 72 hours.  Coagulation profile No results for input(s): INR, PROTIME in the last 168 hours.  No results for input(s): DDIMER in the last 72 hours.  Cardiac Enzymes  Recent Labs Lab 01/25/2016 2301 01/23/16 0553  TROPONINI 0.03* 0.07*   ------------------------------------------------------------------------------------------------------------------    Component Value Date/Time   BNP 259.9 (H) 02/01/2016 2129    Micro Results Recent Results (from the past 240 hour(s))  MRSA PCR Screening     Status: None   Collection Time: 01/23/16  5:40 AM  Result Value Ref Range Status   MRSA by PCR NEGATIVE NEGATIVE Final    Comment:        The GeneXpert MRSA Assay (FDA approved for NASAL specimens only), is one component of a comprehensive MRSA colonization surveillance program. It is not intended to diagnose MRSA infection nor to guide or monitor treatment for MRSA infections.   Surgical pcr screen     Status: None   Collection Time: 01/23/16  4:43 PM  Result Value Ref Range Status   MRSA, PCR NEGATIVE NEGATIVE Final   Staphylococcus aureus NEGATIVE NEGATIVE Final    Comment:        The Xpert SA Assay (FDA approved for NASAL specimens in patients over 80 years of age), is one component of a comprehensive surveillance program.  Test performance has been validated by [facility] for patients greater than or equal to 58 year old. It is not intended to diagnose infection nor to guide or monitor treatment.     Radiology Reports Dg Chest 2 View  Result Date: 01/25/2016 CLINICAL DATA:  Fall.  Right hip pain.  No reported back pain. EXAM: CHEST  2 VIEW COMPARISON:  04/17/2015 FINDINGS: Bilateral pleural effusions also seen on prior exam. Mild  pulmonary venous congestion. No convincing consolidation. No pneumothorax. Chronic cardiomegaly. Mediastinal contours are distorted by rotation. No visible fracture. Exaggerated thoracic kyphosis. Cannot exclude lower thoracic compression deformities, limited by overlapping shadows, but no reported back pain. IMPRESSION: Chronic bilateral pleural effusion, borderline moderate on the right. Pulmonary venous congestion. Electronically Signed   By: Marnee Spring M.D.   On: 01/31/2016 21:10   Ct Head Wo Contrast  Result Date: 01/13/2016 CLINICAL DATA:  Unwitnessed fall headache and neck pain. EXAM: CT HEAD WITHOUT CONTRAST CT CERVICAL SPINE WITHOUT CONTRAST TECHNIQUE: Multidetector CT imaging of the head and cervical spine was performed following the standard protocol without intravenous contrast. Multiplanar CT image reconstructions of the cervical spine were also generated. COMPARISON:  06/15/2014 FINDINGS: CT HEAD FINDINGS Generalized age related atrophy. Mild chronic small-vessel ischemic change of the cerebral hemispheric white matter. No sign of acute infarction, mass lesion, hemorrhage, hydrocephalus or extra-axial collection. No skull fracture. No fluid in the sinuses. There is atherosclerotic calcification of the major vessels at the base of the brain. CT CERVICAL SPINE FINDINGS No malalignment. No visible soft tissue swelling. No fracture. Chronic facet fusion on the right at C4-5. Mild degenerative changes above and below that. Nitrogen gas in the disc spaces of C5-6 and C6-7 is degenerative and was present on the previous study. Chronic thyroid goiter again demonstrated. Some pleural fluid bilaterally. IMPRESSION: Head CT: Atrophy and chronic small vessel ischemic change. No acute or traumatic finding. CT cervical spine: No acute or traumatic finding. Mild degenerative changes. Electronically Signed   By: Paulina Fusi M.D.   On: 01/19/2016 22:36   Ct Cervical Spine Wo Contrast  Result Date:  01/07/2016 CLINICAL DATA:  Unwitnessed fall headache and neck pain. EXAM: CT HEAD WITHOUT CONTRAST CT CERVICAL SPINE WITHOUT CONTRAST TECHNIQUE: Multidetector CT imaging of the head and cervical spine was performed following the standard protocol without intravenous contrast. Multiplanar CT image reconstructions of the cervical spine were also generated. COMPARISON:  06/15/2014 FINDINGS: CT HEAD FINDINGS Generalized age related atrophy. Mild chronic small-vessel ischemic change of the cerebral hemispheric white matter. No sign of acute infarction, mass lesion, hemorrhage, hydrocephalus or extra-axial collection. No skull fracture. No fluid in the sinuses. There is atherosclerotic calcification of the  major vessels at the base of the brain. CT CERVICAL SPINE FINDINGS No malalignment. No visible soft tissue swelling. No fracture. Chronic facet fusion on the right at C4-5. Mild degenerative changes above and below that. Nitrogen gas in the disc spaces of C5-6 and C6-7 is degenerative and was present on the previous study. Chronic thyroid goiter again demonstrated. Some pleural fluid bilaterally. IMPRESSION: Head CT: Atrophy and chronic small vessel ischemic change. No acute or traumatic finding. CT cervical spine: No acute or traumatic finding. Mild degenerative changes. Electronically Signed   By: Paulina Fusi M.D.   On: 01/31/2016 22:36   Pelvis Portable  Result Date: February 01, 2016 CLINICAL DATA:  Postop film for right hip arthroplasty. EXAM: PORTABLE PELVIS 1-2 VIEWS COMPARISON:  None. FINDINGS: Right hip arthroplasty hardware appears appropriately positioned. Expected postsurgical changes within the overlying soft tissues. Adjacent osseous pelvis appears intact and normally aligned. IMPRESSION: Expected postsurgical changes status post right hip arthroplasty. Hardware appears appropriately positioned. No evidence of surgical complicating feature. Electronically Signed   By: Bary Richard M.D.   On: 02-01-16  19:07   Dg Chest Port 1 View  Result Date: 01/23/2016 CLINICAL DATA:  Pulmonary edema EXAM: PORTABLE CHEST 1 VIEW COMPARISON:  01/12/2016 FINDINGS: Cardiac shadow is stable but mildly enlarged. Aortic calcifications are again seen. Vascular congestion and right-sided pleural effusion are noted. Stable right basilar infiltrate is seen. No bony abnormality is noted. IMPRESSION: Increasing vascular congestion when compare with the prior exam. Stable changes in the right base consistent with effusion and infiltrate. Electronically Signed   By: Alcide Clever M.D.   On: 01/23/2016 17:14   Dg C-arm 1-60 Min  Result Date: February 01, 2016 CLINICAL DATA:  Right anterior hip replacement. EXAM: OPERATIVE RIGHT HIP (WITH PELVIS IF PERFORMED)  VIEWS TECHNIQUE: Fluoroscopic spot image(s) were submitted for interpretation post-operatively. COMPARISON:  None. FINDINGS: Two fluoroscopic spot images are provided showing placement of right hip arthroplasty hardware. Hardware appears appropriately positioned. No evidence of surgical complicating feature appreciated. 18 seconds of fluoroscopy provided for the exam. IMPRESSION: Intraoperative fluoroscopic spot images showing placement of right hip arthroplasty hardware. 18 seconds of fluoroscopy provided. Electronically Signed   By: Bary Richard M.D.   On: 02/01/2016 18:38   Dg Hip Operative Unilat W Or W/o Pelvis Right  Result Date: 02/01/2016 CLINICAL DATA:  Right anterior hip replacement. EXAM: OPERATIVE RIGHT HIP (WITH PELVIS IF PERFORMED)  VIEWS TECHNIQUE: Fluoroscopic spot image(s) were submitted for interpretation post-operatively. COMPARISON:  None. FINDINGS: Two fluoroscopic spot images are provided showing placement of right hip arthroplasty hardware. Hardware appears appropriately positioned. No evidence of surgical complicating feature appreciated. 18 seconds of fluoroscopy provided for the exam. IMPRESSION: Intraoperative fluoroscopic spot images showing placement of  right hip arthroplasty hardware. 18 seconds of fluoroscopy provided. Electronically Signed   By: Bary Richard M.D.   On: February 01, 2016 18:38   Dg Hip Unilat W Or Wo Pelvis 2-3 Views Right  Result Date: 01/13/2016 CLINICAL DATA:  82 year old female status post unwitnessed fall, found down. Right hip pain. Initial encounter. EXAM: DG HIP (WITH OR WITHOUT PELVIS) 2-3V RIGHT COMPARISON:  None. FINDINGS: Right femoral neck fracture with varus impaction. The intertrochanteric region appears spared. The right femoral head remains located within the acetabulum. Pelvis appears intact. SI joints within normal limits. Grossly intact proximal left femur. Dystrophic appearing calcifications adjacent to the left greater trochanter. Iliac artery atherosclerosis that iliac artery calcified atherosclerosis. IMPRESSION: Acute right femoral neck fracture with varus impaction. Electronically Signed   By:  Odessa FlemingH  Hall M.D.   On: 01/14/2016 21:08    Time Spent in minutes  30   Yehonatan Grandison K M.D on Jul 08, 2015 at 10:01 AM  Between 7am to 7pm - Pager - [phone]  After 7pm go to [email or web address] - password [email or web address]  [facility] -  Office  [phone]

## 2016-01-25 NOTE — Care Management Note (Signed)
Case Management Note  Patient Details  Name: Jacqueline GableMary E [name] MRN: [id number] Date of Birth: 06/01/1917  Subjective/Objective: Pt admitted on 01/23/2016 s/p fall with Rt hip fx, now with Lt MCA stroke.  PTA, [facility]                    Action/Plan: CSW consulted to facilitate likely return to SNF upon medical stability.  Will follow progress.    Expected Discharge Date:            Expected Discharge Plan:  Skilled Nursing Facility  In-House Referral:  Clinical Social Work  Discharge planning Services  CM Consult  Post Acute Care Choice:    Choice offered to:     DME Arranged:    DME Agency:     HH Arranged:    HH Agency:     Status of Service:  In process, will continue to follow  If discussed at Long Length of Stay Meetings, dates discussed:    Additional Comments:  Quintella BatonJulie W. Que Meneely, RN, BSN  Trauma/Neuro ICU Case Manager [phone]

## 2016-01-25 NOTE — Anesthesia Procedure Notes (Signed)
Procedure Name: Intubation Date/Time: 01/29/2016 12:10 PM Performed by: Dorie RankQUINN, Courtney Fenlon M Pre-anesthesia Checklist: Patient identified, Emergency Drugs available, Suction available, Patient being monitored and Timeout performed Patient Re-evaluated:Patient Re-evaluated prior to inductionOxygen Delivery Method: Circle system utilized Preoxygenation: Pre-oxygenation with 100% oxygen Intubation Type: IV induction and Cricoid Pressure applied Laryngoscope Size: Mac and 4 Grade View: Grade III Tube type: Subglottic suction tube Tube size: 7.5 mm Number of attempts: 1 Airway Equipment and Method: Stylet Placement Confirmation: ETT inserted through vocal cords under direct vision,  positive ETCO2 and breath sounds checked- equal and bilateral Secured at: 22 cm Tube secured with: Tape Difficulty Due To: Difficulty was anticipated, Difficult Airway- due to anterior larynx and Difficult Airway- due to dentition Future Recommendations: Recommend- induction with short-acting agent, and alternative techniques readily available

## 2016-01-25 NOTE — H&P (Signed)
H&P    Chief Complaint:  Stroke    HPI:                                                                                                                                         Jacqueline GableMary E [name] is an 82 y.o. female with atrial fibrillation but is on no anticoagulation secondary to fall risks. Due to a fall patient had broke her right hip and initially presented to the hospital for a right hip hemiarthroplasty. Patient was doing well and was last seen normal at 8:30 this morning. One nurse came around again for morning checks she was noted to have a left gaze deviation right facial droop, right arm and right leg flaccidity and unable to communicate. A code stroke was called and patient was brought to CT scan which was normal however CTA of head and neck showed a cutoff at the M1 segment of the MCA on the left. Family was spoken to and they are in agreement to have a angiogram and possible intervention by IR. Patient was brought directly to interventional radiology.    Date last known well: Date: 2015-07-10 Time last known well: Time: 08:30 tPA Given: No: surgery No Symptoms         0 No significant disability/able to carry out all usual activities   1 Unable to carry out all previous activities but looks after own affairs             2 Requires help but walks without assistance     3 Unable to walk without assistance/unable to handle own bodily needs 4 Bedridden/incontinent        5 Dead          6  Modified Rankin: Rankin Score=1    Past Medical History:  Diagnosis Date  . Bilateral leg weakness    chronic  . CAD (coronary artery disease)    a. 2-08/2012: Cath/PCI: LM 10-20d, LAD 60-70p, 2349m (Rotablator->2.5x32 Promus Premier DES), D1 70-80ost, D2- small-90 ost/95p, LCX large, OM1 40p, OM2 small 80-90, RCA dominant 4615m, Ef 65-70%.  . Diabetes mellitus   . Diastolic dysfunction   . Gout   . Hyperlipidemia   . Hypertension   . Hypothyroidism   . Mild aortic stenosis    a.  01/2010 Echo: Nl EF, mild AS/AI, mild LAE, mild PAH.  Marland Kitchen. Permanent atrial fibrillation (HCC)    chads2vasc score is at least 6.  not a candidate for anticoagulation due to falls  . Shortness of breath    with chf & lying flat    Past Surgical History:  Procedure Laterality Date  . Breast biopsies    . CATARACT EXTRACTION    . CORONARY ANGIOPLASTY WITH STENT PLACEMENT  march 2014  . LEFT HEART CATHETERIZATION WITH CORONARY ANGIOGRAM N/A 08/02/2012   Procedure: LEFT HEART CATHETERIZATION WITH CORONARY ANGIOGRAM;  Surgeon: Peter M SwazilandJordan,  MD;  Location: [facility] CATH LAB;  Service: Cardiovascular;  Laterality: N/A;  . Left knee replacement    . PERCUTANEOUS CORONARY ROTOBLATOR INTERVENTION (PCI-R) N/A 08/05/2012   Procedure: PERCUTANEOUS CORONARY ROTOBLATOR INTERVENTION (PCI-R);  Surgeon: Peter M Swaziland, MD;  Location: [facility] CATH LAB;  Service: Cardiovascular;  Laterality: N/A;  . TOTAL HIP ARTHROPLASTY Right 01/08/2016   Procedure: HEMI- ARTHROPLASTY ANTERIOR APPROACH;  Surgeon: Kathryne Hitch, MD;  Location: [facility] OR;  Service: Orthopedics;  Laterality: Right;    Family History  Problem Relation Age of Onset  . Alzheimer's disease Brother   . Heart disease Father 40   Social History:  reports that she has never smoked. She has never used smokeless tobacco. She reports that she does not drink alcohol or use drugs.  Allergies:  Allergies  Allergen Reactions  . Aspirin     Pt states she cannot have aspirin due to Eliquis  . Amlodipine Swelling    Medications:                                                                                                                           Prior to Admission:  Prescriptions Prior to Admission  Medication Sig Dispense Refill Last Dose  . acetaminophen (TYLENOL) 325 MG tablet Take 650 mg by mouth every 6 (six) hours as needed for pain.   01/23/2016 at Unknown time  . aspirin EC 81 MG tablet Take 1 tablet (81 mg total) by mouth daily.   01/23/2016 at  Unknown time  . furosemide (LASIX) 40 MG tablet Take 1 tablet (40 mg total) by mouth daily. 90 tablet 3 01/23/2016 at Unknown time  . levothyroxine (SYNTHROID, LEVOTHROID) 112 MCG tablet Take 112 mcg by mouth daily before breakfast.   01/23/2016 at Unknown time  . olmesartan (BENICAR) 20 MG tablet Take 20 mg by mouth daily.   01/23/2016 at Unknown time  . potassium chloride SA (K-DUR,KLOR-CON) 20 MEQ tablet Take 20 mEq by mouth daily.    01/23/2016 at Unknown time  . carvedilol (COREG) 25 MG tablet TAKE 1 TABLET TWICE A DAY WITH MEALS (Patient not taking: Reported on 01/23/2016) 180 tablet 2 Not Taking at Unknown time  . nitroGLYCERIN (NITROSTAT) 0.4 MG SL tablet Place 1 tablet (0.4 mg total) under the tongue every 5 (five) minutes as needed for chest pain. (Patient not taking: Reported on 01/23/2016) 30 tablet 6 Not Taking at Unknown time   Scheduled: .  stroke: mapping our early stages of recovery book   Does not apply Once  . carvedilol  12.5 mg Oral BID WC  . enoxaparin (LOVENOX) injection  30 mg Subcutaneous Q24H  . furosemide  40 mg Intravenous Q12H  . irbesartan  150 mg Oral Daily  . levothyroxine  100 mcg Oral QAC breakfast  . senna  1 tablet Oral BID  . sodium chloride flush  3 mL Intravenous Q12H  . sodium chloride flush  3 mL Intravenous Q12H  ROS:                                                                                                                                       History obtained from unobtainable from patient due to mental status    Neurologic Examination:                                                                                                      Blood pressure 131/78, pulse 77, temperature 98.2 F (36.8 C), temperature source Axillary, resp. rate (!) 22, height 5\' 4"  (1.626 m), weight 77 kg (169 lb 12.1 oz), SpO2 95 %.  HEENT-  Normocephalic, no lesions, without obvious abnormality.  Normal external eye and conjunctiva.  Normal TM's bilaterally.  Normal  auditory canals and external ears. Normal external nose, mucus membranes and septum.  Normal pharynx. Cardiovascular- irregularly irregular rhythm, pulses palpable throughout   Lungs- chest clear, no wheezing, rales, normal symmetric air entry Abdomen- normal findings: bowel sounds normal Extremities- no edema Lymph-no adenopathy palpable Musculoskeletal-no joint tenderness, deformity or swelling Skin-warm and dry, no hyperpigmentation, vitiligo, or suspicious lesions  Neurological Examination Mental Status: Patient does not respond to verbal commands she has a left gaze deviation and minimally withdraws to pain on the left. Cranial Nerves: II: No blink to threat on the right. pupils equal, round, reactive to light and accommodation III,IV, VI: Both eyelids are closed. However one eyelids are retracted she has a left gaze deviation that does not cross midline. V,VII: Right facial droop with no response to soft touch and pinprick bilaterally. VIII: Attempts to respond to verbal stimuli  Motor: Right : Upper extremity   0/5    Left:     Upper extremity   5/5  Lower extremity   2/5 Triple reflex   Lower extremity   5/5 Tone and bulk:normal tone throughout; no atrophy noted Sensory: Slightly withdraws to noxious stimuli on the left no withdrawal from pain on the right upper extremity and a triple reflex noted in the lower right extremity Deep Tendon Reflexes: 1+ and symmetric throughout Plantars: Right: downgoing   Left: downgoing Cerebellar: Unable to examine Gait: Not tested       Lab Results: Basic Metabolic Panel:  Recent Labs Lab 01/17/2016 2129 01/23/16 0553 02/01/2016 0303 June 11, 2015 0710  NA 137 140 138  --   K 3.9 3.9 3.5  --   CL 106 102 105  --   CO2 23 25 25   --  GLUCOSE 141* 162* 144*  --   BUN 22* 20 27*  --   CREATININE 0.96 1.13* 1.29*  --   CALCIUM 8.7* 9.2 8.9  --   MG  --  2.1  --  2.1  PHOS  --  4.2  --   --     Liver Function Tests:  Recent  Labs Lab 01/23/16 0553 01/23/2016 0303  AST 25 21  ALT 18 16  ALKPHOS 104 79  BILITOT 1.2 1.2  PROT 6.1* 5.8*  ALBUMIN 3.4* 3.1*   No results for input(s): LIPASE, AMYLASE in the last 168 hours. No results for input(s): AMMONIA in the last 168 hours.  CBC:  Recent Labs Lab 01/31/2016 2129 01/23/16 0553 01/06/2016 0303 01/20/2016 0832  WBC 13.0* 11.4* 15.0* 8.6  NEUTROABS 11.6*  --   --   --   HGB 14.4 14.3 14.1 12.6  HCT 43.4 44.5 43.8 39.7  MCV 92.1 94.7 94.8 95.0  PLT 137* 146* 115* 93*    Cardiac Enzymes:  Recent Labs Lab 01/15/2016 2301 01/23/16 0553  TROPONINI 0.03* 0.07*    Lipid Panel: No results for input(s): CHOL, TRIG, HDL, CHOLHDL, VLDL, LDLCALC in the last 168 hours.  CBG:  Recent Labs Lab 02/02/2016 1612 01/07/2016 1938 01/20/2016 0954  GLUCAP 104* 112* 112*    Microbiology: Results for orders placed or performed during the hospital encounter of 01/26/2016  MRSA PCR Screening     Status: None   Collection Time: 01/23/16  5:40 AM  Result Value Ref Range Status   MRSA by PCR NEGATIVE NEGATIVE Final    Comment:        The GeneXpert MRSA Assay (FDA approved for NASAL specimens only), is one component of a comprehensive MRSA colonization surveillance program. It is not intended to diagnose MRSA infection nor to guide or monitor treatment for MRSA infections.   Surgical pcr screen     Status: None   Collection Time: 01/23/16  4:43 PM  Result Value Ref Range Status   MRSA, PCR NEGATIVE NEGATIVE Final   Staphylococcus aureus NEGATIVE NEGATIVE Final    Comment:        The Xpert SA Assay (FDA approved for NASAL specimens in patients over 76 years of age), is one component of a comprehensive surveillance program.  Test performance has been validated by [facility] for patients greater than or equal to 40 year old. It is not intended to diagnose infection nor to guide or monitor treatment.     Coagulation Studies: No results for input(s):  LABPROT, INR in the last 72 hours.  Imaging: Pelvis Portable  Result Date: 01/21/2016 CLINICAL DATA:  Postop film for right hip arthroplasty. EXAM: PORTABLE PELVIS 1-2 VIEWS COMPARISON:  None. FINDINGS: Right hip arthroplasty hardware appears appropriately positioned. Expected postsurgical changes within the overlying soft tissues. Adjacent osseous pelvis appears intact and normally aligned. IMPRESSION: Expected postsurgical changes status post right hip arthroplasty. Hardware appears appropriately positioned. No evidence of surgical complicating feature. Electronically Signed   By: Bary Richard M.D.   On: 02/03/2016 19:07   Dg Chest Port 1 View  Result Date: 01/23/2016 CLINICAL DATA:  Pulmonary edema EXAM: PORTABLE CHEST 1 VIEW COMPARISON:  01/12/2016 FINDINGS: Cardiac shadow is stable but mildly enlarged. Aortic calcifications are again seen. Vascular congestion and right-sided pleural effusion are noted. Stable right basilar infiltrate is seen. No bony abnormality is noted. IMPRESSION: Increasing vascular congestion when compare with the prior exam. Stable changes in the right base consistent  with effusion and infiltrate. Electronically Signed   By: Alcide Clever M.D.   On: 01/23/2016 17:14   Dg C-arm 1-60 Min  Result Date: 01/04/2016 CLINICAL DATA:  Right anterior hip replacement. EXAM: OPERATIVE RIGHT HIP (WITH PELVIS IF PERFORMED)  VIEWS TECHNIQUE: Fluoroscopic spot image(s) were submitted for interpretation post-operatively. COMPARISON:  None. FINDINGS: Two fluoroscopic spot images are provided showing placement of right hip arthroplasty hardware. Hardware appears appropriately positioned. No evidence of surgical complicating feature appreciated. 18 seconds of fluoroscopy provided for the exam. IMPRESSION: Intraoperative fluoroscopic spot images showing placement of right hip arthroplasty hardware. 18 seconds of fluoroscopy provided. Electronically Signed   By: Bary Richard M.D.   On: 01/08/2016  18:38   Dg Hip Operative Unilat W Or W/o Pelvis Right  Result Date: 01/10/2016 CLINICAL DATA:  Right anterior hip replacement. EXAM: OPERATIVE RIGHT HIP (WITH PELVIS IF PERFORMED)  VIEWS TECHNIQUE: Fluoroscopic spot image(s) were submitted for interpretation post-operatively. COMPARISON:  None. FINDINGS: Two fluoroscopic spot images are provided showing placement of right hip arthroplasty hardware. Hardware appears appropriately positioned. No evidence of surgical complicating feature appreciated. 18 seconds of fluoroscopy provided for the exam. IMPRESSION: Intraoperative fluoroscopic spot images showing placement of right hip arthroplasty hardware. 18 seconds of fluoroscopy provided. Electronically Signed   By: Bary Richard M.D.   On: 01/17/2016 18:38   Ct Head Code Stroke Wo Contrast`  Result Date: February 08, 2016 CLINICAL DATA:  Code stroke. Right-sided weakness and right facial droop. Hip replacement yesterday EXAM: CT HEAD WITHOUT CONTRAST TECHNIQUE: Contiguous axial images were obtained from the base of the skull through the vertex without intravenous contrast. COMPARISON:  CT head 02/01/2016 FINDINGS: Generalized atrophy. Benign cyst right basal ganglia unchanged. Negative for acute infarct. Negative for intracranial hemorrhage. No mass or edema. No change from the recent CT Atherosclerotic calcification in the carotid and vertebral arteries bilaterally. No acute abnormality in the skull. Small air-fluid level sphenoid sinus. Bilateral lens replacement. ASPECTS [facility] Stroke Program Early CT Score) - Ganglionic level infarction (caudate, lentiform nuclei, internal capsule, insula, M1-M3 cortex): 7 - Supraganglionic infarction (M4-M6 cortex): 3 Total score (0-10 with 10 being normal): 10 IMPRESSION: 1. Negative for acute infarct. 2. ASPECTS is 10 These results were called by telephone at the time of interpretation on 2016/02/08 at 10:37 am to Dr. Hilda Blades, who verbally acknowledged these results.  Electronically Signed   By: Marlan Palau M.D.   On: 08-Feb-2016 10:37    Assessment and plan discussed with with attending physician and they are in agreement.    Jacqueline Morn PA-C [facility] [phone]  Feb 08, 2016, 11:15 AM   Assessment: 82 y.o. female status post right hip hemiarthroplasty now presenting with left MCA stroke noted by both exam and CTA of head. Patient will be going to interventional radiology for possible intervention. Postprocedure patient will be brought to neuro ICU.  Stroke Risk Factors - hyperlipidemia and hypertension atrial fibrillation   Neurology attending:  Onalee [name] and I have both evaluated Jacqueline [name] this morning. She was found to have aphasia and right hemiplegia around 9:30 this morning. Her last known well was around 8:30 AM. She was not a candidate for TPA due to the fact that she had recent surgery. She was found to have an emergent occlusion at the takeoff of the left M1 segment.  The interventional radiology team was consulted. Given her aspects score was 10 and modified Rankin was 1 in the setting of significant neurological deficits, it was felt she was a candidate for interventional  radiology.  The patient was transported to the interventional radiology suite for further evaluation and care.   Jacqueline [name], M.D. Neurohospitalist

## 2016-01-25 NOTE — Transfer of Care (Signed)
Immediate Anesthesia Transfer of Care Note  Patient: Jacqueline [name]  Procedure(s) Performed: Procedure(s): RADIOLOGY WITH ANESTHESIA (N/A)  Patient Location: Nursing Unit  Anesthesia Type:General  Level of Consciousness: Patient remains intubated per anesthesia plan  Airway & Oxygen Therapy: Patient remains intubated per anesthesia plan  Post-op Assessment: Post -op Vital signs reviewed and stable  Post vital signs: stable  Last Vitals:  Vitals:   01/21/2016 1100 01/20/2016 1115  BP:  (!) 159/72  Pulse: 79 80  Resp: (!) 25 (!) 24  Temp:      Last Pain:  Vitals:   01/05/2016 0814  TempSrc: Axillary  PainSc:       Patients Stated Pain Goal: 2 (01/27/2016 0926)  Complications: No apparent anesthesia complications

## 2016-01-25 NOTE — Procedures (Signed)
S/P Lt Common carotid arteriogram,follwed by complete revascularization of occluded LT MCA M1 with x 1 pass with solitaire FR 4mm x40 mm retrieval device achieving a TICI 3 reperfusion. Also used 5.4 mg of IA integrelin superselectively

## 2016-01-25 NOTE — Progress Notes (Signed)
Decreased FIO2 per MD

## 2016-01-25 NOTE — Progress Notes (Signed)
OT Cancellation Note  Patient Details Name: Norval GableMary E Lilja MRN: [id number] DOB: 08/23/1916   Cancelled Treatment:    Reason Eval/Treat Not Completed: Medical issues which prohibited therapy. Pt currently in a code stroke situation. Will eval at a later time as appropriate.  Evette GeorgesLeonard, Zinedine Ellner Eva [phone] 01/28/2016, 10:38 AM

## 2016-01-25 NOTE — Progress Notes (Addendum)
Patient very lethargic compared to 1 hour ago. Opens eyes to voice but none verbal. Vitals done. See chart. MD made aware. Will give Narcan per order.

## 2016-01-25 NOTE — Consult Note (Signed)
PULMONARY / CRITICAL CARE MEDICINE   Name: Jacqueline [name] MRN: [id number] DOB: 06/12/1916    ADMISSION DATE:  01/28/2016 CONSULTATION DATE:  2016-02-13  REFERRING MD:  Dr. Corliss Skains  CHIEF COMPLAINT: Left M-1 segment occlussion  HISTORY OF PRESENT ILLNESS:  82 year old female with PMH of DM, chronic AF not on anticoagulation secondary to chronic falls (off eliquis 6 months ago), CAD, AS, Diastolic HF, HTN, HLD, hypothyroidism admitted 8/19 after a mechanical fall..  She is a resident at assisted living and is fairly active.  Unfortunately she suffered a mechanical fall on 8/19 and fractured her right femoral neck.  Pre-op she was seen by Cardiology for clearance and given high risk given PMH and age.  Surgery was postponed secondary to development of pulmonary edema and hypoxia on the 8/20 which improved with lasix and oxygen.  The patient underwent a right hemi-arthroplasty on the evening of 8/21 and was extubated without complications. She was placed on lovenox for DVT coverage.  On the morning of 8/22 she was LKW around 0830, then later found with left gaze, right hemiplegia, right facial droop, right hemianopia, and NIHSS of 28.  CTA showed occlusion of left M-1 segment of MCA.  She was intubated and underwent cerebral angiogram with complete revascularization.  The patient remained intubated post-procedure and transferred to Neuro ICU.    PCCM consulted for further ventilator and ICU management.    PAST MEDICAL HISTORY :  She  has a past medical history of Bilateral leg weakness; CAD (coronary artery disease); Diabetes mellitus; Diastolic dysfunction; Gout; Hyperlipidemia; Hypertension; Hypothyroidism; Mild aortic stenosis; Permanent atrial fibrillation (HCC); and Shortness of breath.  PAST SURGICAL HISTORY: She  has a past surgical history that includes Left knee replacement; Breast biopsies; Cataract extraction; Coronary angioplasty with stent (march 2014); left heart catheterization with  coronary angiogram (N/A, 08/02/2012); percutaneous coronary rotoblator intervention (pci-r) (N/A, 08/05/2012); and Total hip arthroplasty (Right, 02/01/2016).  Allergies  Allergen Reactions  . Aspirin     Pt states she cannot have aspirin due to Eliquis  . Amlodipine Swelling    No current facility-administered medications on file prior to encounter.    Current Outpatient Prescriptions on File Prior to Encounter  Medication Sig  . acetaminophen (TYLENOL) 325 MG tablet Take 650 mg by mouth every 6 (six) hours as needed for pain.  Marland Kitchen aspirin EC 81 MG tablet Take 1 tablet (81 mg total) by mouth daily.  . furosemide (LASIX) 40 MG tablet Take 1 tablet (40 mg total) by mouth daily.  Marland Kitchen levothyroxine (SYNTHROID, LEVOTHROID) 112 MCG tablet Take 112 mcg by mouth daily before breakfast.  . olmesartan (BENICAR) 20 MG tablet Take 20 mg by mouth daily.  . potassium chloride SA (K-DUR,KLOR-CON) 20 MEQ tablet Take 20 mEq by mouth daily.   . carvedilol (COREG) 25 MG tablet TAKE 1 TABLET TWICE A DAY WITH MEALS (Patient not taking: Reported on 01/23/2016)  . nitroGLYCERIN (NITROSTAT) 0.4 MG SL tablet Place 1 tablet (0.4 mg total) under the tongue every 5 (five) minutes as needed for chest pain. (Patient not taking: Reported on 01/23/2016)    FAMILY HISTORY:  Her indicated that her mother is deceased. She indicated that her father is deceased. She indicated that her brother is deceased.    SOCIAL HISTORY: She  reports that she has never smoked. She has never used smokeless tobacco. She reports that she does not drink alcohol or use drugs.  REVIEW OF SYSTEMS:  Unable to be assess as patient  is intubated.    SUBJECTIVE:  On cardene gtt at 5mg /hr.  Right sheath to remain in overnight.  Propofol stopped at 1528 for neuro assessment   VITAL SIGNS: BP 100/60 (BP Location: Left Arm)   Pulse 82   Temp 98.2 F (36.8 C) (Axillary)   Resp (!) 24   Ht 5\' 4"  (1.626 m)   Wt 177 lb 7.5 oz (80.5 kg)   LMP  (LMP  Unknown)   SpO2 100%   BMI 30.46 kg/m   HEMODYNAMICS:    VENTILATOR SETTINGS: Vent Mode: PRVC FiO2 (%):  [60 %] 60 % Set Rate:  [14 bmp] 14 bmp Vt Set:  [440 mL] 440 mL PEEP:  [5 cmH20] 5 cmH20  INTAKE / OUTPUT: I/O last 3 completed shifts: In: 920 [P.O.:720; IV Piggyback:200] Out: 1490 [Urine:1415; Blood:75]  PHYSICAL EXAMINATION: General:  Elderly female on mechanical ventilation in no acute distress Neuro: Eyes open, attempts to move LUE to command HEENT:  ETT in place, no appreciable JVD, Pupils 32mm/equal and reactive Cardiovascular: IRRR, 2+pulses, ? Murmur 2nd ICS RSB Lungs:  Rales left base, otherwise clear.  Non-labored breathing on MV Abdomen:  Soft, hypoactive BS Musculoskeletal:  No acute deformities.  Dressing to right anterior hip CDI Skin:  Warm and dry, no rashes or bruises.  Right groin sheath c/d/i without evidence of hematoma, 1+ BLE edema   LABS:  BMET  Recent Labs Lab 01/23/16 0553 01/16/2016 0303 01/22/2016 1125  NA 140 138 135  K 3.9 3.5 3.7  CL 102 105 103  CO2 25 25 26   BUN 20 27* 37*  CREATININE 1.13* 1.29* 1.20*  GLUCOSE 162* 144* 112*    Electrolytes  Recent Labs Lab 01/23/16 0553 01/10/2016 0303 01/24/2016 0710 01/16/2016 1125  CALCIUM 9.2 8.9  --  8.4*  MG 2.1  --  2.1  --   PHOS 4.2  --   --   --     CBC  Recent Labs Lab 01/23/16 0553 01/23/2016 0303 01/08/2016 0832  WBC 11.4* 15.0* 8.6  HGB 14.3 14.1 12.6  HCT 44.5 43.8 39.7  PLT 146* 115* 93*    Coag's  Recent Labs Lab 01/29/2016 1125  INR 1.34    Sepsis Markers No results for input(s): LATICACIDVEN, PROCALCITON, O2SATVEN in the last 168 hours.  ABG No results for input(s): PHART, PCO2ART, PO2ART in the last 168 hours.  Liver Enzymes  Recent Labs Lab 01/23/16 0553 01/11/2016 0303  AST 25 21  ALT 18 16  ALKPHOS 104 79  BILITOT 1.2 1.2  ALBUMIN 3.4* 3.1*    Cardiac Enzymes  Recent Labs Lab 12-Feb-2016 2301 01/23/16 0553  TROPONINI 0.03* 0.07*     Glucose  Recent Labs Lab 01/27/2016 1612 01/28/2016 1938 01/29/2016 0954  GLUCAP 104* 112* 112*    Imaging Ct Angio Head W Or Wo Contrast  Result Date: 01/31/2016 CLINICAL DATA:  82 year old female code stroke with right side weakness. Initial encounter. EXAM: CT ANGIOGRAPHY HEAD AND NECK TECHNIQUE: Multidetector CT imaging of the head and neck was performed using the standard protocol during bolus administration of intravenous contrast. Multiplanar CT image reconstructions and MIPs were obtained to evaluate the vascular anatomy. Carotid stenosis measurements (when applicable) are obtained utilizing NASCET criteria, using the distal internal carotid diameter as the denominator. CONTRAST:  50 mL Isovue 370 COMPARISON:  Head CT without contrast 1025 hours today. FINDINGS: CTA NECK Skeleton: Degenerative changes in the spine. No acute osseous abnormality identified. Visualized paranasal sinuses and mastoids are stable  and well pneumatized. Other neck: Moderate to large layering right pleural effusion. Smaller but at least moderate layering left pleural effusion. Fluid may be tracking in the superior aspect of the left major fissure. Moderate to large multinodular thyroid goiter. No superior mediastinal lymphadenopathy. Negative larynx, pharynx, parapharyngeal spaces, retropharyngeal space, sublingual space, submandibular glands and parotid glands. Asymmetric and rounded 8 mm right level 1 B lymph node. Series 6, image 101). Elsewhere cervical lymph nodes appear normal. Aortic arch: 3 vessel arch configuration. Moderate soft and calcified arch atherosclerosis. Right carotid system: No brachiocephalic artery stenosis. Tortuous right CCA origin with a kinked appearance, but otherwise no stenosis. Mild for age right carotid atherosclerosis in the neck without stenosis. Mildly tortuous distal cervical right ICA. Left carotid system: No left CCA origin stenosis. Tortuous left CCA. Confluent calcified plaque at  the left carotid bifurcation involving the left CCA origin and bulb, but stenosis is less than 50 % with respect to the distal vessel. Tortuous cervical left CCA at the level of the pharynx. Vertebral arteries:Tortuous and atherosclerotic right subclavian artery origin with stenosis up to 75 % with respect to the distal vessel. Normal right vertebral artery origin. Negative right vertebral artery to the skullbase. No proximal left subclavian artery stenosis despite calcified plaque. Normal left vertebral artery origin. The left vertebral is mildly dominant. There is minimal calcified plaque but no stenosis along its course to the neck. CTA HEAD Posterior circulation: Dominant distal left vertebral artery with V4 segment calcified plaque resulting in mild stenosis. The non dominant right vertebral artery is more moderately irregular and stenotic in the V4 segment, but remains patent to the vertebrobasilar junction. Mild basilar artery irregularity and stenosis. SCA and right PCA origins remain patent. Diminutive right posterior communicating artery. The left PCA origin is severely stenotic. There is poor flow in the left PCA branches. Anterior circulation: Both ICA siphons remain patent. There is moderate calcified plaque in both ICA siphons, with severe stenosis at the distal right cavernous segment, and moderate stenosis in the right supraclinoid segment. On the left stenosis is less than 50%. Both carotid termini are patent, but the right ACA A1 segment is non dominant and diminutive. Normal left ACA A1, anterior communicating artery. Moderate to severe left ACA A2 segment stenosis proximal to the left pericallosal artery. Bilateral ACA branches however remain patent. Right MCA M1 segment, bifurcation, and right MCA branches are within normal limits. There is left MCA occlusion in the mid M1 segment there is an intermediate level of collateral flow in the left MCA branches (series 11, image 18) Venous sinuses:  Patent. Anatomic variants: Dominant left vertebral artery and right A1. Delayed phase: There remains no acute cortically based infarct evident in the left hemisphere. Stable gray-white matter differentiation throughout the brain. No acute intracranial hemorrhage identified. No midline shift, mass effect, or evidence of intracranial mass lesion. IMPRESSION: 1. Positive for emergent large vessel occlusion at the left M1 segment. This was discussed with Dr. Hilda BladesArmstrong at 1054 hours. Intermediate level collateral enhancement in the left MCA branches. 2. Stable CT appearance of the brain from the initial scan this morning. No acute left MCA ischemia evident by CT. 3. Superimposed severe atherosclerotic stenoses of the right ICA siphon (anterior genu), left ACA A2 segment, and left PCA (with poor left PCA branch enhancement). 4. Up to moderate atherosclerotic stenosis of the non dominant distal right vertebral artery, and basilar artery. 5. High-grade stenosis of the right subclavian artery origin (75%). 6. Large right and  moderate to large left layering pleural effusions. Electronically Signed   By: Odessa FlemingH  Hall M.D.   On: 01/08/2016 11:47   Ct Head Wo Contrast  Result Date: 01/14/2016 CLINICAL DATA:  Follow-up code stroke. Status post revascularization of occluded LEFT M1 segment. EXAM: CT HEAD WITHOUT CONTRAST TECHNIQUE: Contiguous axial images were obtained from the base of the skull through the vertex without intravenous contrast. COMPARISON:  CT HEAD January 25, 2016 at 10:25 a.m. FINDINGS: BRAIN: The ventricles and sulci are normal for age. No intraparenchymal hemorrhage, mass effect nor midline shift. Patchy supratentorial white matter hypodensities less than expected for patient's age, though non-specific are most compatible with chronic small vessel ischemic disease. Slight blurring of the posterior insula gray-white matter differentiation. No abnormal extra-axial fluid collections. Basal cisterns are patent.  VASCULAR: Moderate calcific atherosclerosis of the carotid siphons. Mild residual contrast opacification of the intracranial vessels. SKULL: No skull fracture. No significant scalp soft tissue swelling. Moderate temporomandibular osteoarthrosis. SINUSES/ORBITS: The included ocular globes and orbital contents are non-suspicious. Status post bilateral ocular lens implants. The mastoid air-cells and included paranasal sinuses are well-aerated. OTHER: None. IMPRESSION: Suspected small LEFT MCA territory nonhemorrhagic infarct. Stable involutional changes and mild chronic small vessel ischemic disease. Electronically Signed   By: Awilda Metroourtnay  Bloomer M.D.   On: 01/07/2016 13:41   Ct Angio Neck W Or Wo Contrast  Result Date: 01/10/2016 CLINICAL DATA:  82 year old female code stroke with right side weakness. Initial encounter. EXAM: CT ANGIOGRAPHY HEAD AND NECK TECHNIQUE: Multidetector CT imaging of the head and neck was performed using the standard protocol during bolus administration of intravenous contrast. Multiplanar CT image reconstructions and MIPs were obtained to evaluate the vascular anatomy. Carotid stenosis measurements (when applicable) are obtained utilizing NASCET criteria, using the distal internal carotid diameter as the denominator. CONTRAST:  50 mL Isovue 370 COMPARISON:  Head CT without contrast 1025 hours today. FINDINGS: CTA NECK Skeleton: Degenerative changes in the spine. No acute osseous abnormality identified. Visualized paranasal sinuses and mastoids are stable and well pneumatized. Other neck: Moderate to large layering right pleural effusion. Smaller but at least moderate layering left pleural effusion. Fluid may be tracking in the superior aspect of the left major fissure. Moderate to large multinodular thyroid goiter. No superior mediastinal lymphadenopathy. Negative larynx, pharynx, parapharyngeal spaces, retropharyngeal space, sublingual space, submandibular glands and parotid glands.  Asymmetric and rounded 8 mm right level 1 B lymph node. Series 6, image 101). Elsewhere cervical lymph nodes appear normal. Aortic arch: 3 vessel arch configuration. Moderate soft and calcified arch atherosclerosis. Right carotid system: No brachiocephalic artery stenosis. Tortuous right CCA origin with a kinked appearance, but otherwise no stenosis. Mild for age right carotid atherosclerosis in the neck without stenosis. Mildly tortuous distal cervical right ICA. Left carotid system: No left CCA origin stenosis. Tortuous left CCA. Confluent calcified plaque at the left carotid bifurcation involving the left CCA origin and bulb, but stenosis is less than 50 % with respect to the distal vessel. Tortuous cervical left CCA at the level of the pharynx. Vertebral arteries:Tortuous and atherosclerotic right subclavian artery origin with stenosis up to 75 % with respect to the distal vessel. Normal right vertebral artery origin. Negative right vertebral artery to the skullbase. No proximal left subclavian artery stenosis despite calcified plaque. Normal left vertebral artery origin. The left vertebral is mildly dominant. There is minimal calcified plaque but no stenosis along its course to the neck. CTA HEAD Posterior circulation: Dominant distal left vertebral artery with V4  segment calcified plaque resulting in mild stenosis. The non dominant right vertebral artery is more moderately irregular and stenotic in the V4 segment, but remains patent to the vertebrobasilar junction. Mild basilar artery irregularity and stenosis. SCA and right PCA origins remain patent. Diminutive right posterior communicating artery. The left PCA origin is severely stenotic. There is poor flow in the left PCA branches. Anterior circulation: Both ICA siphons remain patent. There is moderate calcified plaque in both ICA siphons, with severe stenosis at the distal right cavernous segment, and moderate stenosis in the right supraclinoid segment. On  the left stenosis is less than 50%. Both carotid termini are patent, but the right ACA A1 segment is non dominant and diminutive. Normal left ACA A1, anterior communicating artery. Moderate to severe left ACA A2 segment stenosis proximal to the left pericallosal artery. Bilateral ACA branches however remain patent. Right MCA M1 segment, bifurcation, and right MCA branches are within normal limits. There is left MCA occlusion in the mid M1 segment there is an intermediate level of collateral flow in the left MCA branches (series 11, image 18) Venous sinuses: Patent. Anatomic variants: Dominant left vertebral artery and right A1. Delayed phase: There remains no acute cortically based infarct evident in the left hemisphere. Stable gray-white matter differentiation throughout the brain. No acute intracranial hemorrhage identified. No midline shift, mass effect, or evidence of intracranial mass lesion. IMPRESSION: 1. Positive for emergent large vessel occlusion at the left M1 segment. This was discussed with Dr. Hilda Blades at 1054 hours. Intermediate level collateral enhancement in the left MCA branches. 2. Stable CT appearance of the brain from the initial scan this morning. No acute left MCA ischemia evident by CT. 3. Superimposed severe atherosclerotic stenoses of the right ICA siphon (anterior genu), left ACA A2 segment, and left PCA (with poor left PCA branch enhancement). 4. Up to moderate atherosclerotic stenosis of the non dominant distal right vertebral artery, and basilar artery. 5. High-grade stenosis of the right subclavian artery origin (75%). 6. Large right and moderate to large left layering pleural effusions. Electronically Signed   By: Odessa Fleming M.D.   On: 02/01/2016 11:47   Pelvis Portable  Result Date: 01/15/2016 CLINICAL DATA:  Postop film for right hip arthroplasty. EXAM: PORTABLE PELVIS 1-2 VIEWS COMPARISON:  None. FINDINGS: Right hip arthroplasty hardware appears appropriately positioned. Expected  postsurgical changes within the overlying soft tissues. Adjacent osseous pelvis appears intact and normally aligned. IMPRESSION: Expected postsurgical changes status post right hip arthroplasty. Hardware appears appropriately positioned. No evidence of surgical complicating feature. Electronically Signed   By: Bary Richard M.D.   On: 01/19/2016 19:07   Dg C-arm 1-60 Min  Result Date: 01/10/2016 CLINICAL DATA:  Right anterior hip replacement. EXAM: OPERATIVE RIGHT HIP (WITH PELVIS IF PERFORMED)  VIEWS TECHNIQUE: Fluoroscopic spot image(s) were submitted for interpretation post-operatively. COMPARISON:  None. FINDINGS: Two fluoroscopic spot images are provided showing placement of right hip arthroplasty hardware. Hardware appears appropriately positioned. No evidence of surgical complicating feature appreciated. 18 seconds of fluoroscopy provided for the exam. IMPRESSION: Intraoperative fluoroscopic spot images showing placement of right hip arthroplasty hardware. 18 seconds of fluoroscopy provided. Electronically Signed   By: Bary Richard M.D.   On: 01/25/2016 18:38   Dg Hip Operative Unilat W Or W/o Pelvis Right  Result Date: 01/12/2016 CLINICAL DATA:  Right anterior hip replacement. EXAM: OPERATIVE RIGHT HIP (WITH PELVIS IF PERFORMED)  VIEWS TECHNIQUE: Fluoroscopic spot image(s) were submitted for interpretation post-operatively. COMPARISON:  None. FINDINGS: Two fluoroscopic  spot images are provided showing placement of right hip arthroplasty hardware. Hardware appears appropriately positioned. No evidence of surgical complicating feature appreciated. 18 seconds of fluoroscopy provided for the exam. IMPRESSION: Intraoperative fluoroscopic spot images showing placement of right hip arthroplasty hardware. 18 seconds of fluoroscopy provided. Electronically Signed   By: Bary Richard M.D.   On: 01/07/2016 18:38   Ct Head Code Stroke Wo Contrast`  Result Date: 01/21/2016 CLINICAL DATA:  Code stroke.  Right-sided weakness and right facial droop. Hip replacement yesterday EXAM: CT HEAD WITHOUT CONTRAST TECHNIQUE: Contiguous axial images were obtained from the base of the skull through the vertex without intravenous contrast. COMPARISON:  CT head 01/28/2016 FINDINGS: Generalized atrophy. Benign cyst right basal ganglia unchanged. Negative for acute infarct. Negative for intracranial hemorrhage. No mass or edema. No change from the recent CT Atherosclerotic calcification in the carotid and vertebral arteries bilaterally. No acute abnormality in the skull. Small air-fluid level sphenoid sinus. Bilateral lens replacement. ASPECTS [facility] Stroke Program Early CT Score) - Ganglionic level infarction (caudate, lentiform nuclei, internal capsule, insula, M1-M3 cortex): 7 - Supraganglionic infarction (M4-M6 cortex): 3 Total score (0-10 with 10 being normal): 10 IMPRESSION: 1. Negative for acute infarct. 2. ASPECTS is 10 These results were called by telephone at the time of interpretation on 01/24/2016 at 10:37 am to Dr. Hilda Blades, who verbally acknowledged these results. Electronically Signed   By: Marlan Palau M.D.   On: 02/03/2016 10:37     STUDIES:  8/19 CT cervical spin >> no acute findings; mild degenerative changes 8/19 CT head >> atrophy and chronic small vessel ischemic change; no acute/traumatic finding 8/21 TTE >> mod LVH; severe focal basal hypertrophy of the septum; LVEF 60-65%; normal wall motion, no regional wall motion abnormalities; mild AS with trivial AR; MV with calcified annulus and mildly thickened leaflets, LA mod dilated, RA severely dilated; mod TR, PAS 53 mmHg 8/22 CTA head/neck >> large occlusion of left M1 segment,  Severe atherosclerotic stenosis of right ICA siphon, left ACA A2 segment, and left PCA with poor branch enhancement; moderate atherosclerotic stenosis of the non dominant distal right vertebral artery and basilar artery; high grade stenosis of RCA origin; large right and mod  to large left layering pleural efusions 8/22 CT head (post intervention) >> suspected small left MCA territory nonhemorrhagic infarct; stable involutional changes and mild chronic small vessel ischemic disease 8/23 MRI >>   CULTURES: 8/20 MRSA PCR >> negative  ANTIBIOTICS: Ancef 8/21 >>  SIGNIFICANT EVENTS: 8/19  Admit after fall with R fem neck fx 8/20  Pulmonary edema, hypoxia that improved with lasix / O2 8/21  OR for R anterior ORIF 8/22  L M1 CVA, to neuro IR for revascularization   LINES/TUBES: ETT 8/22 >>  R fem sheath 8/22 >>  L Rad Aline 8/22 >>   DISCUSSION:  46 yoF with PMH of Afib (not on AC 2/2 to frequent falls), DM, Diastolic HF, AS, and HTN  admitted 8/19 after mechanical fall sustaining R neck femoral neck fracture.  Developed pulmonary edema 8/20 and hypoxia thought secondary to IVF improved after lasix which delayed surgery. Taken to the OR on 8/21 for right hip hemiarthroplasty.  On 8/22 found with right hemiplegia, hemianopia, facial droop, and left gaze with NIHSS 28.  Taken to neuro IR for revascularization.  To ICU post procedure.   ASSESSMENT / PLAN:  PULMONARY A: Acute Hypoxic Respiratory Failure - in setting of bilateral pleural effusions, poor airway protection, L M-1 infarct and AMS  P:   PRVC 8 cc/kg  Wean PEEP / FiO2 for sats > 92% Daily SBT / WUA  CXR to evaluate ETT placement  ABG post arrival to ICU   CARDIOVASCULAR A:  PAF - not on anticoagulation due to increased fall risk  HTN AS HLD Diastolic Dysfunction  CAD P:  ICU monitoring of hemodynamics  SBP goal 100-140 per Neuro IR  Cardene gtt for above goals  Continue coreg, irbesartan  PRN lopressor  RENAL A:   At Risk AKI - baseline sr cr ~ 1.2 P:   Trend BMP / UOP  Replace electrolytes as indicated    GASTROINTESTINAL A:   R/O Dysphagia - post CVA Constipation  P:   NPO  Consider TF in am if not extubated  Pepcid for stress ulcer prophylaxis  Bowel regimen >>  senokot, dulcolax  HEMATOLOGIC A:   Thrombocytopenia  P:  Trend CBC  SCD's for DVT prophylaxis  Continue lovenox for now >> will need to be cautious with sheath removal and post CVA.  Defer to Neuro.  INFECTIOUS A:   Post R Hip Arthroplasty  P:   Monitor surgical site  Trend fever / WBC curve  ENDOCRINE A:   DM   Hypothyroidism  P:   SSI Q4 Continue synthroid per tube   NEUROLOGIC / ORTHO  A:   L M-1 Post R Hip Arthroplasty P:   RASS goal: -1 Propofol for sedation PRN fentanyl for pain  Further Neuro imaging per Neurology  PT when able  Likely will need SNF post discharge  FAMILY  - Updates: Daughter updated at bedside.    - Inter-disciplinary family meet or Palliative Care meeting due by:  8/29  [facility] Brim, NP-C [facility] Pgr: [phone] or if no answer [phone] 08-Feb-2016, 4:32 PM  Attending Note:  82 year old female with history of multiple falls with hip fracture.  Hip was addressed but subsequently patient developed a L MCA CVA and was taken acutely to the cath lab and addressed via IR.  Patient was intubated for the procedure and PCCM was consulted for vent management.  On exam, patient is arousable and is following commands.  CXR pending.  I reviewed head CT myself, atrophy noted and acute CVA.  Spoke with family, explained situation.  Will keep sedate overnight.  Calcium channel blocker for BP control as a drip.  Needs some diureses to assist with vent weaning.  Flat today until sheaths are out.  Anticipate extubation in AM.  The patient is critically ill with multiple organ systems failure and requires high complexity decision making for assessment and support, frequent evaluation and titration of therapies, application of advanced monitoring technologies and extensive interpretation of multiple databases.   Critical Care Time devoted to patient care services described in this note is  35  Minutes. This time reflects time of care  of this signee Dr Koren Bound. This critical care time does not reflect procedure time, or teaching time or supervisory time of PA/NP/Med student/Med Resident etc but could involve care discussion time.  Alyson Reedy, M.D. [facility]. Pager: [phone]. After hours pager: [phone].

## 2016-01-25 NOTE — Code Documentation (Signed)
82yo female admitted on 01/17/2016 s/p fall.  Patient with right hip femoral neck fracture and underwent right hip hemiarthroplasty on 01/11/2016.  Patient resides at ALF and handles her own ADLs.  Patient with chronic atrial fibrillation not on anticoagulation d/t high fall risk.  Patient was LKW at 0830 this morning per bedside nurse.  Patient was later assessed to be less responsive and not moving her right arm.  Code stroke activated.  Patient transported to CT via bedside RN and RRT RN.  Stroke team at the bedside on patient arrival to CT.  CT completed.  NIHSS 28, see documentation for details and code stroke times.  Patient nonverbal with right hemiplegia, left gaze, right hemianopia and right facial droop on exam.  20G PIV placed in CT with multiple attempts.  CTA completed showing left M1 occlusion.  Patient's family escorted to CT by bedside RN.  Family updated on plan of care - will proceed to IR.  Patient to IR with RRT RN and Stroke RN.  Patient to IR holding area.  Bedside handoff with IR RN Patty.

## 2016-01-25 NOTE — Discharge Instructions (Signed)
Full weight bearing as tolerated right hi-; only up with assistance. Can leave current hip dressing in place until her outpatient ortho follow-up.

## 2016-01-25 NOTE — Anesthesia Preprocedure Evaluation (Addendum)
Anesthesia Evaluation  Patient identified by MRN, date of birth, ID band Patient confused    Reviewed: Allergy & Precautions, NPO status , Patient's Chart, lab work & pertinent test results, Unable to perform ROS - Chart review onlyPreop documentation limited or incomplete due to emergent nature of procedure.  History of Anesthesia Complications Negative for: history of anesthetic complications  Airway   TM Distance: >3 FB Neck ROM: Limited   Comment: Uncooperative for exam Dental   Pulmonary neg pulmonary ROS,    breath sounds clear to auscultation       Cardiovascular hypertension, Pt. on medications and Pt. on home beta blockers + CAD and + Cardiac Stents  + dysrhythmias Atrial Fibrillation  Rhythm:Irregular Rate:Normal  01/11/2016 ECHO: EF 60-65%,  Mild AS, mod TR   Neuro/Psych CVA (acute CVA: R hemiparesis, unable to speak), Residual Symptoms    GI/Hepatic negative GI ROS, Neg liver ROS,   Endo/Other  diabetes (glu 112)Hypothyroidism   Renal/GU Renal InsufficiencyRenal disease (creat 1.29)     Musculoskeletal  (+) Arthritis , Osteoarthritis,    Abdominal   Peds  Hematology  (+) Blood dyscrasia (plt 93K), ,   Anesthesia Other Findings   Reproductive/Obstetrics                            Anesthesia Physical Anesthesia Plan  ASA: IV and emergent  Anesthesia Plan: General   Post-op Pain Management:    Induction: Intravenous  Airway Management Planned: Oral ETT  Additional Equipment: Arterial line  Intra-op Plan:   Post-operative Plan: Post-operative intubation/ventilation  Informed Consent:   Only emergency history available  Plan Discussed with:   Anesthesia Plan Comments: (I personally performed the preop evaluation and exam.  Sandford Craze Jackie Littlejohn, MD)       Anesthesia Quick Evaluation

## 2016-01-25 NOTE — Progress Notes (Signed)
PT Cancellation Note  Patient Details Name: Norval GableMary E Goracke MRN: [id number] DOB: 12/30/1916   Cancelled Treatment:    Reason Eval/Treat Not Completed: Medical issues which prohibited therapy. Noted recent events with lethargy and ?neuro event. Will proceed with PT evaluation when medically appropriate.   Jakara Blatter 2015/11/17, 10:43 AM  Pager [phone]

## 2016-01-25 NOTE — Progress Notes (Signed)
Report called to GrenadaBrittany RN 3MW

## 2016-01-25 NOTE — Progress Notes (Signed)
Patient Name: Jacqueline GableMary E [name] Date of Encounter: 01/06/2016  Active Problems:   Essential hypertension   CAD (coronary artery disease)   Chronic atrial fibrillation (HCC)   Pleural effusion   Hypoxia   Acute on chronic diastolic (congestive) heart failure (HCC)   Moderate pulmonary hypertension (HCC)   Closed displaced fracture of right femoral neck (HCC)   Closed right hip fracture (HCC)   Atrial fibrillation with RVR (HCC)   Length of Stay: 3  SUBJECTIVE  The patient underwent hip surgery yesterday without any complications she is somnolent today but doesn't appear to be in distress.  CURRENT MEDS . carvedilol  12.5 mg Oral BID WC  . enoxaparin (LOVENOX) injection  30 mg Subcutaneous Q24H  . furosemide  40 mg Intravenous Q12H  . irbesartan  150 mg Oral Daily  . levothyroxine  100 mcg Oral QAC breakfast  . senna  1 tablet Oral BID  . sodium chloride flush  3 mL Intravenous Q12H  . sodium chloride flush  3 mL Intravenous Q12H   OBJECTIVE  Vitals:   01/04/2016 0800 01/12/2016 0814 01/08/2016 0900 01/31/2016 0927  BP: (!) 121/96 (!) 115/57 107/82 131/78  Pulse: 78 83 83 77  Resp: 17 19 17  (!) 22  Temp:  98.2 F (36.8 C)    TempSrc:  Axillary    SpO2: 96% 97% 97% 95%  Weight:      Height:        Intake/Output Summary (Last 24 hours) at 01/06/2016 0950 Last data filed at 01/15/2016 [id number]  Gross per 24 hour  Intake              920 ml  Output             1065 ml  Net             -145 ml   Filed Weights   10/28/15 0347 10/28/15 1629 01/04/2016 0413  Weight: 170 lb 10.2 oz (77.4 kg) 170 lb (77.1 kg) 169 lb 12.1 oz (77 kg)    PHYSICAL EXAM  General: Pleasant, in mild distress sec to pain in her hip. Neuro: Alert and oriented X 3. Moves all extremities spontaneously. Psych: Normal affect. HEENT:  Normal  Neck: Supple without bruits or JVD. Lungs:  Resp regular and unlabored, minimal crackles at lung bases.Marland Kitchen. Heart: RRR no s3, s4, or murmurs. Abdomen: Soft, non-tender,  non-distended, BS + x 4.  Extremities: No clubbing, cyanosis or edema. Lateral distortion of her right LLE  Accessory Clinical Findings  CBC  Recent Labs  01/04/2016 2129  10/28/15 0303 01/04/2016 0832  WBC 13.0*  < > 15.0* 8.6  NEUTROABS 11.6*  --   --   --   HGB 14.4  < > 14.1 12.6  HCT 43.4  < > 43.8 39.7  MCV 92.1  < > 94.8 95.0  PLT 137*  < > 115* 93*  < > = values in this interval not displayed. Basic Metabolic Panel  Recent Labs  01/23/16 0553 10/28/15 0303 01/11/2016 0710  NA 140 138  --   K 3.9 3.5  --   CL 102 105  --   CO2 25 25  --   GLUCOSE 162* 144*  --   BUN 20 27*  --   CREATININE 1.13* 1.29*  --   CALCIUM 9.2 8.9  --   MG 2.1  --  2.1  PHOS 4.2  --   --    Liver Function Tests  Recent Labs  01/23/16 0553 01/05/2016 0303  AST 25 21  ALT 18 16  ALKPHOS 104 79  BILITOT 1.2 1.2  PROT 6.1* 5.8*  ALBUMIN 3.4* 3.1*    Recent Labs  08-11-15 2301 01/23/16 0553  TROPONINI 0.03* 0.07*    Recent Labs  01/23/16 0553  TSH 1.340    Radiology/Studies  Dg Chest 2 View  Result Date: 01/15/2016 CLINICAL DATA:  Fall.  Right hip pain.  No reported back pain. EXAM: CHEST  2 VIEW COMPARISON:  04/17/2015 FINDINGS: Bilateral pleural effusions also seen on prior exam. Mild pulmonary venous congestion. No convincing consolidation. No pneumothorax. Chronic cardiomegaly. Mediastinal contours are distorted by rotation. No visible fracture. Exaggerated thoracic kyphosis. Cannot exclude lower thoracic compression deformities, limited by overlapping shadows, but no reported back pain. IMPRESSION: Chronic bilateral pleural effusion, borderline moderate on the right. Pulmonary venous congestion. Electronically Signed   By: Marnee SpringJonathon  Watts M.D.   On: 008/25/2017 21:10   Dg Chest Port 1 View  Result Date: 01/23/2016 CLINICAL DATA:  Pulmonary edema EXAM: PORTABLE CHEST 1 VIEW COMPARISON:  01/12/2016 FINDINGS: Cardiac shadow is stable but mildly enlarged. Aortic  calcifications are again seen. Vascular congestion and right-sided pleural effusion are noted. Stable right basilar infiltrate is seen. No bony abnormality is noted. IMPRESSION: Increasing vascular congestion when compare with the prior exam. Stable changes in the right base consistent with effusion and infiltrate. Electronically Signed   By: Alcide CleverMark  Lukens M.D.   On: 01/23/2016 17:14   Dg Hip Unilat W Or Wo Pelvis 2-3 Views Right  Result Date: 01/22/16 CLINICAL DATA:  82 year old female status post unwitnessed fall, found down. Right hip pain. Initial encounter. EXAM: DG HIP (WITH OR WITHOUT PELVIS) 2-3V RIGHT COMPARISON:  None. FINDINGS: Right femoral neck fracture with varus impaction. The intertrochanteric region appears spared. The right femoral head remains located within the acetabulum. Pelvis appears intact. SI joints within normal limits. Grossly intact proximal left femur. Dystrophic appearing calcifications adjacent to the left greater trochanter. Iliac artery atherosclerosis that iliac artery calcified atherosclerosis. IMPRESSION: Acute right femoral neck fracture with varus impaction. Electronically Signed   By: Odessa FlemingH  Hall M.D.   On: 008/19/17 21:08    TELE: a-fib rate controlled    ASSESSMENT AND PLAN  82 year old female with right hip fracture  1. Acute on chronic diastolic CHF - possibly sec to iv fluids, diuresed 1.3 L and down 5 lbs overnight with improvement of symptoms. I would continue Lasix 40 mg IV twice a day, please monitor weights closely, her creatinine was slightly elevated yesterday, pending today.  2. Permanent atrial fibrillation, rate controlled chads2vasc score is 6 Given frequent falls, she is not a candidate for anticoagulation long term Rate controlled off IV diltiazem Continue home carvediolol and use metoprolol prn for HR sustained > 100 bpm  3. CAD No ischemic symptoms No further workup as above Resume ASA when able Perioperative beta blocker  therapy  4. HTN Controlled  5. Mild AS No further workup or management  We will follow.  Signed, Tobias AlexanderKatarina Tache Bobst MD, [facility] IncFACC 01/09/2016

## 2016-01-25 NOTE — Progress Notes (Addendum)
Noted right upper extremity flaccid. Code Stroke called at 1006hrs. Rapid response Nurse in attendance. Patient taken to CT scan .

## 2016-01-26 ENCOUNTER — Inpatient Hospital Stay (HOSPITAL_COMMUNITY): Payer: Medicare Other

## 2016-01-26 DIAGNOSIS — I63412 Cerebral infarction due to embolism of left middle cerebral artery: Secondary | ICD-10-CM

## 2016-01-26 DIAGNOSIS — S72001A Fracture of unspecified part of neck of right femur, initial encounter for closed fracture: Secondary | ICD-10-CM

## 2016-01-26 LAB — BLOOD GAS, ARTERIAL
Acid-Base Excess: 1.7 mmol/L (ref 0.0–2.0)
Bicarbonate: 26.2 mEq/L — ABNORMAL HIGH (ref 20.0–24.0)
DRAWN BY: 311011
FIO2: 40
LHR: 14 {breaths}/min
O2 SAT: 97.9 %
PCO2 ART: 44.8 mmHg (ref 35.0–45.0)
PEEP/CPAP: 5 cmH2O
PH ART: 7.385 (ref 7.350–7.450)
Patient temperature: 98.6
TCO2: 27.6 mmol/L (ref 0–100)
VT: 440 mL
pO2, Arterial: 111 mmHg — ABNORMAL HIGH (ref 80.0–100.0)

## 2016-01-26 LAB — BASIC METABOLIC PANEL
ANION GAP: 7 (ref 5–15)
BUN: 37 mg/dL — ABNORMAL HIGH (ref 6–20)
CALCIUM: 8.5 mg/dL — AB (ref 8.9–10.3)
CO2: 26 mmol/L (ref 22–32)
Chloride: 105 mmol/L (ref 101–111)
Creatinine, Ser: 1.09 mg/dL — ABNORMAL HIGH (ref 0.44–1.00)
GFR, EST AFRICAN AMERICAN: 48 mL/min — AB (ref >60)
GFR, EST NON AFRICAN AMERICAN: 41 mL/min — AB (ref >60)
GLUCOSE: 102 mg/dL — AB (ref 65–99)
Potassium: 3.7 mmol/L (ref 3.5–5.1)
SODIUM: 138 mmol/L (ref 135–145)

## 2016-01-26 LAB — CBC
HCT: 36.5 % (ref 36.0–46.0)
Hemoglobin: 11.6 g/dL — ABNORMAL LOW (ref 12.0–15.0)
MCH: 29.7 pg (ref 26.0–34.0)
MCHC: 31.8 g/dL (ref 30.0–36.0)
MCV: 93.6 fL (ref 78.0–100.0)
PLATELETS: 113 10*3/uL — AB (ref 150–400)
RBC: 3.9 MIL/uL (ref 3.87–5.11)
RDW: 14.1 % (ref 11.5–15.5)
WBC: 9.7 10*3/uL (ref 4.0–10.5)

## 2016-01-26 LAB — GLUCOSE, CAPILLARY
GLUCOSE-CAPILLARY: 86 mg/dL (ref 65–99)
GLUCOSE-CAPILLARY: 93 mg/dL (ref 65–99)
GLUCOSE-CAPILLARY: 96 mg/dL (ref 65–99)

## 2016-01-26 LAB — MAGNESIUM: MAGNESIUM: 2.2 mg/dL (ref 1.7–2.4)

## 2016-01-26 LAB — LIPID PANEL
Cholesterol: 115 mg/dL (ref 0–200)
HDL: 25 mg/dL — ABNORMAL LOW (ref >40)
LDL CALC: 73 mg/dL (ref 0–99)
TRIGLYCERIDES: 84 mg/dL (ref <150)
Total CHOL/HDL Ratio: 4.6 RATIO
VLDL: 17 mg/dL (ref 0–40)

## 2016-01-26 LAB — PHOSPHORUS: PHOSPHORUS: 3.2 mg/dL (ref 2.5–4.6)

## 2016-01-26 MED ORDER — INSULIN ASPART 100 UNIT/ML ~~LOC~~ SOLN
0.0000 [IU] | SUBCUTANEOUS | Status: DC
  Administered 2016-01-28 (×3): 1 [IU] via SUBCUTANEOUS

## 2016-01-26 MED ORDER — SENNOSIDES 8.8 MG/5ML PO SYRP: 5.0000 mL | ORAL_SOLUTION | Freq: Two times a day (BID) | ORAL | Status: DC

## 2016-01-26 MED ORDER — FUROSEMIDE 10 MG/ML IJ SOLN
40.0000 mg | Freq: Two times a day (BID) | INTRAMUSCULAR | Status: DC
Start: 2016-01-26 — End: 2016-01-27
  Administered 2016-01-26 (×2): 40 mg via INTRAVENOUS
  Filled 2016-01-26 (×2): qty 4

## 2016-01-26 MED ORDER — FUROSEMIDE 10 MG/ML IJ SOLN
40.0000 mg | Freq: Four times a day (QID) | INTRAMUSCULAR | Status: DC
Start: 2016-01-26 — End: 2016-01-26

## 2016-01-26 MED ORDER — DOCUSATE SODIUM 50 MG/5ML PO LIQD: 100.0000 mg | Freq: Every day | ORAL | Status: DC

## 2016-01-26 MED ORDER — PANTOPRAZOLE SODIUM 40 MG PO PACK: 40.0000 mg | PACK | Freq: Every day | ORAL | Status: DC

## 2016-01-26 NOTE — Progress Notes (Signed)
PT Cancellation Note  Patient Details Name: Jacqueline [name] MRN: [id number] DOB: 10/05/1916   Cancelled Treatment:    Reason Eval/Treat Not Completed: Medical issues which prohibited therapy (sheath just removed, bedrest, will hold )   Fabio AsaWerner, Axl Rodino J 01/26/2016, 11:18 AM Charlotte Crumbevon Anastasia Tompson, PT DPT  [phone]

## 2016-01-26 NOTE — Progress Notes (Signed)
STROKE TEAM PROGRESS NOTE   SUBJECTIVE (INTERVAL HISTORY) Her son is at the bedside.  RN is also at bedside. Agitated this morning. Was moving upper extremities spontaneously. Now sedated on propofol. Son says she is hard of hearing. Not following commands.   OBJECTIVE Temp:  [97.7 F (36.5 C)-99.1 F (37.3 C)] 98.2 F (36.8 C) (08/23 1200) Pulse Rate:  [67-101] 79 (08/23 1300) Cardiac Rhythm: Atrial fibrillation (08/23 0800) Resp:  [6-26] 23 (08/23 1300) BP: (78-155)/(38-105) 155/92 (08/23 1300) SpO2:  [95 %-100 %] 100 % (08/23 1300) Arterial Line BP: (74-140)/(39-104) 138/60 (08/23 1300) FiO2 (%):  [40 %] 40 % (08/23 1126) Weight:  [80.1 kg (176 lb 9.4 oz)] 80.1 kg (176 lb 9.4 oz) (08/23 0500)  CBC:  Recent Labs Lab 01/14/2016 2129  01/29/2016 0832 01/26/16 0550  WBC 13.0*  < > 8.6 9.7  NEUTROABS 11.6*  --   --   --   HGB 14.4  < > 12.6 11.6*  HCT 43.4  < > 39.7 36.5  MCV 92.1  < > 95.0 93.6  PLT 137*  < > 93* 113*  < > = values in this interval not displayed.  Basic Metabolic Panel:  Recent Labs Lab 01/23/16 0553  01/31/2016 0710 01/22/2016 1125 01/26/16 0550  NA 140  < >  --  135 138  K 3.9  < >  --  3.7 3.7  CL 102  < >  --  103 105  CO2 25  < >  --  26 26  GLUCOSE 162*  < >  --  112* 102*  BUN 20  < >  --  37* 37*  CREATININE 1.13*  < >  --  1.20* 1.09*  CALCIUM 9.2  < >  --  8.4* 8.5*  MG 2.1  --  2.1  --  2.2  PHOS 4.2  --   --   --  3.2  < > = values in this interval not displayed.  Lipid Panel:    Component Value Date/Time   CHOL 115 01/26/2016 0550   TRIG 84 01/26/2016 0550   HDL 25 (L) 01/26/2016 0550   CHOLHDL 4.6 01/26/2016 0550   VLDL 17 01/26/2016 0550   LDLCALC 73 01/26/2016 0550   HgbA1c:  Lab Results  Component Value Date   HGBA1C 5.6 02/03/2016   Urine Drug Screen: No results found for: LABOPIA, COCAINSCRNUR, LABBENZ, AMPHETMU, THCU, LABBARB    IMAGING  Ct Angio Head W Or Wo Contrast  Result Date: 01/17/2016 CLINICAL DATA:   82 year old female code stroke with right side weakness. Initial encounter. EXAM: CT ANGIOGRAPHY HEAD AND NECK TECHNIQUE: Multidetector CT imaging of the head and neck was performed using the standard protocol during bolus administration of intravenous contrast. Multiplanar CT image reconstructions and MIPs were obtained to evaluate the vascular anatomy. Carotid stenosis measurements (when applicable) are obtained utilizing NASCET criteria, using the distal internal carotid diameter as the denominator. CONTRAST:  50 mL Isovue 370 COMPARISON:  Head CT without contrast 1025 hours today. FINDINGS: CTA NECK Skeleton: Degenerative changes in the spine. No acute osseous abnormality identified. Visualized paranasal sinuses and mastoids are stable and well pneumatized. Other neck: Moderate to large layering right pleural effusion. Smaller but at least moderate layering left pleural effusion. Fluid may be tracking in the superior aspect of the left major fissure. Moderate to large multinodular thyroid goiter. No superior mediastinal lymphadenopathy. Negative larynx, pharynx, parapharyngeal spaces, retropharyngeal space, sublingual space, submandibular glands and parotid glands. Asymmetric  and rounded 8 mm right level 1 B lymph node. Series 6, image 101). Elsewhere cervical lymph nodes appear normal. Aortic arch: 3 vessel arch configuration. Moderate soft and calcified arch atherosclerosis. Right carotid system: No brachiocephalic artery stenosis. Tortuous right CCA origin with a kinked appearance, but otherwise no stenosis. Mild for age right carotid atherosclerosis in the neck without stenosis. Mildly tortuous distal cervical right ICA. Left carotid system: No left CCA origin stenosis. Tortuous left CCA. Confluent calcified plaque at the left carotid bifurcation involving the left CCA origin and bulb, but stenosis is less than 50 % with respect to the distal vessel. Tortuous cervical left CCA at the level of the pharynx.  Vertebral arteries:Tortuous and atherosclerotic right subclavian artery origin with stenosis up to 75 % with respect to the distal vessel. Normal right vertebral artery origin. Negative right vertebral artery to the skullbase. No proximal left subclavian artery stenosis despite calcified plaque. Normal left vertebral artery origin. The left vertebral is mildly dominant. There is minimal calcified plaque but no stenosis along its course to the neck. CTA HEAD Posterior circulation: Dominant distal left vertebral artery with V4 segment calcified plaque resulting in mild stenosis. The non dominant right vertebral artery is more moderately irregular and stenotic in the V4 segment, but remains patent to the vertebrobasilar junction. Mild basilar artery irregularity and stenosis. SCA and right PCA origins remain patent. Diminutive right posterior communicating artery. The left PCA origin is severely stenotic. There is poor flow in the left PCA branches. Anterior circulation: Both ICA siphons remain patent. There is moderate calcified plaque in both ICA siphons, with severe stenosis at the distal right cavernous segment, and moderate stenosis in the right supraclinoid segment. On the left stenosis is less than 50%. Both carotid termini are patent, but the right ACA A1 segment is non dominant and diminutive. Normal left ACA A1, anterior communicating artery. Moderate to severe left ACA A2 segment stenosis proximal to the left pericallosal artery. Bilateral ACA branches however remain patent. Right MCA M1 segment, bifurcation, and right MCA branches are within normal limits. There is left MCA occlusion in the mid M1 segment there is an intermediate level of collateral flow in the left MCA branches (series 11, image 18) Venous sinuses: Patent. Anatomic variants: Dominant left vertebral artery and right A1. Delayed phase: There remains no acute cortically based infarct evident in the left hemisphere. Stable gray-white matter  differentiation throughout the brain. No acute intracranial hemorrhage identified. No midline shift, mass effect, or evidence of intracranial mass lesion. IMPRESSION: 1. Positive for emergent large vessel occlusion at the left M1 segment. This was discussed with Dr. Hilda BladesArmstrong at 1054 hours. Intermediate level collateral enhancement in the left MCA branches. 2. Stable CT appearance of the brain from the initial scan this morning. No acute left MCA ischemia evident by CT. 3. Superimposed severe atherosclerotic stenoses of the right ICA siphon (anterior genu), left ACA A2 segment, and left PCA (with poor left PCA branch enhancement). 4. Up to moderate atherosclerotic stenosis of the non dominant distal right vertebral artery, and basilar artery. 5. High-grade stenosis of the right subclavian artery origin (75%). 6. Large right and moderate to large left layering pleural effusions. Electronically Signed   By: Odessa FlemingH  Hall M.D.   On: 01/09/2016 11:47   Ct Head Wo Contrast  Result Date: 01/08/2016 CLINICAL DATA:  Follow-up code stroke. Status post revascularization of occluded LEFT M1 segment. EXAM: CT HEAD WITHOUT CONTRAST TECHNIQUE: Contiguous axial images were obtained from the base  of the skull through the vertex without intravenous contrast. COMPARISON:  CT HEAD January 25, 2016 at 10:25 a.m. FINDINGS: BRAIN: The ventricles and sulci are normal for age. No intraparenchymal hemorrhage, mass effect nor midline shift. Patchy supratentorial white matter hypodensities less than expected for patient's age, though non-specific are most compatible with chronic small vessel ischemic disease. Slight blurring of the posterior insula gray-white matter differentiation. No abnormal extra-axial fluid collections. Basal cisterns are patent. VASCULAR: Moderate calcific atherosclerosis of the carotid siphons. Mild residual contrast opacification of the intracranial vessels. SKULL: No skull fracture. No significant scalp soft tissue  swelling. Moderate temporomandibular osteoarthrosis. SINUSES/ORBITS: The included ocular globes and orbital contents are non-suspicious. Status post bilateral ocular lens implants. The mastoid air-cells and included paranasal sinuses are well-aerated. OTHER: None. IMPRESSION: Suspected small LEFT MCA territory nonhemorrhagic infarct. Stable involutional changes and mild chronic small vessel ischemic disease. Electronically Signed   By: Awilda Metro M.D.   On: 01/09/2016 13:41   Ct Angio Neck W Or Wo Contrast  Result Date: 01/12/2016 CLINICAL DATA:  82 year old female code stroke with right side weakness. Initial encounter. EXAM: CT ANGIOGRAPHY HEAD AND NECK TECHNIQUE: Multidetector CT imaging of the head and neck was performed using the standard protocol during bolus administration of intravenous contrast. Multiplanar CT image reconstructions and MIPs were obtained to evaluate the vascular anatomy. Carotid stenosis measurements (when applicable) are obtained utilizing NASCET criteria, using the distal internal carotid diameter as the denominator. CONTRAST:  50 mL Isovue 370 COMPARISON:  Head CT without contrast 1025 hours today. FINDINGS: CTA NECK Skeleton: Degenerative changes in the spine. No acute osseous abnormality identified. Visualized paranasal sinuses and mastoids are stable and well pneumatized. Other neck: Moderate to large layering right pleural effusion. Smaller but at least moderate layering left pleural effusion. Fluid may be tracking in the superior aspect of the left major fissure. Moderate to large multinodular thyroid goiter. No superior mediastinal lymphadenopathy. Negative larynx, pharynx, parapharyngeal spaces, retropharyngeal space, sublingual space, submandibular glands and parotid glands. Asymmetric and rounded 8 mm right level 1 B lymph node. Series 6, image 101). Elsewhere cervical lymph nodes appear normal. Aortic arch: 3 vessel arch configuration. Moderate soft and calcified  arch atherosclerosis. Right carotid system: No brachiocephalic artery stenosis. Tortuous right CCA origin with a kinked appearance, but otherwise no stenosis. Mild for age right carotid atherosclerosis in the neck without stenosis. Mildly tortuous distal cervical right ICA. Left carotid system: No left CCA origin stenosis. Tortuous left CCA. Confluent calcified plaque at the left carotid bifurcation involving the left CCA origin and bulb, but stenosis is less than 50 % with respect to the distal vessel. Tortuous cervical left CCA at the level of the pharynx. Vertebral arteries:Tortuous and atherosclerotic right subclavian artery origin with stenosis up to 75 % with respect to the distal vessel. Normal right vertebral artery origin. Negative right vertebral artery to the skullbase. No proximal left subclavian artery stenosis despite calcified plaque. Normal left vertebral artery origin. The left vertebral is mildly dominant. There is minimal calcified plaque but no stenosis along its course to the neck. CTA HEAD Posterior circulation: Dominant distal left vertebral artery with V4 segment calcified plaque resulting in mild stenosis. The non dominant right vertebral artery is more moderately irregular and stenotic in the V4 segment, but remains patent to the vertebrobasilar junction. Mild basilar artery irregularity and stenosis. SCA and right PCA origins remain patent. Diminutive right posterior communicating artery. The left PCA origin is severely stenotic. There is poor flow  in the left PCA branches. Anterior circulation: Both ICA siphons remain patent. There is moderate calcified plaque in both ICA siphons, with severe stenosis at the distal right cavernous segment, and moderate stenosis in the right supraclinoid segment. On the left stenosis is less than 50%. Both carotid termini are patent, but the right ACA A1 segment is non dominant and diminutive. Normal left ACA A1, anterior communicating artery. Moderate to  severe left ACA A2 segment stenosis proximal to the left pericallosal artery. Bilateral ACA branches however remain patent. Right MCA M1 segment, bifurcation, and right MCA branches are within normal limits. There is left MCA occlusion in the mid M1 segment there is an intermediate level of collateral flow in the left MCA branches (series 11, image 18) Venous sinuses: Patent. Anatomic variants: Dominant left vertebral artery and right A1. Delayed phase: There remains no acute cortically based infarct evident in the left hemisphere. Stable gray-white matter differentiation throughout the brain. No acute intracranial hemorrhage identified. No midline shift, mass effect, or evidence of intracranial mass lesion. IMPRESSION: 1. Positive for emergent large vessel occlusion at the left M1 segment. This was discussed with Dr. Hilda BladesArmstrong at 1054 hours. Intermediate level collateral enhancement in the left MCA branches. 2. Stable CT appearance of the brain from the initial scan this morning. No acute left MCA ischemia evident by CT. 3. Superimposed severe atherosclerotic stenoses of the right ICA siphon (anterior genu), left ACA A2 segment, and left PCA (with poor left PCA branch enhancement). 4. Up to moderate atherosclerotic stenosis of the non dominant distal right vertebral artery, and basilar artery. 5. High-grade stenosis of the right subclavian artery origin (75%). 6. Large right and moderate to large left layering pleural effusions. Electronically Signed   By: Odessa FlemingH  Hall M.D.   On: 01/08/2016 11:47   Pelvis Portable  Result Date: 01/26/2016 CLINICAL DATA:  Postop film for right hip arthroplasty. EXAM: PORTABLE PELVIS 1-2 VIEWS COMPARISON:  None. FINDINGS: Right hip arthroplasty hardware appears appropriately positioned. Expected postsurgical changes within the overlying soft tissues. Adjacent osseous pelvis appears intact and normally aligned. IMPRESSION: Expected postsurgical changes status post right hip  arthroplasty. Hardware appears appropriately positioned. No evidence of surgical complicating feature. Electronically Signed   By: Bary RichardStan  Maynard M.D.   On: 01/26/2016 19:07   Dg Chest Port 1 View  Result Date: 01/26/2016 CLINICAL DATA:  Hypoxia EXAM: PORTABLE CHEST 1 VIEW COMPARISON:  January 25, 2016 FINDINGS: Endotracheal tube tip is 2.2 cm above the carina. No pneumothorax. There are persistent pleural effusions on the right with cardiomegaly. Pulmonary vascularity is within normal limits. There is mild interstitial edema. No airspace consolidations seen. There is atherosclerotic calcification in the aorta. No evident adenopathy. IMPRESSION: Endotracheal tube as described without apparent pneumothorax. Persistent changes of congestive heart failure. No airspace consolidation appreciable. Electronically Signed   By: Bretta BangWilliam  Woodruff III M.D.   On: 01/26/2016 07:24   Dg Chest Port 1 View  Result Date: 01/28/2016 CLINICAL DATA:  Patient with history of CVA.  ET tube placement. EXAM: PORTABLE CHEST 1 VIEW COMPARISON:  Chest radiograph 01/23/2016 FINDINGS: ET tube terminates in the mid trachea. Monitoring leads overlie the patient. Stable cardiomegaly. Moderate layering bilateral pleural effusions with underlying consolidation. Worsening interstitial opacities and vascular redistribution. No pneumothorax. IMPRESSION: ET tube terminates in the mid trachea. Layering moderate bilateral pleural effusions with underlying consolidation favored to represent atelectasis. Worsening pulmonary edema. Electronically Signed   By: Annia Beltrew  Davis M.D.   On: 01/09/2016 18:48   Dg  C-arm 1-60 Min  Result Date: 01/10/2016 CLINICAL DATA:  Right anterior hip replacement. EXAM: OPERATIVE RIGHT HIP (WITH PELVIS IF PERFORMED)  VIEWS TECHNIQUE: Fluoroscopic spot image(s) were submitted for interpretation post-operatively. COMPARISON:  None. FINDINGS: Two fluoroscopic spot images are provided showing placement of right hip  arthroplasty hardware. Hardware appears appropriately positioned. No evidence of surgical complicating feature appreciated. 18 seconds of fluoroscopy provided for the exam. IMPRESSION: Intraoperative fluoroscopic spot images showing placement of right hip arthroplasty hardware. 18 seconds of fluoroscopy provided. Electronically Signed   By: Bary Richard M.D.   On: 01/21/2016 18:38   Dg Hip Operative Unilat W Or W/o Pelvis Right  Result Date: 01/19/2016 CLINICAL DATA:  Right anterior hip replacement. EXAM: OPERATIVE RIGHT HIP (WITH PELVIS IF PERFORMED)  VIEWS TECHNIQUE: Fluoroscopic spot image(s) were submitted for interpretation post-operatively. COMPARISON:  None. FINDINGS: Two fluoroscopic spot images are provided showing placement of right hip arthroplasty hardware. Hardware appears appropriately positioned. No evidence of surgical complicating feature appreciated. 18 seconds of fluoroscopy provided for the exam. IMPRESSION: Intraoperative fluoroscopic spot images showing placement of right hip arthroplasty hardware. 18 seconds of fluoroscopy provided. Electronically Signed   By: Bary Richard M.D.   On: 01/07/2016 18:38   Ct Head Code Stroke Wo Contrast`  Result Date: 01/23/2016 CLINICAL DATA:  Code stroke. Right-sided weakness and right facial droop. Hip replacement yesterday EXAM: CT HEAD WITHOUT CONTRAST TECHNIQUE: Contiguous axial images were obtained from the base of the skull through the vertex without intravenous contrast. COMPARISON:  CT head 01/19/2016 FINDINGS: Generalized atrophy. Benign cyst right basal ganglia unchanged. Negative for acute infarct. Negative for intracranial hemorrhage. No mass or edema. No change from the recent CT Atherosclerotic calcification in the carotid and vertebral arteries bilaterally. No acute abnormality in the skull. Small air-fluid level sphenoid sinus. Bilateral lens replacement. ASPECTS [facility] Stroke Program Early CT Score) - Ganglionic level infarction  (caudate, lentiform nuclei, internal capsule, insula, M1-M3 cortex): 7 - Supraganglionic infarction (M4-M6 cortex): 3 Total score (0-10 with 10 being normal): 10 IMPRESSION: 1. Negative for acute infarct. 2. ASPECTS is 10 These results were called by telephone at the time of interpretation on 01/07/2016 at 10:37 am to Dr. Hilda Blades, who verbally acknowledged these results. Electronically Signed   By: Marlan Palau M.D.   On: 01/26/2016 10:37       PHYSICAL EXAM Temp:  [97.7 F (36.5 C)-99.1 F (37.3 C)] 98.2 F (36.8 C) (08/23 1200) Pulse Rate:  [67-101] 79 (08/23 1300) Resp:  [6-26] 23 (08/23 1300) BP: (78-155)/(38-105) 155/92 (08/23 1300) SpO2:  [95 %-100 %] 100 % (08/23 1300) Arterial Line BP: (74-140)/(39-104) 138/60 (08/23 1300) FiO2 (%):  [40 %] 40 % (08/23 1126) Weight:  [80.1 kg (176 lb 9.4 oz)] 80.1 kg (176 lb 9.4 oz) (08/23 0500)  General - Well nourished, well developed, in no apparent distress.  Cardiovascular - Irregular rhythm.  Mental Status -  Sedated on ventilator. Not following commands. Opens eyes only partially even to sternal rub. Does not blink to threat. Pupils irregular but sluggishly reactive. Fundi were not visualized. No facial weakness. Moves bilateral UE spontaneously but left more than right. Suspect mild right-sided weakness.  Both legs withdraws to pain Bulk was normal and fasciculations were absent.   Muscle tone was assessed at the neck and appendages and was normal.  ASSESSMENT/PLAN Ms. DONDI BURANDT is a 82 y.o. female with history of CAD s/p DES in 2014, DM2, diastolic dysfunction, HLD, HTN, Hypothyroidism, atrial fibrillation not on anticoagulation  due to falls presenting with left gaze deviation, right facial droop, R extremity weakness, does not follow verbal commands.    Stroke:  Left MCA infarct embolic secondary to atrial fibrillation  Resultant  R extremity weakness  MRI  pending  2D Echo  60-65% EF, normal wall motion and no  regional wall motion abnormalities. Severe focal basal hypertrophy of the septum  LDL 73  HgbA1c pending  Lovenox for VTE prophylaxis  Diet NPO time specified  aspirin 81 mg daily prior to admission, now on No antithrombotic  Ongoing aggressive stroke risk factor management  Therapy recommendations:  pending  Disposition:  pending  Atrial Fibrillation  No anticoagulation due to recurrent falls  Rate controlled  Continue Coreg BID, IV metoprolol prn  Hypertension  Stable  Goal SBP <160  Continue carvedilol, irbesartan, Lasix  Off cardene gtt  IV Hydralazine, Metoprolol prn  Long-term BP goal normotensive  Hyperlipidemia  LDL 73, goal < 70  Add statin when tolerating PO  Diabetes  HgbA1c pending, goal < 7.0  SSI  Other Stroke Risk Factors  Advanced age  Obesity, Body mass index is 30.31 kg/m., recommend weight loss, diet and exercise as appropriate   Coronary artery disease  Other Active Problems  Diastolic dysfunction: IV Lasix 40mg  BID  Hip Fracture s/p repair  Hypothyroidism, on Synthroid  Goals of care: Son expressed that his mother would not want to be on a ventilator long term and would want to be DNR. She would not want to have trach/PEG.  Hospital day # 4  Discussed with Dr. Bernerd Limbo, MD  Internal Medicine PGY-3 I have personally examined this patient, reviewed notes, independently viewed imaging studies, participated in medical decision making and plan of care. I have made any additions or clarifications directly to the above note. Agree with note above. She developed aphasia and right hemiplegia secondary to left MCA occlusion and underwent mechanical embolectomy with complete revascularization. She remains at risk for neurological worsening, recurrent stroke needs close neurological monitoring and blood pressure control. I had a long discussion with the bedside with the patient's son regarding her prognosis and answered  questions. Son agrees to DO NOT RESUSCITATE and no reintubation once she is extubated. Recommend we check MRI scan of the brain and make a decision about extubation tomorrow if she is okay from cardiac and pulmonary standpoint. This patient is critically ill and at significant risk of neurological worsening, death and care requires constant monitoring of vital signs, hemodynamics,respiratory and cardiac monitoring, extensive review of multiple databases, frequent neurological assessment, discussion with family, other specialists and medical decision making of high complexity.I have made any additions or clarifications directly to the above note.This critical care time does not reflect procedure time, or teaching time or supervisory time of PA/NP/Med Resident etc but could involve care discussion time.  I spent 60 minutes of neurocritical care time  in the care of  this patient.     Delia Heady, MD Medical Director [facility] Pager: [phone] 01/26/2016 5:28 PM    To contact Stroke Continuity provider, please refer to [email or web address]. After hours, contact General Neurology

## 2016-01-26 NOTE — Progress Notes (Signed)
PULMONARY / CRITICAL CARE MEDICINE   Name: Jacqueline [name] MRN: [id number] DOB: 05-Oct-1916    ADMISSION DATE:  01/29/2016 CONSULTATION DATE:  01/28/2016  REFERRING MD:  Dr. Corliss Skains  CHIEF COMPLAINT: Left M-1 segment occlussion  HISTORY OF PRESENT ILLNESS:  82 year old female with PMH of DM, chronic AF not on anticoagulation secondary to chronic falls (off eliquis 6 months ago), CAD, AS, Diastolic HF, HTN, HLD, hypothyroidism admitted 8/19 after a mechanical fall..  She is a resident at assisted living and is fairly active.  Unfortunately she suffered a mechanical fall on 8/19 and fractured her right femoral neck.  Pre-op she was seen by Cardiology for clearance and given high risk given PMH and age.  Surgery was postponed secondary to development of pulmonary edema and hypoxia on the 8/20 which improved with lasix and oxygen.  The patient underwent a right hemi-arthroplasty on the evening of 8/21 and was extubated without complications. She was placed on lovenox for DVT coverage.  On the morning of 8/22 she was LKW around 0830, then later found with left gaze, right hemiplegia, right facial droop, right hemianopia, and NIHSS of 28.  CTA showed occlusion of left M-1 segment of MCA.  She was intubated and underwent cerebral angiogram with complete revascularization.  The patient remained intubated post-procedure and transferred to Neuro ICU.  PCCM consulted for further ventilator and ICU management.    SUBJECTIVE:  No acute events. Patient's sheath removed. Patient not following commands with lightening of sedation.  REVIEW OF SYSTEMS:  Unable to be assess as patient is intubated.   VITAL SIGNS: BP (!) 104/45   Pulse 77   Temp 97.7 F (36.5 C) (Axillary)   Resp 15   Ht 5\' 4"  (1.626 m)   Wt 176 lb 9.4 oz (80.1 kg)   LMP  (LMP Unknown)   SpO2 100%   BMI 30.31 kg/m   HEMODYNAMICS:    VENTILATOR SETTINGS: Vent Mode: PRVC FiO2 (%):  [40 %-60 %] 40 % Set Rate:  [14 bmp] 14 bmp Vt Set:   [440 mL] 440 mL PEEP:  [5 cmH20] 5 cmH20 Pressure Support:  [10 cmH20] 10 cmH20 Plateau Pressure:  [15 cmH20] 15 cmH20  INTAKE / OUTPUT: I/O last 3 completed shifts: In: 1903.6 [P.O.:720; I.V.:723.6; Other:260; IV Piggyback:200] Out: 1530 [Urine:1530]  PHYSICAL EXAMINATION: General:  Elderly female. No distress. Family at bedside.  Neuro: Opens left eye intermittently. Does not follow commands. Moves left arm and left foot/leg to stimulation. Pupils symmetric. HEENT:  Endotracheal tube in place. No scleral icterus or injection. Moist mucous membranes.  Cardiovascular:  regular rate with a regular rhythm. No appreciable JVD. No edema.  Lungs:  Coarse breath sounds bilaterally. Symmetric chest wall rise on ventilator.  Abdomen:  soft. Nondistended. Normal bowel sounds.  Musculoskeletal:  No acute deformities.  Dressing to right anterior hip clean, dry, and intact.  Skin:  Warm and dry. No rash on exposed skin.   LABS:  BMET  Recent Labs Lab 01/17/2016 0303 01/22/2016 1125 01/26/16 0550  NA 138 135 138  K 3.5 3.7 3.7  CL 105 103 105  CO2 25 26 26   BUN 27* 37* 37*  CREATININE 1.29* 1.20* 1.09*  GLUCOSE 144* 112* 102*    Electrolytes  Recent Labs Lab 01/23/16 0553 01/27/2016 0303 01/27/2016 0710 Jan 30, 2016 1125 01/26/16 0550  CALCIUM 9.2 8.9  --  8.4* 8.5*  MG 2.1  --  2.1  --  2.2  PHOS 4.2  --   --   --  3.2    CBC  Recent Labs Lab 01/22/2016 0303 01/24/2016 0832 01/26/16 0550  WBC 15.0* 8.6 9.7  HGB 14.1 12.6 11.6*  HCT 43.8 39.7 36.5  PLT 115* 93* 113*    Coag's  Recent Labs Lab 01/16/2016 1125  INR 1.34    Sepsis Markers No results for input(s): LATICACIDVEN, PROCALCITON, O2SATVEN in the last 168 hours.  ABG  Recent Labs Lab 01/16/2016 1653 01/26/16 0230  PHART 7.375 7.385  PCO2ART 44.5 44.8  PO2ART 79.0* 111*    Liver Enzymes  Recent Labs Lab 01/23/16 0553 02/02/2016 0303  AST 25 21  ALT 18 16  ALKPHOS 104 79  BILITOT 1.2 1.2  ALBUMIN 3.4*  3.1*    Cardiac Enzymes  Recent Labs Lab 02-01-2016 2301 01/23/16 0553  TROPONINI 0.03* 0.07*    Glucose  Recent Labs Lab 01/22/2016 1612 01/12/2016 1938 01/11/2016 0954  GLUCAP 104* 112* 112*    Imaging Ct Head Wo Contrast  Result Date: 01/28/2016 CLINICAL DATA:  Follow-up code stroke. Status post revascularization of occluded LEFT M1 segment. EXAM: CT HEAD WITHOUT CONTRAST TECHNIQUE: Contiguous axial images were obtained from the base of the skull through the vertex without intravenous contrast. COMPARISON:  CT HEAD January 25, 2016 at 10:25 a.m. FINDINGS: BRAIN: The ventricles and sulci are normal for age. No intraparenchymal hemorrhage, mass effect nor midline shift. Patchy supratentorial white matter hypodensities less than expected for patient's age, though non-specific are most compatible with chronic small vessel ischemic disease. Slight blurring of the posterior insula gray-white matter differentiation. No abnormal extra-axial fluid collections. Basal cisterns are patent. VASCULAR: Moderate calcific atherosclerosis of the carotid siphons. Mild residual contrast opacification of the intracranial vessels. SKULL: No skull fracture. No significant scalp soft tissue swelling. Moderate temporomandibular osteoarthrosis. SINUSES/ORBITS: The included ocular globes and orbital contents are non-suspicious. Status post bilateral ocular lens implants. The mastoid air-cells and included paranasal sinuses are well-aerated. OTHER: None. IMPRESSION: Suspected small LEFT MCA territory nonhemorrhagic infarct. Stable involutional changes and mild chronic small vessel ischemic disease. Electronically Signed   By: Awilda Metro M.D.   On: 02/03/2016 13:41   Dg Chest Port 1 View  Result Date: 01/26/2016 CLINICAL DATA:  Hypoxia EXAM: PORTABLE CHEST 1 VIEW COMPARISON:  January 25, 2016 FINDINGS: Endotracheal tube tip is 2.2 cm above the carina. No pneumothorax. There are persistent pleural effusions on the  right with cardiomegaly. Pulmonary vascularity is within normal limits. There is mild interstitial edema. No airspace consolidations seen. There is atherosclerotic calcification in the aorta. No evident adenopathy. IMPRESSION: Endotracheal tube as described without apparent pneumothorax. Persistent changes of congestive heart failure. No airspace consolidation appreciable. Electronically Signed   By: Bretta Bang III M.D.   On: 01/26/2016 07:24   Dg Chest Port 1 View  Result Date: 01/23/2016 CLINICAL DATA:  Patient with history of CVA.  ET tube placement. EXAM: PORTABLE CHEST 1 VIEW COMPARISON:  Chest radiograph 01/23/2016 FINDINGS: ET tube terminates in the mid trachea. Monitoring leads overlie the patient. Stable cardiomegaly. Moderate layering bilateral pleural effusions with underlying consolidation. Worsening interstitial opacities and vascular redistribution. No pneumothorax. IMPRESSION: ET tube terminates in the mid trachea. Layering moderate bilateral pleural effusions with underlying consolidation favored to represent atelectasis. Worsening pulmonary edema. Electronically Signed   By: Annia Belt M.D.   On: 01/10/2016 18:48     STUDIES:  CT C-Spine 8/19: no acute findings; mild degenerative changes CT Head 8/19: atrophy and chronic small vessel ischemic change; no acute/traumatic finding TTE 8/21: mod  LVH; severe focal basal hypertrophy of the septum; LVEF 60-65%; normal wall motion, no regional wall motion abnormalities; mild AS with trivial AR; MV with calcified annulus and mildly thickened leaflets, LA mod dilated, RA severely dilated; mod TR, PAS 53 mmHg CTA Head/Neck 8/22: large occlusion of left M1 segment,  Severe atherosclerotic stenosis of right ICA siphon, left ACA A2 segment, and left PCA with poor branch enhancement; moderate atherosclerotic stenosis of the non dominant distal right vertebral artery and basilar artery; high grade stenosis of RCA origin; large right and mod to  large left layering pleural efusions CT Head 8/22 (post intervention): suspected small left MCA territory nonhemorrhagic infarct; stable involutional changes and mild chronic small vessel ischemic disease Port CXR 8/23: Bilateral lower lung opacities. Endotracheal tube in good position. MRI 8/23 >>   MICROBIOLOGY: MRSA PCR 8/20:  Negative   ANTIBIOTICS: Ancef 8/21 Periop PPx  SIGNIFICANT EVENTS: 8/19 -  Admit after fall with R fem neck fx 8/20 -  Pulmonary edema, hypoxia that improved with lasix / O2 8/21 -  OR for R anterior ORIF 8/22 -  L M1 CVA, to neuro IR for revascularization   LINES/TUBES: R fem sheath 8/22 - 8/23 OETT 7.5 8/22 >> L Radial Art Line 8/22 >> Foley >> PIV x2  ASSESSMENT / PLAN:  NEUROLOGIC A:   L M-1 CVA - S/P Revascularization 8/22.  P:   RASS goal: -1 Propofol gtt May transition to Precedex after MRI Fentanyl IV prn Further Neuro imaging per Neurology   PULMONARY A: Acute Hypoxic Respiratory Failure - Multifactorial w/ acute CVA, AMS, & Probable Pulmonary Edema. Bilateral Pleural Effusions  P:   Full Vent Support Wean PEEP / FiO2 for sats > 92% Daily SBT Diuresis w/ Lasix IV  CARDIOVASCULAR A:  PAF - Not on anticoagulation due to increased fall risk  HTN H/O MildAortic Stenosis H/O HLD H/O Diastolic Dysfunction  H/O CAD  P:  Cardiology Following ICU monitoring of hemodynamics  SBP goal 100-140 per Neuro IR  Cardene gtt as needed Continue Coreg 12.5mg  bid & Avapro 150mg  daily Lopressor IV prn Lasix IV q12hr Plan to resume ASA when ok with Neuro IR  RENAL A:   No acute issue.  P:   Trending UOP with Foley Monitoring electrolytes & renal function daily Replacing electrolytes as indicated  GASTROINTESTINAL A:   Constipation  P:   NPO  Protonix VT daily Senna & Colace bid VT  HEMATOLOGIC A:   Thrombocytopenia - Mild. Leukocytosis - Resolved.  P:  Trending cell counts daily w/ CBC SCDs Lovenox 30mg  [place]  daily  INFECTIOUS A:   No acute infection.  P:   Monitor surgical site  Trend fever / WBC curve  ENDOCRINE A:   H/O DM - Glucose controlled. A1c 5.6. H/O Hypothyroidism   P:   Accu-Checks q4hr SSI per Sensitive Algorithm Synthroid 100mcg VT daily  ORTHOPEDIC A: S/P R Hip Arthroplasty  P: PT/OT Consulted Likely will need SNF post discharge  FAMILY  - Updates: Daughter & grand-son updated at bedside 8/23.   - Inter-disciplinary family meet or Palliative Care meeting due by:  8/29  TODAY'S SUMMARY:  82 y.o. female with PMH of Afib (not on AC 2/2 to frequent falls), DM, Diastolic HF, AS, and HTN  admitted 8/19 after mechanical fall sustaining R neck femoral neck fracture.  Developed pulmonary edema 8/20 and hypoxia thought secondary to IVF improved after lasix which delayed surgery. Taken to the OR on 8/21 for right hip  hemiarthroplasty.  On 8/22 found with right hemiplegia, hemianopia, facial droop, and left gaze with NIHSS 28.  Taken to neuro IR for revascularization.  Stable post revascularization. Plan for MRI today and spontaneous breathing trial afterward. Patient will likely need further diuresis before attempt at extubation.   I have spent a total of  36 minutes of critical care time today caring for the patient, updating family at bedside, and reviewing the patient's electronic medical record.   Donna ChristenJennings E. Jamison NeighborNestor, M.D. [facility] Pager:  [phone] After 3pm or if no response, call [phone]\ 01/26/2016, 12:30 PM

## 2016-01-26 NOTE — Progress Notes (Signed)
OT Cancellation Note  Patient Details Name: Jacqueline GableMary E [name] MRN: [id number] DOB: 11/15/1916   Cancelled Treatment:    Reason Eval/Treat Not Completed: Medical issues which prohibited therapy (sheath just removed, bedrest, will hold )  Gaye AlkenBailey A Navya Timmons M.S., OTR/L Pager: [phone]  01/26/2016, 11:45 AM

## 2016-01-26 NOTE — Progress Notes (Signed)
Patient ID: Jacqueline [name], female   DOB: Mar 09, 1917, 82 y.o.   MRN: [id number]    Referring Physician(s): Dr. Rose Fillers  Supervising Physician: Julieanne Cotton  Patient Status: inpt  Chief Complaint: (L) MCA CVA  Subjective: Patient sedated on the vent  Allergies: Aspirin and Amlodipine  Medications: Prior to Admission medications   Medication Sig Start Date End Date Taking? Authorizing Provider  acetaminophen (TYLENOL) 325 MG tablet Take 650 mg by mouth every 6 (six) hours as needed for pain.   Yes Historical Provider, MD  aspirin EC 81 MG tablet Take 1 tablet (81 mg total) by mouth daily. 08/12/15  Yes Peter M [place], MD  furosemide (LASIX) 40 MG tablet Take 1 tablet (40 mg total) by mouth daily. 05/19/13  Yes Belkys A Regalado, MD  levothyroxine (SYNTHROID, LEVOTHROID) 112 MCG tablet Take 112 mcg by mouth daily before breakfast.   Yes Historical Provider, MD  olmesartan (BENICAR) 20 MG tablet Take 20 mg by mouth daily.   Yes Historical Provider, MD  potassium chloride SA (K-DUR,KLOR-CON) 20 MEQ tablet Take 20 mEq by mouth daily.    Yes Historical Provider, MD  carvedilol (COREG) 25 MG tablet TAKE 1 TABLET TWICE A DAY WITH MEALS Patient not taking: Reported on 01/23/2016    Peter M [place], MD  nitroGLYCERIN (NITROSTAT) 0.4 MG SL tablet Place 1 tablet (0.4 mg total) under the tongue every 5 (five) minutes as needed for chest pain. Patient not taking: Reported on 01/23/2016 05/12/15   Peter M [place], MD    Vital Signs: BP 138/65   Pulse 90   Temp 97.7 F (36.5 C) (Axillary)   Resp (!) 9   Ht 5\' 4"  (1.626 m)   Wt 176 lb 9.4 oz (80.1 kg)   LMP  (LMP Unknown)   SpO2 98%   BMI 30.31 kg/m   Physical Exam: Gen: sedated on vent Skin: right inguinal site with sheath in place. No bleeding.  Imaging: Ct Angio Head W Or Wo Contrast  Result Date: 01/15/2016 CLINICAL DATA:  82 year old female code stroke with right side weakness. Initial encounter. EXAM: CT ANGIOGRAPHY HEAD  AND NECK TECHNIQUE: Multidetector CT imaging of the head and neck was performed using the standard protocol during bolus administration of intravenous contrast. Multiplanar CT image reconstructions and MIPs were obtained to evaluate the vascular anatomy. Carotid stenosis measurements (when applicable) are obtained utilizing NASCET criteria, using the distal internal carotid diameter as the denominator. CONTRAST:  50 mL Isovue 370 COMPARISON:  Head CT without contrast 1025 hours today. FINDINGS: CTA NECK Skeleton: Degenerative changes in the spine. No acute osseous abnormality identified. Visualized paranasal sinuses and mastoids are stable and well pneumatized. Other neck: Moderate to large layering right pleural effusion. Smaller but at least moderate layering left pleural effusion. Fluid may be tracking in the superior aspect of the left major fissure. Moderate to large multinodular thyroid goiter. No superior mediastinal lymphadenopathy. Negative larynx, pharynx, parapharyngeal spaces, retropharyngeal space, sublingual space, submandibular glands and parotid glands. Asymmetric and rounded 8 mm right level 1 B lymph node. Series 6, image 101). Elsewhere cervical lymph nodes appear normal. Aortic arch: 3 vessel arch configuration. Moderate soft and calcified arch atherosclerosis. Right carotid system: No brachiocephalic artery stenosis. Tortuous right CCA origin with a kinked appearance, but otherwise no stenosis. Mild for age right carotid atherosclerosis in the neck without stenosis. Mildly tortuous distal cervical right ICA. Left carotid system: No left CCA origin stenosis. Tortuous left CCA. Confluent calcified plaque at the  left carotid bifurcation involving the left CCA origin and bulb, but stenosis is less than 50 % with respect to the distal vessel. Tortuous cervical left CCA at the level of the pharynx. Vertebral arteries:Tortuous and atherosclerotic right subclavian artery origin with stenosis up to 75 %  with respect to the distal vessel. Normal right vertebral artery origin. Negative right vertebral artery to the skullbase. No proximal left subclavian artery stenosis despite calcified plaque. Normal left vertebral artery origin. The left vertebral is mildly dominant. There is minimal calcified plaque but no stenosis along its course to the neck. CTA HEAD Posterior circulation: Dominant distal left vertebral artery with V4 segment calcified plaque resulting in mild stenosis. The non dominant right vertebral artery is more moderately irregular and stenotic in the V4 segment, but remains patent to the vertebrobasilar junction. Mild basilar artery irregularity and stenosis. SCA and right PCA origins remain patent. Diminutive right posterior communicating artery. The left PCA origin is severely stenotic. There is poor flow in the left PCA branches. Anterior circulation: Both ICA siphons remain patent. There is moderate calcified plaque in both ICA siphons, with severe stenosis at the distal right cavernous segment, and moderate stenosis in the right supraclinoid segment. On the left stenosis is less than 50%. Both carotid termini are patent, but the right ACA A1 segment is non dominant and diminutive. Normal left ACA A1, anterior communicating artery. Moderate to severe left ACA A2 segment stenosis proximal to the left pericallosal artery. Bilateral ACA branches however remain patent. Right MCA M1 segment, bifurcation, and right MCA branches are within normal limits. There is left MCA occlusion in the mid M1 segment there is an intermediate level of collateral flow in the left MCA branches (series 11, image 18) Venous sinuses: Patent. Anatomic variants: Dominant left vertebral artery and right A1. Delayed phase: There remains no acute cortically based infarct evident in the left hemisphere. Stable gray-white matter differentiation throughout the brain. No acute intracranial hemorrhage identified. No midline shift, mass  effect, or evidence of intracranial mass lesion. IMPRESSION: 1. Positive for emergent large vessel occlusion at the left M1 segment. This was discussed with Dr. Hilda Blades at 1054 hours. Intermediate level collateral enhancement in the left MCA branches. 2. Stable CT appearance of the brain from the initial scan this morning. No acute left MCA ischemia evident by CT. 3. Superimposed severe atherosclerotic stenoses of the right ICA siphon (anterior genu), left ACA A2 segment, and left PCA (with poor left PCA branch enhancement). 4. Up to moderate atherosclerotic stenosis of the non dominant distal right vertebral artery, and basilar artery. 5. High-grade stenosis of the right subclavian artery origin (75%). 6. Large right and moderate to large left layering pleural effusions. Electronically Signed   By: Odessa Fleming M.D.   On: 01/08/2016 11:47   Dg Chest 2 View  Result Date: 01/29/2016 CLINICAL DATA:  Fall.  Right hip pain.  No reported back pain. EXAM: CHEST  2 VIEW COMPARISON:  04/17/2015 FINDINGS: Bilateral pleural effusions also seen on prior exam. Mild pulmonary venous congestion. No convincing consolidation. No pneumothorax. Chronic cardiomegaly. Mediastinal contours are distorted by rotation. No visible fracture. Exaggerated thoracic kyphosis. Cannot exclude lower thoracic compression deformities, limited by overlapping shadows, but no reported back pain. IMPRESSION: Chronic bilateral pleural effusion, borderline moderate on the right. Pulmonary venous congestion. Electronically Signed   By: Marnee Spring M.D.   On: 01/06/2016 21:10   Ct Head Wo Contrast  Result Date: 01/05/2016 CLINICAL DATA:  Follow-up code stroke. Status  post revascularization of occluded LEFT M1 segment. EXAM: CT HEAD WITHOUT CONTRAST TECHNIQUE: Contiguous axial images were obtained from the base of the skull through the vertex without intravenous contrast. COMPARISON:  CT HEAD 2016/02/22 at 10:25 a.m. FINDINGS: BRAIN: The  ventricles and sulci are normal for age. No intraparenchymal hemorrhage, mass effect nor midline shift. Patchy supratentorial white matter hypodensities less than expected for patient's age, though non-specific are most compatible with chronic small vessel ischemic disease. Slight blurring of the posterior insula gray-white matter differentiation. No abnormal extra-axial fluid collections. Basal cisterns are patent. VASCULAR: Moderate calcific atherosclerosis of the carotid siphons. Mild residual contrast opacification of the intracranial vessels. SKULL: No skull fracture. No significant scalp soft tissue swelling. Moderate temporomandibular osteoarthrosis. SINUSES/ORBITS: The included ocular globes and orbital contents are non-suspicious. Status post bilateral ocular lens implants. The mastoid air-cells and included paranasal sinuses are well-aerated. OTHER: None. IMPRESSION: Suspected small LEFT MCA territory nonhemorrhagic infarct. Stable involutional changes and mild chronic small vessel ischemic disease. Electronically Signed   By: Awilda Metro M.D.   On: 02-22-2016 13:41   Ct Head Wo Contrast  Result Date: 02/03/2016 CLINICAL DATA:  Unwitnessed fall headache and neck pain. EXAM: CT HEAD WITHOUT CONTRAST CT CERVICAL SPINE WITHOUT CONTRAST TECHNIQUE: Multidetector CT imaging of the head and cervical spine was performed following the standard protocol without intravenous contrast. Multiplanar CT image reconstructions of the cervical spine were also generated. COMPARISON:  06/15/2014 FINDINGS: CT HEAD FINDINGS Generalized age related atrophy. Mild chronic small-vessel ischemic change of the cerebral hemispheric white matter. No sign of acute infarction, mass lesion, hemorrhage, hydrocephalus or extra-axial collection. No skull fracture. No fluid in the sinuses. There is atherosclerotic calcification of the major vessels at the base of the brain. CT CERVICAL SPINE FINDINGS No malalignment. No visible soft  tissue swelling. No fracture. Chronic facet fusion on the right at C4-5. Mild degenerative changes above and below that. Nitrogen gas in the disc spaces of C5-6 and C6-7 is degenerative and was present on the previous study. Chronic thyroid goiter again demonstrated. Some pleural fluid bilaterally. IMPRESSION: Head CT: Atrophy and chronic small vessel ischemic change. No acute or traumatic finding. CT cervical spine: No acute or traumatic finding. Mild degenerative changes. Electronically Signed   By: Paulina Fusi M.D.   On: 01/31/2016 22:36   Ct Angio Neck W Or Wo Contrast  Result Date: 02-22-16 CLINICAL DATA:  82 year old female code stroke with right side weakness. Initial encounter. EXAM: CT ANGIOGRAPHY HEAD AND NECK TECHNIQUE: Multidetector CT imaging of the head and neck was performed using the standard protocol during bolus administration of intravenous contrast. Multiplanar CT image reconstructions and MIPs were obtained to evaluate the vascular anatomy. Carotid stenosis measurements (when applicable) are obtained utilizing NASCET criteria, using the distal internal carotid diameter as the denominator. CONTRAST:  50 mL Isovue 370 COMPARISON:  Head CT without contrast 1025 hours today. FINDINGS: CTA NECK Skeleton: Degenerative changes in the spine. No acute osseous abnormality identified. Visualized paranasal sinuses and mastoids are stable and well pneumatized. Other neck: Moderate to large layering right pleural effusion. Smaller but at least moderate layering left pleural effusion. Fluid may be tracking in the superior aspect of the left major fissure. Moderate to large multinodular thyroid goiter. No superior mediastinal lymphadenopathy. Negative larynx, pharynx, parapharyngeal spaces, retropharyngeal space, sublingual space, submandibular glands and parotid glands. Asymmetric and rounded 8 mm right level 1 B lymph node. Series 6, image 101). Elsewhere cervical lymph nodes appear normal. Aortic  arch: 3 vessel arch configuration. Moderate soft and calcified arch atherosclerosis. Right carotid system: No brachiocephalic artery stenosis. Tortuous right CCA origin with a kinked appearance, but otherwise no stenosis. Mild for age right carotid atherosclerosis in the neck without stenosis. Mildly tortuous distal cervical right ICA. Left carotid system: No left CCA origin stenosis. Tortuous left CCA. Confluent calcified plaque at the left carotid bifurcation involving the left CCA origin and bulb, but stenosis is less than 50 % with respect to the distal vessel. Tortuous cervical left CCA at the level of the pharynx. Vertebral arteries:Tortuous and atherosclerotic right subclavian artery origin with stenosis up to 75 % with respect to the distal vessel. Normal right vertebral artery origin. Negative right vertebral artery to the skullbase. No proximal left subclavian artery stenosis despite calcified plaque. Normal left vertebral artery origin. The left vertebral is mildly dominant. There is minimal calcified plaque but no stenosis along its course to the neck. CTA HEAD Posterior circulation: Dominant distal left vertebral artery with V4 segment calcified plaque resulting in mild stenosis. The non dominant right vertebral artery is more moderately irregular and stenotic in the V4 segment, but remains patent to the vertebrobasilar junction. Mild basilar artery irregularity and stenosis. SCA and right PCA origins remain patent. Diminutive right posterior communicating artery. The left PCA origin is severely stenotic. There is poor flow in the left PCA branches. Anterior circulation: Both ICA siphons remain patent. There is moderate calcified plaque in both ICA siphons, with severe stenosis at the distal right cavernous segment, and moderate stenosis in the right supraclinoid segment. On the left stenosis is less than 50%. Both carotid termini are patent, but the right ACA A1 segment is non dominant and diminutive.  Normal left ACA A1, anterior communicating artery. Moderate to severe left ACA A2 segment stenosis proximal to the left pericallosal artery. Bilateral ACA branches however remain patent. Right MCA M1 segment, bifurcation, and right MCA branches are within normal limits. There is left MCA occlusion in the mid M1 segment there is an intermediate level of collateral flow in the left MCA branches (series 11, image 18) Venous sinuses: Patent. Anatomic variants: Dominant left vertebral artery and right A1. Delayed phase: There remains no acute cortically based infarct evident in the left hemisphere. Stable gray-white matter differentiation throughout the brain. No acute intracranial hemorrhage identified. No midline shift, mass effect, or evidence of intracranial mass lesion. IMPRESSION: 1. Positive for emergent large vessel occlusion at the left M1 segment. This was discussed with Dr. Hilda BladesArmstrong at 1054 hours. Intermediate level collateral enhancement in the left MCA branches. 2. Stable CT appearance of the brain from the initial scan this morning. No acute left MCA ischemia evident by CT. 3. Superimposed severe atherosclerotic stenoses of the right ICA siphon (anterior genu), left ACA A2 segment, and left PCA (with poor left PCA branch enhancement). 4. Up to moderate atherosclerotic stenosis of the non dominant distal right vertebral artery, and basilar artery. 5. High-grade stenosis of the right subclavian artery origin (75%). 6. Large right and moderate to large left layering pleural effusions. Electronically Signed   By: Odessa FlemingH  Hall M.D.   On: 01/15/2016 11:47   Ct Cervical Spine Wo Contrast  Result Date: 2016/04/29 CLINICAL DATA:  Unwitnessed fall headache and neck pain. EXAM: CT HEAD WITHOUT CONTRAST CT CERVICAL SPINE WITHOUT CONTRAST TECHNIQUE: Multidetector CT imaging of the head and cervical spine was performed following the standard protocol without intravenous contrast. Multiplanar CT image reconstructions of  the cervical spine were also  generated. COMPARISON:  06/15/2014 FINDINGS: CT HEAD FINDINGS Generalized age related atrophy. Mild chronic small-vessel ischemic change of the cerebral hemispheric white matter. No sign of acute infarction, mass lesion, hemorrhage, hydrocephalus or extra-axial collection. No skull fracture. No fluid in the sinuses. There is atherosclerotic calcification of the major vessels at the base of the brain. CT CERVICAL SPINE FINDINGS No malalignment. No visible soft tissue swelling. No fracture. Chronic facet fusion on the right at C4-5. Mild degenerative changes above and below that. Nitrogen gas in the disc spaces of C5-6 and C6-7 is degenerative and was present on the previous study. Chronic thyroid goiter again demonstrated. Some pleural fluid bilaterally. IMPRESSION: Head CT: Atrophy and chronic small vessel ischemic change. No acute or traumatic finding. CT cervical spine: No acute or traumatic finding. Mild degenerative changes. Electronically Signed   By: Paulina Fusi M.D.   On: 01/06/2016 22:36   Pelvis Portable  Result Date: 01/29/2016 CLINICAL DATA:  Postop film for right hip arthroplasty. EXAM: PORTABLE PELVIS 1-2 VIEWS COMPARISON:  None. FINDINGS: Right hip arthroplasty hardware appears appropriately positioned. Expected postsurgical changes within the overlying soft tissues. Adjacent osseous pelvis appears intact and normally aligned. IMPRESSION: Expected postsurgical changes status post right hip arthroplasty. Hardware appears appropriately positioned. No evidence of surgical complicating feature. Electronically Signed   By: Bary Richard M.D.   On: 01/28/2016 19:07   Dg Chest Port 1 View  Result Date: 01/26/2016 CLINICAL DATA:  Hypoxia EXAM: PORTABLE CHEST 1 VIEW COMPARISON:  January 25, 2016 FINDINGS: Endotracheal tube tip is 2.2 cm above the carina. No pneumothorax. There are persistent pleural effusions on the right with cardiomegaly. Pulmonary vascularity is within  normal limits. There is mild interstitial edema. No airspace consolidations seen. There is atherosclerotic calcification in the aorta. No evident adenopathy. IMPRESSION: Endotracheal tube as described without apparent pneumothorax. Persistent changes of congestive heart failure. No airspace consolidation appreciable. Electronically Signed   By: Bretta Bang III M.D.   On: 01/26/2016 07:24   Dg Chest Port 1 View  Result Date: 01/12/2016 CLINICAL DATA:  Patient with history of CVA.  ET tube placement. EXAM: PORTABLE CHEST 1 VIEW COMPARISON:  Chest radiograph 01/23/2016 FINDINGS: ET tube terminates in the mid trachea. Monitoring leads overlie the patient. Stable cardiomegaly. Moderate layering bilateral pleural effusions with underlying consolidation. Worsening interstitial opacities and vascular redistribution. No pneumothorax. IMPRESSION: ET tube terminates in the mid trachea. Layering moderate bilateral pleural effusions with underlying consolidation favored to represent atelectasis. Worsening pulmonary edema. Electronically Signed   By: Annia Belt M.D.   On: 01/04/2016 18:48   Dg Chest Port 1 View  Result Date: 01/23/2016 CLINICAL DATA:  Pulmonary edema EXAM: PORTABLE CHEST 1 VIEW COMPARISON:  01/12/2016 FINDINGS: Cardiac shadow is stable but mildly enlarged. Aortic calcifications are again seen. Vascular congestion and right-sided pleural effusion are noted. Stable right basilar infiltrate is seen. No bony abnormality is noted. IMPRESSION: Increasing vascular congestion when compare with the prior exam. Stable changes in the right base consistent with effusion and infiltrate. Electronically Signed   By: Alcide Clever M.D.   On: 01/23/2016 17:14   Dg C-arm 1-60 Min  Result Date: 01/20/2016 CLINICAL DATA:  Right anterior hip replacement. EXAM: OPERATIVE RIGHT HIP (WITH PELVIS IF PERFORMED)  VIEWS TECHNIQUE: Fluoroscopic spot image(s) were submitted for interpretation post-operatively. COMPARISON:   None. FINDINGS: Two fluoroscopic spot images are provided showing placement of right hip arthroplasty hardware. Hardware appears appropriately positioned. No evidence of surgical complicating feature appreciated. 18 seconds  of fluoroscopy provided for the exam. IMPRESSION: Intraoperative fluoroscopic spot images showing placement of right hip arthroplasty hardware. 18 seconds of fluoroscopy provided. Electronically Signed   By: Bary RichardStan  Maynard M.D.   On: 01/15/2016 18:38   Dg Hip Operative Unilat W Or W/o Pelvis Right  Result Date: 01/28/2016 CLINICAL DATA:  Right anterior hip replacement. EXAM: OPERATIVE RIGHT HIP (WITH PELVIS IF PERFORMED)  VIEWS TECHNIQUE: Fluoroscopic spot image(s) were submitted for interpretation post-operatively. COMPARISON:  None. FINDINGS: Two fluoroscopic spot images are provided showing placement of right hip arthroplasty hardware. Hardware appears appropriately positioned. No evidence of surgical complicating feature appreciated. 18 seconds of fluoroscopy provided for the exam. IMPRESSION: Intraoperative fluoroscopic spot images showing placement of right hip arthroplasty hardware. 18 seconds of fluoroscopy provided. Electronically Signed   By: Bary RichardStan  Maynard M.D.   On: 02/03/2016 18:38   Dg Hip Unilat W Or Wo Pelvis 2-3 Views Right  Result Date: 01/31/2016 CLINICAL DATA:  [age] year old female status post unwitnessed fall, found down. Right hip pain. Initial encounter. EXAM: DG HIP (WITH OR WITHOUT PELVIS) 2-3V RIGHT COMPARISON:  None. FINDINGS: Right femoral neck fracture with varus impaction. The intertrochanteric region appears spared. The right femoral head remains located within the acetabulum. Pelvis appears intact. SI joints within normal limits. Grossly intact proximal left femur. Dystrophic appearing calcifications adjacent to the left greater trochanter. Iliac artery atherosclerosis that iliac artery calcified atherosclerosis. IMPRESSION: Acute right femoral neck fracture  with varus impaction. Electronically Signed   By: Odessa FlemingH  Hall M.D.   On: 01/18/2016 21:08   Ct Head Code Stroke Wo Contrast`  Result Date: 2016-03-19 CLINICAL DATA:  Code stroke. Right-sided weakness and right facial droop. Hip replacement yesterday EXAM: CT HEAD WITHOUT CONTRAST TECHNIQUE: Contiguous axial images were obtained from the base of the skull through the vertex without intravenous contrast. COMPARISON:  CT head 01/18/2016 FINDINGS: Generalized atrophy. Benign cyst right basal ganglia unchanged. Negative for acute infarct. Negative for intracranial hemorrhage. No mass or edema. No change from the recent CT Atherosclerotic calcification in the carotid and vertebral arteries bilaterally. No acute abnormality in the skull. Small air-fluid level sphenoid sinus. Bilateral lens replacement. ASPECTS [facility]([place] Stroke Program Early CT Score) - Ganglionic level infarction (caudate, lentiform nuclei, internal capsule, insula, M1-M3 cortex): 7 - Supraganglionic infarction (M4-M6 cortex): 3 Total score (0-10 with 10 being normal): 10 IMPRESSION: 1. Negative for acute infarct. 2. ASPECTS is 10 These results were called by telephone at the time of interpretation on 2016-03-19 at 10:37 am to Dr. Hilda BladesArmstrong, who verbally acknowledged these results. Electronically Signed   By: Marlan Palauharles  Clark M.D.   On: 02017-10-15 10:37    Labs:  CBC:  Recent Labs  01/23/16 0553 01/09/2016 0303 Sep 30, 2015 0832 01/26/16 0550  WBC 11.4* 15.0* 8.6 9.7  HGB 14.3 14.1 12.6 11.6*  HCT 44.5 43.8 39.7 36.5  PLT 146* 115* 93* 113*    COAGS:  Recent Labs  04/16/15 1802 04/17/15 0244 Sep 30, 2015 1125  INR 1.37 1.40 1.34    BMP:  Recent Labs  01/23/16 0553 02/01/2016 0303 Sep 30, 2015 1125 01/26/16 0550  NA 140 138 135 138  K 3.9 3.5 3.7 3.7  CL 102 105 103 105  CO2 25 25 26 26   GLUCOSE 162* 144* 112* 102*  BUN 20 27* 37* 37*  CALCIUM 9.2 8.9 8.4* 8.5*  CREATININE 1.13* 1.29* 1.20* 1.09*  GFRNONAA 39* 34* 37* 41*  GFRAA  46* 39* 42* 48*    LIVER FUNCTION TESTS:  Recent Labs  04/16/15  1802 04/17/15 0244 01/23/16 0553 01/07/2016 0303  BILITOT 1.3* 1.6* 1.2 1.2  AST 25 27 25 21   ALT 20 18 18 16   ALKPHOS 108 95 104 79  PROT 6.5 6.2* 6.1* 5.8*  ALBUMIN 3.9 3.7 3.4* 3.1*    Assessment and Plan: 1. S/p S/P Lt Common carotid arteriogram,follwed by complete revascularization of occluded LT MCA  -patient sedated on vent. -will plan to pull her sheath today. -will follow, further plans per neuro and primary service  Electronically Signed: Jafari Mckillop E 01/26/2016, 9:31 AM   I spent a total of 15 Minutes at the the patient's bedside AND on the patient's hospital floor or unit, greater than 50% of which was counseling/coordinating care for MCA CVA

## 2016-01-26 NOTE — Progress Notes (Addendum)
Patient Name: Jacqueline [name] Date of Encounter: 01/26/2016  Active Problems:   Essential hypertension   CAD (coronary artery disease)   Chronic atrial fibrillation (HCC)   Pleural effusion   Hypoxia   Acute on chronic diastolic (congestive) heart failure (HCC)   Moderate pulmonary hypertension ([place])   Closed displaced fracture of right femoral neck (HCC)   Closed right hip fracture (HCC)   Atrial fibrillation with RVR ([place])   Stroke ([place])   Length of Stay: 4  SUBJECTIVE  The patient underwent hip surgery on 8/21 without any complications, however developed a large embolic stroke yesterday, with right hemiplegia, CTA showed occlusion of left MCA, she underwent conservative angiogram with complete revascularization, she was intubated and remains intubated, currently neuro ICU. She is showing signs of neurologic recovery. Not on anticoagulation.  CURRENT MEDS .  stroke: mapping our early stages of recovery book   Does not apply Once  . antiseptic oral rinse  7 mL Mouth Rinse 10 times per day  . carvedilol  12.5 mg Oral BID WC  . chlorhexidine gluconate (SAGE KIT)  15 mL Mouth Rinse BID  . enoxaparin (LOVENOX) injection  30 mg Subcutaneous Q24H  . irbesartan  150 mg Oral Daily  . levothyroxine  100 mcg Oral QAC breakfast  . pantoprazole sodium  40 mg Per Tube Daily  . senna  1 tablet Oral BID  . sodium chloride flush  3 mL Intravenous Q12H   . sodium chloride 30 mL/hr at 01/26/16 0900  . niCARDipine Stopped (01/10/2016 1545)  . propofol (DIPRIVAN) infusion 10 mcg/kg/min (01/26/16 0955)   OBJECTIVE  Vitals:   01/26/16 0700 01/26/16 0722 01/26/16 0800 01/26/16 0900  BP: (!) 115/58 137/73 138/65 (!) 123/59  Pulse: 79 97 90 75  Resp: 15 (!) 26 (!) 9 15  Temp:   97.7 F (36.5 C)   TempSrc:   Axillary   SpO2: 99% 100% 98% 98%  Weight:      Height:        Intake/Output Summary (Last 24 hours) at 01/26/16 1030 Last data filed at 01/26/16 0900  Gross per 24 hour  Intake           1047.18 ml  Output             1105 ml  Net           -57.82 ml   Filed Weights   01/04/2016 0413 01/05/2016 1400 01/26/16 0500  Weight: 169 lb 12.1 oz (77 kg) 177 lb 7.5 oz (80.5 kg) 176 lb 9.4 oz (80.1 kg)    PHYSICAL EXAM  General:Intubated sedated  Neuro: Moves her arms with some weakness on the right side HEENT:  Normal  Neck: Supple without bruits, JVD is +10 cm bilaterally. Lungs:  Resp regular and unlabored, crackles bilaterally. Heart: iRRR no s3, s4, or murmurs. Abdomen: Soft, non-tender, non-distended, BS + x 4.  Extremities: No clubbing, cyanosis or edema. Lateral distortion of her right LLE  Accessory Clinical Findings  CBC  Recent Labs  01/06/2016 0832 01/26/16 0550  WBC 8.6 9.7  HGB 12.6 11.6*  HCT 39.7 36.5  MCV 95.0 93.6  PLT 93* 767*   Basic Metabolic Panel  Recent Labs  01/28/2016 0710 01/29/2016 1125 01/26/16 0550  NA  --  135 138  K  --  3.7 3.7  CL  --  103 105  CO2  --  26 26  GLUCOSE  --  112* 102*  BUN  --  37* 37*  CREATININE  --  1.20* 1.09*  CALCIUM  --  8.4* 8.5*  MG 2.1  --  2.2  PHOS  --   --  3.2   Liver Function Tests  Recent Labs  01/11/2016 0303  AST 21  ALT 16  ALKPHOS 79  BILITOT 1.2  PROT 5.8*  ALBUMIN 3.1*   Radiology/Studies  Dg Chest 2 View  Result Date: 01/18/2016 CLINICAL DATA:  Fall.  Right hip pain.  No reported back pain. EXAM: CHEST  2 VIEW COMPARISON:  04/17/2015 FINDINGS: Bilateral pleural effusions also seen on prior exam. Mild pulmonary venous congestion. No convincing consolidation. No pneumothorax. Chronic cardiomegaly. Mediastinal contours are distorted by rotation. No visible fracture. Exaggerated thoracic kyphosis. Cannot exclude lower thoracic compression deformities, limited by overlapping shadows, but no reported back pain. IMPRESSION: Chronic bilateral pleural effusion, borderline moderate on the right. Pulmonary venous congestion. Electronically Signed   By: Monte Fantasia M.D.   On:  01/27/2016 21:10   Dg Chest Port 1 View  Result Date: 01/23/2016 CLINICAL DATA:  Pulmonary edema EXAM: PORTABLE CHEST 1 VIEW COMPARISON:  01/12/2016 FINDINGS: Cardiac shadow is stable but mildly enlarged. Aortic calcifications are again seen. Vascular congestion and right-sided pleural effusion are noted. Stable right basilar infiltrate is seen. No bony abnormality is noted. IMPRESSION: Increasing vascular congestion when compare with the prior exam. Stable changes in the right base consistent with effusion and infiltrate. Electronically Signed   By: Inez Catalina M.D.   On: 01/23/2016 17:14   Dg Hip Unilat W Or Wo Pelvis 2-3 Views Right  Result Date: 01/20/2016 CLINICAL DATA:  82 year old female status post unwitnessed fall, found down. Right hip pain. Initial encounter. EXAM: DG HIP (WITH OR WITHOUT PELVIS) 2-3V RIGHT COMPARISON:  None. FINDINGS: Right femoral neck fracture with varus impaction. The intertrochanteric region appears spared. The right femoral head remains located within the acetabulum. Pelvis appears intact. SI joints within normal limits. Grossly intact proximal left femur. Dystrophic appearing calcifications adjacent to the left greater trochanter. Iliac artery atherosclerosis that iliac artery calcified atherosclerosis. IMPRESSION: Acute right femoral neck fracture with varus impaction. Electronically Signed   By: Genevie Ann M.D.   On: 01/29/2016 21:08    TELE: a-fib rate controlled    ASSESSMENT AND PLAN  82 year old female with right hip fracture  1. Permanent atrial fibrillation, rate controlled chads2vasc score is 6, this is very tough situation, patient is 82 years old and has had falls that led to decision not to anticoagulate despite high embolic risk. She had a large embolic stroke yesterday with complete revascularization, however considering her fall risk and recent surgery the decision is still not to anticoagulate. She is currently rate controlled on carvedilol given  via feeding tube. Ventricular rates in 70s.  2. Acute on chronic diastolic CHF -  The patient is clearly fluid overloaded, she has +7 pounds in the last 48 hours,  I would start Lasix 40 IV every 12 hour, her potassium is 3.7 creatinine is improved at 1.0.  We will follow  3. CAD No ischemic symptoms No further workup as above Resume ASA when able Perioperative beta blocker therapy  4. HTN Controlled  5. Mild AS No further workup or management  Signed, Ena Dawley MD, [facility] 01/26/2016

## 2016-01-26 NOTE — Progress Notes (Addendum)
Initial Nutrition Assessment  DOCUMENTATION CODES:   Obesity unspecified  INTERVENTION:    If TF started, recommend Vital High Protein at goal rate of 30 ml/h (720 ml per day) and Prostat 30 ml TID   TF regimen + Propofol infusion to provide 1146 total kcals, 108 gm protein, 602 ml of free water  NUTRITION DIAGNOSIS:   Inadequate oral intake related to inability to eat as evidenced by NPO status  GOAL:   Provide needs based on ASPEN/SCCM guidelines  MONITOR:   Vent status, Labs, Weight trends, Skin, I & O's  REASON FOR ASSESSMENT:   Ventilator  ASSESSMENT:   82 yo Female with PMH of DM, chronic AF not on anticoagulation secondary to chronic falls (off eliquis 6 months ago), CAD, AS, Diastolic HF, HTN, HLD, hypothyroidism admitted 8/19 after a mechanical fall.  Patient s/p procedure 8/22: CEREBRAL ANGIOGRAM  Patient is currently intubated on ventilator support Temp (24hrs), Avg:98.4 F (36.9 C), Min:97.7 F (36.5 C), Max:99.1 F (37.3 C)  Propofol: 4.8 ml/hr >>> 126 fat kcals   [facility]. PTA medications and current labs reviewed. CCM note reviewed >> pt will likely need further diuresis before extubation attempt. Unable to complete Nutrition Focused Physical Exam at this time.  Diet Order:  Diet NPO time specified  Skin:  Reviewed, no issues  Last BM:  8/20  Height:   Ht Readings from Last 1 Encounters:  May 25, 2016 5\' 4"  (1.626 m)    Weight:   Wt Readings from Last 1 Encounters:  01/26/16 176 lb 9.4 oz (80.1 kg)    Ideal Body Weight:  54.5 kg  BMI:  Body mass index is 30.31 kg/m.  Estimated Nutritional Needs:   Kcal:  [phone]  Protein:  105-115 gm  Fluid:  per MD  EDUCATION NEEDS:   No education needs identified at this time  Maureen ChattersKatie Mickie Kozikowski, RD, LDN Pager #: [phone] After-Hours Pager #: [phone]

## 2016-01-26 NOTE — Care Management Important Message (Signed)
Important Message  Patient Details  Name: Jacqueline GableMary E [name] MRN: [id number] Date of Birth: 07/11/1916   Medicare Important Message Given:  Other (see comment)    Heylee Tant Abena 01/26/2016, 11:22 AM

## 2016-01-26 NOTE — Progress Notes (Signed)
SLP Cancellation Note  Patient Details Name: Norval GableMary E Noyola MRN: [id number] DOB: 12/09/1916   Cancelled treatment:        Intubated. RN states extubation around 14:00. Will attempt speech-language-cognitive assessment as ordered later today if schedule allows.    Royce MacadamiaLitaker, Manahil Vanzile Willis 01/26/2016, 9:54 AM   Breck CoonsLisa Willis Lonell FaceLitaker M.Ed [employer] Pager [phone]

## 2016-01-26 NOTE — Progress Notes (Signed)
Interventional Radiology here at bedside to remove right 9Fr sheath.  Pt id'ed by name and DOB.  Sheath pulled at 1014, pressure held until  hemostasis achieved.  V-Pad in place along with tegaderm and pressure dressing.  Junious Dresseronnie RN here at bedside.  Distal pulses DP +2. PT non palpable.  No immediate complications noted.  jkc/mja

## 2016-01-27 ENCOUNTER — Inpatient Hospital Stay (HOSPITAL_COMMUNITY): Payer: Medicare Other

## 2016-01-27 DIAGNOSIS — E876 Hypokalemia: Secondary | ICD-10-CM

## 2016-01-27 LAB — CBC WITH DIFFERENTIAL/PLATELET
Basophils Absolute: 0 10*3/uL (ref 0.0–0.1)
Basophils Relative: 0 %
Eosinophils Absolute: 0 10*3/uL (ref 0.0–0.7)
Eosinophils Relative: 0 %
HEMATOCRIT: 36 % (ref 36.0–46.0)
HEMOGLOBIN: 11.3 g/dL — AB (ref 12.0–15.0)
LYMPHS ABS: 0.5 10*3/uL — AB (ref 0.7–4.0)
LYMPHS PCT: 6 %
MCH: 29.6 pg (ref 26.0–34.0)
MCHC: 31.4 g/dL (ref 30.0–36.0)
MCV: 94.2 fL (ref 78.0–100.0)
MONO ABS: 0.8 10*3/uL (ref 0.1–1.0)
MONOS PCT: 9 %
NEUTROS ABS: 7.4 10*3/uL (ref 1.7–7.7)
Neutrophils Relative %: 85 %
Platelets: 127 10*3/uL — ABNORMAL LOW (ref 150–400)
RBC: 3.82 MIL/uL — ABNORMAL LOW (ref 3.87–5.11)
RDW: 14.1 % (ref 11.5–15.5)
WBC: 8.8 10*3/uL (ref 4.0–10.5)

## 2016-01-27 LAB — MAGNESIUM: Magnesium: 2.1 mg/dL (ref 1.7–2.4)

## 2016-01-27 LAB — GLUCOSE, CAPILLARY
GLUCOSE-CAPILLARY: 86 mg/dL (ref 65–99)
GLUCOSE-CAPILLARY: 103 mg/dL — AB (ref 65–99)
GLUCOSE-CAPILLARY: 118 mg/dL — AB (ref 65–99)
Glucose-Capillary: 94 mg/dL (ref 65–99)
Glucose-Capillary: 104 mg/dL — ABNORMAL HIGH (ref 65–99)
Glucose-Capillary: 128 mg/dL — ABNORMAL HIGH (ref 65–99)

## 2016-01-27 LAB — RENAL FUNCTION PANEL
ALBUMIN: 2.4 g/dL — AB (ref 3.5–5.0)
Anion gap: 6 (ref 5–15)
BUN: 39 mg/dL — AB (ref 6–20)
CHLORIDE: 107 mmol/L (ref 101–111)
CO2: 26 mmol/L (ref 22–32)
Calcium: 8.1 mg/dL — ABNORMAL LOW (ref 8.9–10.3)
Creatinine, Ser: 0.89 mg/dL (ref 0.44–1.00)
GFR calc Af Amer: >60 mL/min (ref >60)
GFR calc non Af Amer: 53 mL/min — ABNORMAL LOW (ref >60)
GLUCOSE: 89 mg/dL (ref 65–99)
POTASSIUM: 3.3 mmol/L — AB (ref 3.5–5.1)
Phosphorus: 2.8 mg/dL (ref 2.5–4.6)
Sodium: 139 mmol/L (ref 135–145)

## 2016-01-27 LAB — HEMOGLOBIN A1C
HEMOGLOBIN A1C: 5.7 % — AB (ref 4.8–5.6)
Mean Plasma Glucose: 117 mg/dL

## 2016-01-27 MED ORDER — LEVOTHYROXINE SODIUM 100 MCG IV SOLR
50.0000 ug | Freq: Every day | INTRAVENOUS | Status: DC
Start: 2016-01-27 — End: 2016-01-30
  Administered 2016-01-27 – 2016-01-28 (×2): 50 ug via INTRAVENOUS
  Filled 2016-01-27 (×2): qty 5

## 2016-01-27 MED ORDER — POTASSIUM CHLORIDE 10 MEQ/100ML IV SOLN
10.0000 meq | INTRAVENOUS | Status: AC
  Administered 2016-01-27 (×4): 10 meq via INTRAVENOUS
  Filled 2016-01-27 (×4): qty 100

## 2016-01-27 MED ORDER — FAMOTIDINE IN NACL 20-0.9 MG/50ML-% IV SOLN
20.0000 mg | INTRAVENOUS | Status: DC
  Administered 2016-01-27 – 2016-01-28 (×2): 20 mg via INTRAVENOUS
  Filled 2016-01-27 (×2): qty 50

## 2016-01-27 MED ORDER — FUROSEMIDE 10 MG/ML IJ SOLN: 40.0000 mg | Freq: Four times a day (QID) | INTRAMUSCULAR | Status: DC

## 2016-01-27 MED ORDER — FUROSEMIDE 10 MG/ML IJ SOLN
40.0000 mg | Freq: Four times a day (QID) | INTRAMUSCULAR | Status: DC
  Administered 2016-01-27 – 2016-01-30 (×11): 40 mg via INTRAVENOUS
  Filled 2016-01-27 (×12): qty 4

## 2016-01-27 MED ORDER — DEXMEDETOMIDINE HCL IN NACL 200 MCG/50ML IV SOLN
0.4000 ug/kg/h | INTRAVENOUS | Status: DC
  Administered 2016-01-27: 0.4 ug/kg/h via INTRAVENOUS
  Administered 2016-01-27: 0.5 ug/kg/h via INTRAVENOUS
  Administered 2016-01-27: 0.6 ug/kg/h via INTRAVENOUS
  Administered 2016-01-28: 0.5 ug/kg/h via INTRAVENOUS
  Administered 2016-01-28: 0.4 ug/kg/h via INTRAVENOUS
  Administered 2016-01-28: 0.6 ug/kg/h via INTRAVENOUS
  Administered 2016-01-28: 0.4 ug/kg/h via INTRAVENOUS
  Administered 2016-01-29 (×2): 0.5 ug/kg/h via INTRAVENOUS
  Filled 2016-01-27 (×12): qty 50

## 2016-01-27 NOTE — Progress Notes (Signed)
PT Cancellation Note  Patient Details Name: Norval GableMary E Guderian MRN: [id number] DOB: 11/07/1916   Cancelled Treatment:    Reason Eval/Treat Not Completed: Medical issues which prohibited therapy   Lurena Joinerebecca B. Taylen Wendland, PT, DPT [phone]   01/27/2016, 11:20 AM

## 2016-01-27 NOTE — Progress Notes (Signed)
PULMONARY / CRITICAL CARE MEDICINE   Name: Jacqueline [name] MRN: [id number] DOB: [birth year]    ADMISSION DATE:  03-Feb-2016 CONSULTATION DATE:  01/08/2016  REFERRING MD:  Dr. Corliss Skains  CHIEF COMPLAINT: Left M-1 segment occlussion  HISTORY OF PRESENT ILLNESS:  82 year old female with PMH of DM, chronic AF not on anticoagulation secondary to chronic falls (off eliquis 6 months ago), CAD, AS, Diastolic HF, HTN, HLD, hypothyroidism admitted 8/19 after a mechanical fall..  She is a resident at assisted living and is fairly active.  Unfortunately she suffered a mechanical fall on 8/19 and fractured her right femoral neck.  Pre-op she was seen by Cardiology for clearance and given high risk given PMH and age.  Surgery was postponed secondary to development of pulmonary edema and hypoxia on the 8/20 which improved with lasix and oxygen.  The patient underwent a right hemi-arthroplasty on the evening of 8/21 and was extubated without complications. She was placed on lovenox for DVT coverage.  On the morning of 8/22 she was LKW around 0830, then later found with left gaze, right hemiplegia, right facial droop, right hemianopia, and NIHSS of 28.  CTA showed occlusion of left M-1 segment of MCA.  She was intubated and underwent cerebral angiogram with complete revascularization.  The patient remained intubated post-procedure and transferred to Neuro ICU.  PCCM consulted for further ventilator and ICU management.    SUBJECTIVE:  No acute events. Patient still has not had MRI.  REVIEW OF SYSTEMS:  Unable to be assess as patient is intubated.   VITAL SIGNS: BP (!) 161/91   Pulse 83   Temp 98.1 F (36.7 C) (Axillary)   Resp (!) 25   Ht 5\' 4"  (1.626 m)   Wt 181 lb (82.1 kg)   LMP  (LMP Unknown)   SpO2 100%   BMI 31.07 kg/m   HEMODYNAMICS:    VENTILATOR SETTINGS: Vent Mode: PSV;CPAP FiO2 (%):  [40 %] 40 % Set Rate:  [14 bmp] 14 bmp Vt Set:  [440 mL] 440 mL PEEP:  [5 cmH20] 5 cmH20 Pressure Support:   [10 cmH20] 10 cmH20 Plateau Pressure:  [14 cmH20-16 cmH20] 15 cmH20  INTAKE / OUTPUT: I/O last 3 completed shifts: In: 1297.5 [I.V.:1297.5] Out: 1955 [Urine:1955]  PHYSICAL EXAMINATION: General:  Elderly female. No distress. Daughter at bedside.  Neuro: Blinks eyes but doesn't follow commands. Moving RUE.  HEENT:  Endotracheal tube in place. No scleral icterus. Moist mucous membranes.  Cardiovascular:  Regular rate with a regular rhythm. No appreciable JVD. No edema.  Lungs:  Coarse breath sounds bilaterally unchanged. Symmetric chest wall rise on ventilator.  Abdomen:  soft. Nondistended. Normal bowel sounds.  Musculoskeletal:  No acute deformities.  Dressing to right anterior hip clean, dry, and intact.  Skin:  Warm and dry. No rash on exposed skin.   LABS:  BMET  Recent Labs Lab 01/06/2016 1125 01/26/16 0550 01/27/16 0237  NA 135 138 139  K 3.7 3.7 3.3*  CL 103 105 107  CO2 26 26 26   BUN 37* 37* 39*  CREATININE 1.20* 1.09* 0.89  GLUCOSE 112* 102* 89    Electrolytes  Recent Labs Lab 01/23/16 0553  01/19/2016 0710 02/02/2016 1125 01/26/16 0550 01/27/16 0237  CALCIUM 9.2  < >  --  8.4* 8.5* 8.1*  MG 2.1  --  2.1  --  2.2 2.1  PHOS 4.2  --   --   --  3.2 2.8  < > = values in this  interval not displayed.  CBC  Recent Labs Lab 01/08/2016 0832 01/26/16 0550 01/27/16 0237  WBC 8.6 9.7 8.8  HGB 12.6 11.6* 11.3*  HCT 39.7 36.5 36.0  PLT 93* 113* 127*    Coag's  Recent Labs Lab 01/04/2016 1125  INR 1.34    Sepsis Markers No results for input(s): LATICACIDVEN, PROCALCITON, O2SATVEN in the last 168 hours.  ABG  Recent Labs Lab 01/11/2016 1653 01/26/16 0230  PHART 7.375 7.385  PCO2ART 44.5 44.8  PO2ART 79.0* 111*    Liver Enzymes  Recent Labs Lab 01/23/16 0553 01/15/2016 0303 01/27/16 0237  AST 25 21  --   ALT 18 16  --   ALKPHOS 104 79  --   BILITOT 1.2 1.2  --   ALBUMIN 3.4* 3.1* 2.4*    Cardiac Enzymes  Recent Labs Lab 01/31/2016 2301  01/23/16 0553  TROPONINI 0.03* 0.07*    Glucose  Recent Labs Lab 01/11/2016 0954 01/26/16 1609 01/26/16 2002 01/26/16 2324 01/27/16 0338 01/27/16 0816  GLUCAP 112* 86 93 96 86 103*    Imaging No results found.   STUDIES:  CT C-Spine 8/19: no acute findings; mild degenerative changes CT Head 8/19: atrophy and chronic small vessel ischemic change; no acute/traumatic finding TTE 8/21: mod LVH; severe focal basal hypertrophy of the septum; LVEF 60-65%; normal wall motion, no regional wall motion abnormalities; mild AS with trivial AR; MV with calcified annulus and mildly thickened leaflets, LA mod dilated, RA severely dilated; mod TR, PAS 53 mmHg CTA Head/Neck 8/22: large occlusion of left M1 segment,  Severe atherosclerotic stenosis of right ICA siphon, left ACA A2 segment, and left PCA with poor branch enhancement; moderate atherosclerotic stenosis of the non dominant distal right vertebral artery and basilar artery; high grade stenosis of RCA origin; large right and mod to large left layering pleural efusions CT Head 8/22 (post intervention): suspected small left MCA territory nonhemorrhagic infarct; stable involutional changes and mild chronic small vessel ischemic disease Port CXR 8/23: Bilateral lower lung opacities. Endotracheal tube in good position. MRI 8/24 >>   MICROBIOLOGY: MRSA PCR 8/20:  Negative   ANTIBIOTICS: Ancef 8/21 Periop PPx  SIGNIFICANT EVENTS: 8/19 -  Admit after fall with R fem neck fx 8/20 -  Pulmonary edema, hypoxia that improved with lasix / O2 8/21 -  OR for R anterior ORIF 8/22 -  L M1 CVA, to neuro IR for revascularization   LINES/TUBES: R fem sheath 8/22 - 8/23 OETT 7.5 8/22 >> L Radial Art Line 8/22 >> Foley >> PIV x2  ASSESSMENT / PLAN:  NEUROLOGIC A:   L M-1 CVA - S/P Revascularization 8/22. Sedation on Ventilator  P:   RASS goal: -1 Starting Precedex gtt Propofol gtt Fentanyl IV prn MRI pending  PULMONARY A: Acute Hypoxic  Respiratory Failure - Multifactorial w/ acute CVA, AMS, & Probable Pulmonary Edema. Bilateral Pleural Effusions  P:   Full Vent Support Wean PEEP / FiO2 for sats > 92% Daily SBT Diuresis w/ Lasix IV continuing KCl 10mEq IV x4 runs  CARDIOVASCULAR A:  PAF - Not on anticoagulation due to increased fall risk  HTN H/O MildAortic Stenosis H/O HLD H/O Diastolic Dysfunction  H/O CAD  P:  Cardiology Following ICU monitoring of hemodynamics  BP parameters per Neurology Cardene gtt as needed Holding Coreg 12.5mg  bid & Avapro 150mg  daily Lopressor IV prn Hydralazine IV prn Lasix IV q12hr Plan to resume ASA when ok with Neuro IR  RENAL A:   Hypokalemia - Replacing.  P:   Trending UOP with Foley Monitoring electrolytes & renal function daily Replacing electrolytes as indicated KCl 10mEq IV x4 runs  GASTROINTESTINAL A:   Constipation   P:   NPO  Pepcid IV daily Holding Senna & Colace  HEMATOLOGIC A:   Thrombocytopenia - Mild. Leukocytosis - Resolved.  P:  Trending cell counts daily w/ CBC SCDs Lovenox 30mg  [place] daily  INFECTIOUS A:   No acute infection.  P:   Monitor surgical site  Trend fever / WBC curve  ENDOCRINE A:   H/O DM - Glucose controlled. A1c 5.6. H/O Hypothyroidism   P:   Accu-Checks q4hr SSI per Sensitive Algorithm Holding Synthroid for now  ORTHOPEDIC A: S/P R Hip Arthroplasty  P: PT/OT Consulted Likely will need SNF post discharge  FAMILY  - Updates: Daughter updated at bedside 8/23.   - Inter-disciplinary family meet or Palliative Care meeting due by:  8/29  TODAY'S SUMMARY:  82 y.o. female with PMH of Afib (not on AC 2/2 to frequent falls), DM, Diastolic HF, AS, and HTN  admitted 8/19 after mechanical fall sustaining R neck femoral neck fracture.  Developed pulmonary edema 8/20 and hypoxia thought secondary to IVF improved after lasix which delayed surgery. Taken to the OR on 8/21 for right hip hemiarthroplasty.  On 8/22 found  with right hemiplegia, hemianopia, facial droop, and left gaze with NIHSS 28.  Taken to neuro IR for revascularization & stable post procedure. Patient planned for MRI today. Transitioning off of Propofol to Precedex with anticipated extubation post MRI today.  I have spent a total of 32 minutes of critical care time today caring for the patient, updating daughter at bedside, and reviewing the patient's electronic medical record.   Donna ChristenJennings E. Jamison NeighborNestor, M.D. [facility] Pager:  [phone] After 3pm or if no response, call [phone]\ 01/27/2016, 11:21 AM

## 2016-01-27 NOTE — Progress Notes (Signed)
STROKE TEAM PROGRESS NOTE   SUBJECTIVE (INTERVAL HISTORY) Her daughter is at the bedside.  RN is also at bedside. Per RN, was trying to pull tube with left arm. R arm moves against gravity. Does not follow commands. Bilateral LE withdraws to pain.   OBJECTIVE Temp:  [97.6 F (36.4 C)-99.1 F (37.3 C)] 98.1 F (36.7 C) (08/24 0800) Pulse Rate:  [70-100] 85 (08/24 0800) Cardiac Rhythm: Normal sinus rhythm (08/24 0500) Resp:  [11-25] 11 (08/24 0800) BP: (99-177)/(45-105) 165/74 (08/24 0800) SpO2:  [97 %-100 %] 100 % (08/24 0800) Arterial Line BP: (68-162)/(45-132) 96/84 (08/24 0800) FiO2 (%):  [40 %] 40 % (08/24 0800) Weight:  [82.1 kg (181 lb)] 82.1 kg (181 lb) (08/24 0342)  CBC:  Recent Labs Lab Aug 07, 2015 2129  01/26/16 0550 01/27/16 0237  WBC 13.0*  < > 9.7 8.8  NEUTROABS 11.6*  --   --  7.4  HGB 14.4  < > 11.6* 11.3*  HCT 43.4  < > 36.5 36.0  MCV 92.1  < > 93.6 94.2  PLT 137*  < > 113* 127*  < > = values in this interval not displayed.  Basic Metabolic Panel:   Recent Labs Lab 01/26/16 0550 01/27/16 0237  NA 138 139  K 3.7 3.3*  CL 105 107  CO2 26 26  GLUCOSE 102* 89  BUN 37* 39*  CREATININE 1.09* 0.89  CALCIUM 8.5* 8.1*  MG 2.2 2.1  PHOS 3.2 2.8    Lipid Panel:     Component Value Date/Time   CHOL 115 01/26/2016 0550   TRIG 84 01/26/2016 0550   HDL 25 (L) 01/26/2016 0550   CHOLHDL 4.6 01/26/2016 0550   VLDL 17 01/26/2016 0550   LDLCALC 73 01/26/2016 0550   HgbA1c:  Lab Results  Component Value Date   HGBA1C 5.7 (H) 01/26/2016     IMAGING  Ct Angio Head W Or Wo Contrast  Result Date: 01/17/2016 CLINICAL DATA:  82 year old female code stroke with right side weakness. Initial encounter. EXAM: CT ANGIOGRAPHY HEAD AND NECK TECHNIQUE: Multidetector CT imaging of the head and neck was performed using the standard protocol during bolus administration of intravenous contrast. Multiplanar CT image reconstructions and MIPs were obtained to evaluate the  vascular anatomy. Carotid stenosis measurements (when applicable) are obtained utilizing NASCET criteria, using the distal internal carotid diameter as the denominator. CONTRAST:  50 mL Isovue 370 COMPARISON:  Head CT without contrast 1025 hours today. FINDINGS: CTA NECK Skeleton: Degenerative changes in the spine. No acute osseous abnormality identified. Visualized paranasal sinuses and mastoids are stable and well pneumatized. Other neck: Moderate to large layering right pleural effusion. Smaller but at least moderate layering left pleural effusion. Fluid may be tracking in the superior aspect of the left major fissure. Moderate to large multinodular thyroid goiter. No superior mediastinal lymphadenopathy. Negative larynx, pharynx, parapharyngeal spaces, retropharyngeal space, sublingual space, submandibular glands and parotid glands. Asymmetric and rounded 8 mm right level 1 B lymph node. Series 6, image 101). Elsewhere cervical lymph nodes appear normal. Aortic arch: 3 vessel arch configuration. Moderate soft and calcified arch atherosclerosis. Right carotid system: No brachiocephalic artery stenosis. Tortuous right CCA origin with a kinked appearance, but otherwise no stenosis. Mild for age right carotid atherosclerosis in the neck without stenosis. Mildly tortuous distal cervical right ICA. Left carotid system: No left CCA origin stenosis. Tortuous left CCA. Confluent calcified plaque at the left carotid bifurcation involving the left CCA origin and bulb, but stenosis is less than  50 % with respect to the distal vessel. Tortuous cervical left CCA at the level of the pharynx. Vertebral arteries:Tortuous and atherosclerotic right subclavian artery origin with stenosis up to 75 % with respect to the distal vessel. Normal right vertebral artery origin. Negative right vertebral artery to the skullbase. No proximal left subclavian artery stenosis despite calcified plaque. Normal left vertebral artery origin. The  left vertebral is mildly dominant. There is minimal calcified plaque but no stenosis along its course to the neck. CTA HEAD Posterior circulation: Dominant distal left vertebral artery with V4 segment calcified plaque resulting in mild stenosis. The non dominant right vertebral artery is more moderately irregular and stenotic in the V4 segment, but remains patent to the vertebrobasilar junction. Mild basilar artery irregularity and stenosis. SCA and right PCA origins remain patent. Diminutive right posterior communicating artery. The left PCA origin is severely stenotic. There is poor flow in the left PCA branches. Anterior circulation: Both ICA siphons remain patent. There is moderate calcified plaque in both ICA siphons, with severe stenosis at the distal right cavernous segment, and moderate stenosis in the right supraclinoid segment. On the left stenosis is less than 50%. Both carotid termini are patent, but the right ACA A1 segment is non dominant and diminutive. Normal left ACA A1, anterior communicating artery. Moderate to severe left ACA A2 segment stenosis proximal to the left pericallosal artery. Bilateral ACA branches however remain patent. Right MCA M1 segment, bifurcation, and right MCA branches are within normal limits. There is left MCA occlusion in the mid M1 segment there is an intermediate level of collateral flow in the left MCA branches (series 11, image 18) Venous sinuses: Patent. Anatomic variants: Dominant left vertebral artery and right A1. Delayed phase: There remains no acute cortically based infarct evident in the left hemisphere. Stable gray-white matter differentiation throughout the brain. No acute intracranial hemorrhage identified. No midline shift, mass effect, or evidence of intracranial mass lesion. IMPRESSION: 1. Positive for emergent large vessel occlusion at the left M1 segment. This was discussed with Dr. Hilda BladesArmstrong at 1054 hours. Intermediate level collateral enhancement in the  left MCA branches. 2. Stable CT appearance of the brain from the initial scan this morning. No acute left MCA ischemia evident by CT. 3. Superimposed severe atherosclerotic stenoses of the right ICA siphon (anterior genu), left ACA A2 segment, and left PCA (with poor left PCA branch enhancement). 4. Up to moderate atherosclerotic stenosis of the non dominant distal right vertebral artery, and basilar artery. 5. High-grade stenosis of the right subclavian artery origin (75%). 6. Large right and moderate to large left layering pleural effusions. Electronically Signed   By: Odessa FlemingH  Hall M.D.   On: 02/02/2016 11:47   Ct Head Wo Contrast  Result Date: 01/18/2016 CLINICAL DATA:  Follow-up code stroke. Status post revascularization of occluded LEFT M1 segment. EXAM: CT HEAD WITHOUT CONTRAST TECHNIQUE: Contiguous axial images were obtained from the base of the skull through the vertex without intravenous contrast. COMPARISON:  CT HEAD January 25, 2016 at 10:25 a.m. FINDINGS: BRAIN: The ventricles and sulci are normal for age. No intraparenchymal hemorrhage, mass effect nor midline shift. Patchy supratentorial white matter hypodensities less than expected for patient's age, though non-specific are most compatible with chronic small vessel ischemic disease. Slight blurring of the posterior insula gray-white matter differentiation. No abnormal extra-axial fluid collections. Basal cisterns are patent. VASCULAR: Moderate calcific atherosclerosis of the carotid siphons. Mild residual contrast opacification of the intracranial vessels. SKULL: No skull fracture. No significant  scalp soft tissue swelling. Moderate temporomandibular osteoarthrosis. SINUSES/ORBITS: The included ocular globes and orbital contents are non-suspicious. Status post bilateral ocular lens implants. The mastoid air-cells and included paranasal sinuses are well-aerated. OTHER: None. IMPRESSION: Suspected small LEFT MCA territory nonhemorrhagic infarct. Stable  involutional changes and mild chronic small vessel ischemic disease. Electronically Signed   By: Awilda Metro M.D.   On: 01/28/2016 13:41   Ct Angio Neck W Or Wo Contrast  Result Date: 01/23/2016 CLINICAL DATA:  82 year old female code stroke with right side weakness. Initial encounter. EXAM: CT ANGIOGRAPHY HEAD AND NECK TECHNIQUE: Multidetector CT imaging of the head and neck was performed using the standard protocol during bolus administration of intravenous contrast. Multiplanar CT image reconstructions and MIPs were obtained to evaluate the vascular anatomy. Carotid stenosis measurements (when applicable) are obtained utilizing NASCET criteria, using the distal internal carotid diameter as the denominator. CONTRAST:  50 mL Isovue 370 COMPARISON:  Head CT without contrast 1025 hours today. FINDINGS: CTA NECK Skeleton: Degenerative changes in the spine. No acute osseous abnormality identified. Visualized paranasal sinuses and mastoids are stable and well pneumatized. Other neck: Moderate to large layering right pleural effusion. Smaller but at least moderate layering left pleural effusion. Fluid may be tracking in the superior aspect of the left major fissure. Moderate to large multinodular thyroid goiter. No superior mediastinal lymphadenopathy. Negative larynx, pharynx, parapharyngeal spaces, retropharyngeal space, sublingual space, submandibular glands and parotid glands. Asymmetric and rounded 8 mm right level 1 B lymph node. Series 6, image 101). Elsewhere cervical lymph nodes appear normal. Aortic arch: 3 vessel arch configuration. Moderate soft and calcified arch atherosclerosis. Right carotid system: No brachiocephalic artery stenosis. Tortuous right CCA origin with a kinked appearance, but otherwise no stenosis. Mild for age right carotid atherosclerosis in the neck without stenosis. Mildly tortuous distal cervical right ICA. Left carotid system: No left CCA origin stenosis. Tortuous left CCA.  Confluent calcified plaque at the left carotid bifurcation involving the left CCA origin and bulb, but stenosis is less than 50 % with respect to the distal vessel. Tortuous cervical left CCA at the level of the pharynx. Vertebral arteries:Tortuous and atherosclerotic right subclavian artery origin with stenosis up to 75 % with respect to the distal vessel. Normal right vertebral artery origin. Negative right vertebral artery to the skullbase. No proximal left subclavian artery stenosis despite calcified plaque. Normal left vertebral artery origin. The left vertebral is mildly dominant. There is minimal calcified plaque but no stenosis along its course to the neck. CTA HEAD Posterior circulation: Dominant distal left vertebral artery with V4 segment calcified plaque resulting in mild stenosis. The non dominant right vertebral artery is more moderately irregular and stenotic in the V4 segment, but remains patent to the vertebrobasilar junction. Mild basilar artery irregularity and stenosis. SCA and right PCA origins remain patent. Diminutive right posterior communicating artery. The left PCA origin is severely stenotic. There is poor flow in the left PCA branches. Anterior circulation: Both ICA siphons remain patent. There is moderate calcified plaque in both ICA siphons, with severe stenosis at the distal right cavernous segment, and moderate stenosis in the right supraclinoid segment. On the left stenosis is less than 50%. Both carotid termini are patent, but the right ACA A1 segment is non dominant and diminutive. Normal left ACA A1, anterior communicating artery. Moderate to severe left ACA A2 segment stenosis proximal to the left pericallosal artery. Bilateral ACA branches however remain patent. Right MCA M1 segment, bifurcation, and right MCA branches are  within normal limits. There is left MCA occlusion in the mid M1 segment there is an intermediate level of collateral flow in the left MCA branches (series 11,  image 18) Venous sinuses: Patent. Anatomic variants: Dominant left vertebral artery and right A1. Delayed phase: There remains no acute cortically based infarct evident in the left hemisphere. Stable gray-white matter differentiation throughout the brain. No acute intracranial hemorrhage identified. No midline shift, mass effect, or evidence of intracranial mass lesion. IMPRESSION: 1. Positive for emergent large vessel occlusion at the left M1 segment. This was discussed with Dr. Hilda Blades at 1054 hours. Intermediate level collateral enhancement in the left MCA branches. 2. Stable CT appearance of the brain from the initial scan this morning. No acute left MCA ischemia evident by CT. 3. Superimposed severe atherosclerotic stenoses of the right ICA siphon (anterior genu), left ACA A2 segment, and left PCA (with poor left PCA branch enhancement). 4. Up to moderate atherosclerotic stenosis of the non dominant distal right vertebral artery, and basilar artery. 5. High-grade stenosis of the right subclavian artery origin (75%). 6. Large right and moderate to large left layering pleural effusions. Electronically Signed   By: Odessa Fleming M.D.   On: 01/04/2016 11:47   Dg Chest Port 1 View  Result Date: 01/26/2016 CLINICAL DATA:  Hypoxia EXAM: PORTABLE CHEST 1 VIEW COMPARISON:  January 25, 2016 FINDINGS: Endotracheal tube tip is 2.2 cm above the carina. No pneumothorax. There are persistent pleural effusions on the right with cardiomegaly. Pulmonary vascularity is within normal limits. There is mild interstitial edema. No airspace consolidations seen. There is atherosclerotic calcification in the aorta. No evident adenopathy. IMPRESSION: Endotracheal tube as described without apparent pneumothorax. Persistent changes of congestive heart failure. No airspace consolidation appreciable. Electronically Signed   By: Bretta Bang III M.D.   On: 01/26/2016 07:24   Dg Chest Port 1 View  Result Date: 01/31/2016 CLINICAL DATA:   Patient with history of CVA.  ET tube placement. EXAM: PORTABLE CHEST 1 VIEW COMPARISON:  Chest radiograph 01/23/2016 FINDINGS: ET tube terminates in the mid trachea. Monitoring leads overlie the patient. Stable cardiomegaly. Moderate layering bilateral pleural effusions with underlying consolidation. Worsening interstitial opacities and vascular redistribution. No pneumothorax. IMPRESSION: ET tube terminates in the mid trachea. Layering moderate bilateral pleural effusions with underlying consolidation favored to represent atelectasis. Worsening pulmonary edema. Electronically Signed   By: Annia Belt M.D.   On: 01/29/2016 18:48   Ct Head Code Stroke Wo Contrast`  Result Date: 01/14/2016 CLINICAL DATA:  Code stroke. Right-sided weakness and right facial droop. Hip replacement yesterday EXAM: CT HEAD WITHOUT CONTRAST TECHNIQUE: Contiguous axial images were obtained from the base of the skull through the vertex without intravenous contrast. COMPARISON:  CT head 01/21/2016 FINDINGS: Generalized atrophy. Benign cyst right basal ganglia unchanged. Negative for acute infarct. Negative for intracranial hemorrhage. No mass or edema. No change from the recent CT Atherosclerotic calcification in the carotid and vertebral arteries bilaterally. No acute abnormality in the skull. Small air-fluid level sphenoid sinus. Bilateral lens replacement. ASPECTS [facility] Stroke Program Early CT Score) - Ganglionic level infarction (caudate, lentiform nuclei, internal capsule, insula, M1-M3 cortex): 7 - Supraganglionic infarction (M4-M6 cortex): 3 Total score (0-10 with 10 being normal): 10 IMPRESSION: 1. Negative for acute infarct. 2. ASPECTS is 10 These results were called by telephone at the time of interpretation on 01/31/2016 at 10:37 am to Dr. Hilda Blades, who verbally acknowledged these results. Electronically Signed   By: Marlan Palau M.D.   On:  01/28/2016 10:37       PHYSICAL EXAM General - Well nourished, well  developed, in no apparent distress.  Cardiovascular - Irregular rhythm.  Mental Status -  Sedated on ventilator. Not following commands. Opens eyes only partially even to sternal rub. Does not blink to threat. Pupils irregular but sluggishly reactive. Fundi were not visualized. No facial weakness. Moves bilateral UE spontaneously but left more than right. Suspect mild right-sided weakness.  Both legs withdraws to pain Bulk was normal and fasciculations were absent.   Muscle tone was assessed at the neck and appendages and was normal.  ASSESSMENT/PLAN Jacqueline [name] is a 82 y.o. female with history of CAD s/p DES in 2014, DM2, diastolic dysfunction, HLD, HTN, Hypothyroidism, atrial fibrillation not on anticoagulation due to falls presenting with left gaze deviation, right facial droop, R extremity weakness, does not follow verbal commands.    Stroke:  Left MCA infarct embolic secondary to atrial fibrillation s/p mechanical embolectomy  Resultant  R extremity weakness, aphasia  MRI  pending  2D Echo  60-65% EF, normal wall motion and no regional wall motion abnormalities. Severe focal basal hypertrophy of the septum  LDL 73  HgbA1c 5.7  Lovenox for VTE prophylaxis Diet NPO time specified  aspirin 81 mg daily prior to admission, now on No antithrombotic  Ongoing aggressive stroke risk factor management  Therapy recommendations:  pending  Disposition:  pending  Atrial Fibrillation  No anticoagulation due to recurrent falls  Rate controlled  Continue Coreg BID, IV metoprolol prn  Hypertension  Stable  Goal SBP <160  Continue Lasix  Off cardene gtt  IV Hydralazine, Metoprolol prn  Long-term BP goal normotensive  Hyperlipidemia  LDL 73, goal < 70  Add statin when tolerating PO  Pre-Diabetes  HgbA1c 5.7, goal < 7.0  Other Stroke Risk Factors  Advanced age  Obesity, Body mass index is 31.07 kg/m., recommend weight loss, diet and exercise as appropriate    Coronary artery disease  Other Active Problems  Diastolic dysfunction: IV Lasix 40mg  q6hr  Hip Fracture s/p repair  Hypothyroidism, on Synthroid  Goals of care: Son expressed that his mother would not want to be on a ventilator long term and would want to be DNR. She would not want to have trach/PEG.  Hospital day # 5  Discussed with Dr. Bernerd Limbo, MD  Internal Medicine PGY-3 01/27/2016 9:44 AM I have personally examined this patient, reviewed notes, independently viewed imaging studies, participated in medical decision making and plan of care. I have made any additions or clarifications directly to the above note. Agree with note above. Plan to check MRI scan of the brain today and will decide on extubation over the next few days after cardiac and pulmonary clearance. Discussed with patient's daughter at the bedside and answered questions.  This patient is critically ill and at significant risk of neurological worsening, death and care requires constant monitoring of vital signs, hemodynamics,respiratory and cardiac monitoring, extensive review of multiple databases, frequent neurological assessment, discussion with family, other specialists and medical decision making of high complexity.I have made any additions or clarifications directly to the above note.This critical care time does not reflect procedure time, or teaching time or supervisory time of PA/NP/Med Resident etc but could involve care discussion time.  I spent 40 minutes of neurocritical care time  in the care of  this patient.     Jacqueline Heady, MD Medical Director [facility] Pager: [phone] 01/27/2016 1:50 PM  To contact Stroke Continuity provider, please refer to [email or web address] After hours, contact General Neurology

## 2016-01-27 NOTE — Progress Notes (Signed)
Referring Physician(s): Dr Delia Heady  Supervising Physician: Julieanne Cotton  Patient Status:  Inpatient  Chief Complaint:  CVA 8/22: S/P Lt Common carotid arteriogram,follwed by complete revascularization of occluded LT MCA M1 with x 1 pass with solitaire FR 4mm x40 mm retrieval device achieving a TICI 3 reperfusion. Also used 5.4 mg of IA integrelin superselectively   Subjective:  Intubated; vent No response   Allergies: Aspirin and Amlodipine  Medications: Prior to Admission medications   Medication Sig Start Date End Date Taking? Authorizing Provider  acetaminophen (TYLENOL) 325 MG tablet Take 650 mg by mouth every 6 (six) hours as needed for pain.   Yes Historical Provider, MD  aspirin EC 81 MG tablet Take 1 tablet (81 mg total) by mouth daily. 08/12/15  Yes Peter M [place], MD  furosemide (LASIX) 40 MG tablet Take 1 tablet (40 mg total) by mouth daily. 05/19/13  Yes Belkys A Regalado, MD  levothyroxine (SYNTHROID, LEVOTHROID) 112 MCG tablet Take 112 mcg by mouth daily before breakfast.   Yes Historical Provider, MD  olmesartan (BENICAR) 20 MG tablet Take 20 mg by mouth daily.   Yes Historical Provider, MD  potassium chloride SA (K-DUR,KLOR-CON) 20 MEQ tablet Take 20 mEq by mouth daily.    Yes Historical Provider, MD  carvedilol (COREG) 25 MG tablet TAKE 1 TABLET TWICE A DAY WITH MEALS Patient not taking: Reported on 01/23/2016    Peter M [place], MD  nitroGLYCERIN (NITROSTAT) 0.4 MG SL tablet Place 1 tablet (0.4 mg total) under the tongue every 5 (five) minutes as needed for chest pain. Patient not taking: Reported on 01/23/2016 05/12/15   Peter M [place], MD     Vital Signs: BP (!) 165/74 (BP Location: Left Arm)   Pulse 85   Temp 98.1 F (36.7 C) (Axillary)   Resp 11   Ht 5\' 4"  (1.626 m)   Wt 181 lb (82.1 kg)   LMP  (LMP Unknown)   SpO2 100%   BMI 31.07 kg/m   Physical Exam  Skin: Skin is warm and dry.  Rt groin NT; no bleeding No hematoma Rt foot 2+  pulses   Nursing note and vitals reviewed.   Imaging: Ct Angio Head W Or Wo Contrast  Result Date: 01/15/2016 CLINICAL DATA:  82 year old female code stroke with right side weakness. Initial encounter. EXAM: CT ANGIOGRAPHY HEAD AND NECK TECHNIQUE: Multidetector CT imaging of the head and neck was performed using the standard protocol during bolus administration of intravenous contrast. Multiplanar CT image reconstructions and MIPs were obtained to evaluate the vascular anatomy. Carotid stenosis measurements (when applicable) are obtained utilizing NASCET criteria, using the distal internal carotid diameter as the denominator. CONTRAST:  50 mL Isovue 370 COMPARISON:  Head CT without contrast 1025 hours today. FINDINGS: CTA NECK Skeleton: Degenerative changes in the spine. No acute osseous abnormality identified. Visualized paranasal sinuses and mastoids are stable and well pneumatized. Other neck: Moderate to large layering right pleural effusion. Smaller but at least moderate layering left pleural effusion. Fluid may be tracking in the superior aspect of the left major fissure. Moderate to large multinodular thyroid goiter. No superior mediastinal lymphadenopathy. Negative larynx, pharynx, parapharyngeal spaces, retropharyngeal space, sublingual space, submandibular glands and parotid glands. Asymmetric and rounded 8 mm right level 1 B lymph node. Series 6, image 101). Elsewhere cervical lymph nodes appear normal. Aortic arch: 3 vessel arch configuration. Moderate soft and calcified arch atherosclerosis. Right carotid system: No brachiocephalic artery stenosis. Tortuous right CCA origin with  a kinked appearance, but otherwise no stenosis. Mild for age right carotid atherosclerosis in the neck without stenosis. Mildly tortuous distal cervical right ICA. Left carotid system: No left CCA origin stenosis. Tortuous left CCA. Confluent calcified plaque at the left carotid bifurcation involving the left CCA origin  and bulb, but stenosis is less than 50 % with respect to the distal vessel. Tortuous cervical left CCA at the level of the pharynx. Vertebral arteries:Tortuous and atherosclerotic right subclavian artery origin with stenosis up to 75 % with respect to the distal vessel. Normal right vertebral artery origin. Negative right vertebral artery to the skullbase. No proximal left subclavian artery stenosis despite calcified plaque. Normal left vertebral artery origin. The left vertebral is mildly dominant. There is minimal calcified plaque but no stenosis along its course to the neck. CTA HEAD Posterior circulation: Dominant distal left vertebral artery with V4 segment calcified plaque resulting in mild stenosis. The non dominant right vertebral artery is more moderately irregular and stenotic in the V4 segment, but remains patent to the vertebrobasilar junction. Mild basilar artery irregularity and stenosis. SCA and right PCA origins remain patent. Diminutive right posterior communicating artery. The left PCA origin is severely stenotic. There is poor flow in the left PCA branches. Anterior circulation: Both ICA siphons remain patent. There is moderate calcified plaque in both ICA siphons, with severe stenosis at the distal right cavernous segment, and moderate stenosis in the right supraclinoid segment. On the left stenosis is less than 50%. Both carotid termini are patent, but the right ACA A1 segment is non dominant and diminutive. Normal left ACA A1, anterior communicating artery. Moderate to severe left ACA A2 segment stenosis proximal to the left pericallosal artery. Bilateral ACA branches however remain patent. Right MCA M1 segment, bifurcation, and right MCA branches are within normal limits. There is left MCA occlusion in the mid M1 segment there is an intermediate level of collateral flow in the left MCA branches (series 11, image 18) Venous sinuses: Patent. Anatomic variants: Dominant left vertebral artery and  right A1. Delayed phase: There remains no acute cortically based infarct evident in the left hemisphere. Stable gray-white matter differentiation throughout the brain. No acute intracranial hemorrhage identified. No midline shift, mass effect, or evidence of intracranial mass lesion. IMPRESSION: 1. Positive for emergent large vessel occlusion at the left M1 segment. This was discussed with Dr. Hilda Blades at 1054 hours. Intermediate level collateral enhancement in the left MCA branches. 2. Stable CT appearance of the brain from the initial scan this morning. No acute left MCA ischemia evident by CT. 3. Superimposed severe atherosclerotic stenoses of the right ICA siphon (anterior genu), left ACA A2 segment, and left PCA (with poor left PCA branch enhancement). 4. Up to moderate atherosclerotic stenosis of the non dominant distal right vertebral artery, and basilar artery. 5. High-grade stenosis of the right subclavian artery origin (75%). 6. Large right and moderate to large left layering pleural effusions. Electronically Signed   By: Odessa Fleming M.D.   On: 02/14/2016 11:47   Ct Head Wo Contrast  Result Date: 02/14/2016 CLINICAL DATA:  Follow-up code stroke. Status post revascularization of occluded LEFT M1 segment. EXAM: CT HEAD WITHOUT CONTRAST TECHNIQUE: Contiguous axial images were obtained from the base of the skull through the vertex without intravenous contrast. COMPARISON:  CT HEAD 2016/02/14 at 10:25 a.m. FINDINGS: BRAIN: The ventricles and sulci are normal for age. No intraparenchymal hemorrhage, mass effect nor midline shift. Patchy supratentorial white matter hypodensities less than  expected for patient's age, though non-specific are most compatible with chronic small vessel ischemic disease. Slight blurring of the posterior insula gray-white matter differentiation. No abnormal extra-axial fluid collections. Basal cisterns are patent. VASCULAR: Moderate calcific atherosclerosis of the carotid siphons.  Mild residual contrast opacification of the intracranial vessels. SKULL: No skull fracture. No significant scalp soft tissue swelling. Moderate temporomandibular osteoarthrosis. SINUSES/ORBITS: The included ocular globes and orbital contents are non-suspicious. Status post bilateral ocular lens implants. The mastoid air-cells and included paranasal sinuses are well-aerated. OTHER: None. IMPRESSION: Suspected small LEFT MCA territory nonhemorrhagic infarct. Stable involutional changes and mild chronic small vessel ischemic disease. Electronically Signed   By: Awilda Metroourtnay  Bloomer M.D.   On: 01/21/2016 13:41   Ct Angio Neck W Or Wo Contrast  Result Date: 01/04/2016 CLINICAL DATA:  82 year old female code stroke with right side weakness. Initial encounter. EXAM: CT ANGIOGRAPHY HEAD AND NECK TECHNIQUE: Multidetector CT imaging of the head and neck was performed using the standard protocol during bolus administration of intravenous contrast. Multiplanar CT image reconstructions and MIPs were obtained to evaluate the vascular anatomy. Carotid stenosis measurements (when applicable) are obtained utilizing NASCET criteria, using the distal internal carotid diameter as the denominator. CONTRAST:  50 mL Isovue 370 COMPARISON:  Head CT without contrast 1025 hours today. FINDINGS: CTA NECK Skeleton: Degenerative changes in the spine. No acute osseous abnormality identified. Visualized paranasal sinuses and mastoids are stable and well pneumatized. Other neck: Moderate to large layering right pleural effusion. Smaller but at least moderate layering left pleural effusion. Fluid may be tracking in the superior aspect of the left major fissure. Moderate to large multinodular thyroid goiter. No superior mediastinal lymphadenopathy. Negative larynx, pharynx, parapharyngeal spaces, retropharyngeal space, sublingual space, submandibular glands and parotid glands. Asymmetric and rounded 8 mm right level 1 B lymph node. Series 6, image  101). Elsewhere cervical lymph nodes appear normal. Aortic arch: 3 vessel arch configuration. Moderate soft and calcified arch atherosclerosis. Right carotid system: No brachiocephalic artery stenosis. Tortuous right CCA origin with a kinked appearance, but otherwise no stenosis. Mild for age right carotid atherosclerosis in the neck without stenosis. Mildly tortuous distal cervical right ICA. Left carotid system: No left CCA origin stenosis. Tortuous left CCA. Confluent calcified plaque at the left carotid bifurcation involving the left CCA origin and bulb, but stenosis is less than 50 % with respect to the distal vessel. Tortuous cervical left CCA at the level of the pharynx. Vertebral arteries:Tortuous and atherosclerotic right subclavian artery origin with stenosis up to 75 % with respect to the distal vessel. Normal right vertebral artery origin. Negative right vertebral artery to the skullbase. No proximal left subclavian artery stenosis despite calcified plaque. Normal left vertebral artery origin. The left vertebral is mildly dominant. There is minimal calcified plaque but no stenosis along its course to the neck. CTA HEAD Posterior circulation: Dominant distal left vertebral artery with V4 segment calcified plaque resulting in mild stenosis. The non dominant right vertebral artery is more moderately irregular and stenotic in the V4 segment, but remains patent to the vertebrobasilar junction. Mild basilar artery irregularity and stenosis. SCA and right PCA origins remain patent. Diminutive right posterior communicating artery. The left PCA origin is severely stenotic. There is poor flow in the left PCA branches. Anterior circulation: Both ICA siphons remain patent. There is moderate calcified plaque in both ICA siphons, with severe stenosis at the distal right cavernous segment, and moderate stenosis in the right supraclinoid segment. On the left stenosis is less  than 50%. Both carotid termini are patent,  but the right ACA A1 segment is non dominant and diminutive. Normal left ACA A1, anterior communicating artery. Moderate to severe left ACA A2 segment stenosis proximal to the left pericallosal artery. Bilateral ACA branches however remain patent. Right MCA M1 segment, bifurcation, and right MCA branches are within normal limits. There is left MCA occlusion in the mid M1 segment there is an intermediate level of collateral flow in the left MCA branches (series 11, image 18) Venous sinuses: Patent. Anatomic variants: Dominant left vertebral artery and right A1. Delayed phase: There remains no acute cortically based infarct evident in the left hemisphere. Stable gray-white matter differentiation throughout the brain. No acute intracranial hemorrhage identified. No midline shift, mass effect, or evidence of intracranial mass lesion. IMPRESSION: 1. Positive for emergent large vessel occlusion at the left M1 segment. This was discussed with Dr. Hilda Blades at 1054 hours. Intermediate level collateral enhancement in the left MCA branches. 2. Stable CT appearance of the brain from the initial scan this morning. No acute left MCA ischemia evident by CT. 3. Superimposed severe atherosclerotic stenoses of the right ICA siphon (anterior genu), left ACA A2 segment, and left PCA (with poor left PCA branch enhancement). 4. Up to moderate atherosclerotic stenosis of the non dominant distal right vertebral artery, and basilar artery. 5. High-grade stenosis of the right subclavian artery origin (75%). 6. Large right and moderate to large left layering pleural effusions. Electronically Signed   By: Odessa Fleming M.D.   On: 01/04/2016 11:47   Pelvis Portable  Result Date: 01/31/2016 CLINICAL DATA:  Postop film for right hip arthroplasty. EXAM: PORTABLE PELVIS 1-2 VIEWS COMPARISON:  None. FINDINGS: Right hip arthroplasty hardware appears appropriately positioned. Expected postsurgical changes within the overlying soft tissues. Adjacent  osseous pelvis appears intact and normally aligned. IMPRESSION: Expected postsurgical changes status post right hip arthroplasty. Hardware appears appropriately positioned. No evidence of surgical complicating feature. Electronically Signed   By: Bary Richard M.D.   On: 02/02/2016 19:07   Dg Chest Port 1 View  Result Date: 01/26/2016 CLINICAL DATA:  Hypoxia EXAM: PORTABLE CHEST 1 VIEW COMPARISON:  January 25, 2016 FINDINGS: Endotracheal tube tip is 2.2 cm above the carina. No pneumothorax. There are persistent pleural effusions on the right with cardiomegaly. Pulmonary vascularity is within normal limits. There is mild interstitial edema. No airspace consolidations seen. There is atherosclerotic calcification in the aorta. No evident adenopathy. IMPRESSION: Endotracheal tube as described without apparent pneumothorax. Persistent changes of congestive heart failure. No airspace consolidation appreciable. Electronically Signed   By: Bretta Bang III M.D.   On: 01/26/2016 07:24   Dg Chest Port 1 View  Result Date: 01/11/2016 CLINICAL DATA:  Patient with history of CVA.  ET tube placement. EXAM: PORTABLE CHEST 1 VIEW COMPARISON:  Chest radiograph 01/23/2016 FINDINGS: ET tube terminates in the mid trachea. Monitoring leads overlie the patient. Stable cardiomegaly. Moderate layering bilateral pleural effusions with underlying consolidation. Worsening interstitial opacities and vascular redistribution. No pneumothorax. IMPRESSION: ET tube terminates in the mid trachea. Layering moderate bilateral pleural effusions with underlying consolidation favored to represent atelectasis. Worsening pulmonary edema. Electronically Signed   By: Annia Belt M.D.   On: 01/24/2016 18:48   Dg Chest Port 1 View  Result Date: 01/23/2016 CLINICAL DATA:  Pulmonary edema EXAM: PORTABLE CHEST 1 VIEW COMPARISON:  01/12/2016 FINDINGS: Cardiac shadow is stable but mildly enlarged. Aortic calcifications are again seen. Vascular  congestion and right-sided pleural effusion are noted. Stable  right basilar infiltrate is seen. No bony abnormality is noted. IMPRESSION: Increasing vascular congestion when compare with the prior exam. Stable changes in the right base consistent with effusion and infiltrate. Electronically Signed   By: Alcide CleverMark  Lukens M.D.   On: 01/23/2016 17:14   Dg C-arm 1-60 Min  Result Date: 01/04/2016 CLINICAL DATA:  Right anterior hip replacement. EXAM: OPERATIVE RIGHT HIP (WITH PELVIS IF PERFORMED)  VIEWS TECHNIQUE: Fluoroscopic spot image(s) were submitted for interpretation post-operatively. COMPARISON:  None. FINDINGS: Two fluoroscopic spot images are provided showing placement of right hip arthroplasty hardware. Hardware appears appropriately positioned. No evidence of surgical complicating feature appreciated. 18 seconds of fluoroscopy provided for the exam. IMPRESSION: Intraoperative fluoroscopic spot images showing placement of right hip arthroplasty hardware. 18 seconds of fluoroscopy provided. Electronically Signed   By: Bary RichardStan  Maynard M.D.   On: 01/09/2016 18:38   Dg Hip Operative Unilat W Or W/o Pelvis Right  Result Date: 01/04/2016 CLINICAL DATA:  Right anterior hip replacement. EXAM: OPERATIVE RIGHT HIP (WITH PELVIS IF PERFORMED)  VIEWS TECHNIQUE: Fluoroscopic spot image(s) were submitted for interpretation post-operatively. COMPARISON:  None. FINDINGS: Two fluoroscopic spot images are provided showing placement of right hip arthroplasty hardware. Hardware appears appropriately positioned. No evidence of surgical complicating feature appreciated. 18 seconds of fluoroscopy provided for the exam. IMPRESSION: Intraoperative fluoroscopic spot images showing placement of right hip arthroplasty hardware. 18 seconds of fluoroscopy provided. Electronically Signed   By: Bary RichardStan  Maynard M.D.   On: 01/11/2016 18:38   Ct Head Code Stroke Wo Contrast`  Result Date: 01/20/2016 CLINICAL DATA:  Code stroke. Right-sided  weakness and right facial droop. Hip replacement yesterday EXAM: CT HEAD WITHOUT CONTRAST TECHNIQUE: Contiguous axial images were obtained from the base of the skull through the vertex without intravenous contrast. COMPARISON:  CT head 01/12/2016 FINDINGS: Generalized atrophy. Benign cyst right basal ganglia unchanged. Negative for acute infarct. Negative for intracranial hemorrhage. No mass or edema. No change from the recent CT Atherosclerotic calcification in the carotid and vertebral arteries bilaterally. No acute abnormality in the skull. Small air-fluid level sphenoid sinus. Bilateral lens replacement. ASPECTS [facility]([place] Stroke Program Early CT Score) - Ganglionic level infarction (caudate, lentiform nuclei, internal capsule, insula, M1-M3 cortex): 7 - Supraganglionic infarction (M4-M6 cortex): 3 Total score (0-10 with 10 being normal): 10 IMPRESSION: 1. Negative for acute infarct. 2. ASPECTS is 10 These results were called by telephone at the time of interpretation on 01/10/2016 at 10:37 am to Dr. Hilda BladesArmstrong, who verbally acknowledged these results. Electronically Signed   By: Marlan Palauharles  Clark M.D.   On: 01/11/2016 10:37    Labs:  CBC:  Recent Labs  01/15/2016 0303 01/27/2016 0832 01/26/16 0550 01/27/16 0237  WBC 15.0* 8.6 9.7 8.8  HGB 14.1 12.6 11.6* 11.3*  HCT 43.8 39.7 36.5 36.0  PLT 115* 93* 113* 127*    COAGS:  Recent Labs  04/16/15 1802 04/17/15 0244 01/08/2016 1125  INR 1.37 1.40 1.34    BMP:  Recent Labs  01/16/2016 0303 01/12/2016 1125 01/26/16 0550 01/27/16 0237  NA 138 135 138 139  K 3.5 3.7 3.7 3.3*  CL 105 103 105 107  CO2 25 26 26 26   GLUCOSE 144* 112* 102* 89  BUN 27* 37* 37* 39*  CALCIUM 8.9 8.4* 8.5* 8.1*  CREATININE 1.29* 1.20* 1.09* 0.89  GFRNONAA 34* 37* 41* 53*  GFRAA 39* 42* 48* >60    LIVER FUNCTION TESTS:  Recent Labs  04/16/15 1802 04/17/15 0244 01/23/16 0553 01/05/2016 0303 01/27/16 [id number]  BILITOT 1.3* 1.6* 1.2 1.2  --   AST 25 27 25 21   --     ALT 20 18 18 16   --   ALKPHOS 108 95 104 79  --   PROT 6.5 6.2* 6.1* 5.8*  --   ALBUMIN 3.9 3.7 3.4* 3.1* 2.4*    Assessment and Plan:  CVA; L MCA revascularization 8/22 Plan per Stroke Team  Electronically Signed: Eddie Payette A 01/27/2016, 9:17 AM   I spent a total of 15 Minutes at the the patient's bedside AND on the patient's hospital floor or unit, greater than 50% of which was counseling/coordinating care for CVA/L MCA revasc

## 2016-01-27 NOTE — Progress Notes (Signed)
OT Cancellation Note  Patient Details Name: Norval GableMary E Dobrowski MRN: [id number] DOB: 04/30/1917   Cancelled Treatment:    Reason Eval/Treat Not Completed: Patient not medically ready. Possible extubation today. Will check on pt tomorrow for appropriateness.   [facility] CenterWARD,HILLARY  Hailly Fess, OTR/L  [phone] 01/27/2016 01/27/2016, 6:55 AM

## 2016-01-27 NOTE — Progress Notes (Signed)
   01/27/16 1700  Clinical Encounter Type  Visited With Patient and family together;Health care provider  Visit Type Initial  Referral From Family  Spiritual Encounters  Spiritual Needs Prayer;Emotional  Stress Factors  Patient Stress Factors None identified  Family Stress Factors Exhausted;Lack of knowledge  Advance Directives (For Healthcare)  Does patient have an advance directive? Yes    Chaplain visit with family and provided emotional care via prayer.

## 2016-01-27 NOTE — Progress Notes (Signed)
Patient Name: Jacqueline [name] Date of Encounter: 01/27/2016  Active Problems:   Essential hypertension   CAD (coronary artery disease)   Chronic atrial fibrillation (HCC)   Pleural effusion   Hypoxia   Acute on chronic diastolic (congestive) heart failure (HCC)   Moderate pulmonary hypertension ([place])   Closed displaced fracture of right femoral neck (HCC)   Closed right hip fracture (HCC)   Atrial fibrillation with RVR ([place])   Stroke due to embolism of left middle cerebral artery ([place])   Length of Stay: 5  SUBJECTIVE  The patient underwent hip surgery on 8/21 without any complications, however developed a large embolic stroke yesterday, with right hemiplegia, CTA showed occlusion of left MCA, she underwent conservative angiogram with complete revascularization, she was intubated and remains intubated, currently neuro ICU. She is showing signs of neurologic recovery. Not on anticoagulation. No significant changes overnight, her CODE STATUS was changed to DO NOT RESUSCITATE. She opens her eyes and tracks with her family members.  CURRENT MEDS .  stroke: mapping our early stages of recovery book   Does not apply Once  . antiseptic oral rinse  7 mL Mouth Rinse 10 times per day  . carvedilol  12.5 mg Oral BID WC  . chlorhexidine gluconate (SAGE KIT)  15 mL Mouth Rinse BID  . docusate  100 mg Per Tube Daily  . enoxaparin (LOVENOX) injection  30 mg Subcutaneous Q24H  . furosemide  40 mg Intravenous Q12H  . insulin aspart  0-9 Units Subcutaneous Q4H  . irbesartan  150 mg Oral Daily  . levothyroxine  100 mcg Oral QAC breakfast  . pantoprazole sodium  40 mg Per Tube Daily  . sennosides  5 mL Per Tube BID  . sodium chloride flush  3 mL Intravenous Q12H   . sodium chloride 30 mL/hr at 01/26/16 1800  . niCARDipine Stopped (01/24/2016 1545)  . propofol (DIPRIVAN) infusion 7 mcg/kg/min (01/27/16 0700)   OBJECTIVE  Vitals:   01/27/16 0600 01/27/16 0700 01/27/16 0720 01/27/16 0800  BP:  140/60 (!) 149/63 (!) 149/63 (!) 165/74  Pulse: 72 70 80 85  Resp: (!) '25 15 15 11  '$ Temp:    98.1 F (36.7 C)  TempSrc:    Axillary  SpO2: 100% 100% 100% 100%  Weight:      Height:        Intake/Output Summary (Last 24 hours) at 01/27/16 0919 Last data filed at 01/27/16 0800  Gross per 24 hour  Intake            838.6 ml  Output             1750 ml  Net           -911.4 ml   Filed Weights   01/13/2016 1400 01/26/16 0500 01/27/16 0342  Weight: 177 lb 7.5 oz (80.5 kg) 176 lb 9.4 oz (80.1 kg) 181 lb (82.1 kg)    PHYSICAL EXAM  General:Intubated sedated  Neuro: Moves her arms with some weakness on the right side HEENT:  Normal  Neck: Supple without bruits, JVD is +10 cm bilaterally. Lungs:  Resp regular and unlabored, crackles bilaterally. Heart: iRRR no s3, s4, or murmurs. Abdomen: Soft, non-tender, non-distended, BS + x 4.  Extremities: No clubbing, cyanosis or edema. Lateral distortion of her right LLE  Accessory Clinical Findings  CBC  Recent Labs  01/26/16 0550 01/27/16 0237  WBC 9.7 8.8  NEUTROABS  --  7.4  HGB 11.6* 11.3*  HCT 36.5 36.0  MCV 93.6 94.2  PLT 113* 426*   Basic Metabolic Panel  Recent Labs  01/26/16 0550 01/27/16 0237  NA 138 139  K 3.7 3.3*  CL 105 107  CO2 26 26  GLUCOSE 102* 89  BUN 37* 39*  CREATININE 1.09* 0.89  CALCIUM 8.5* 8.1*  MG 2.2 2.1  PHOS 3.2 2.8   Liver Function Tests  Recent Labs  01/27/16 0237  ALBUMIN 2.4*   Radiology/Studies  Dg Chest 2 View  Result Date: 01/31/2016 CLINICAL DATA:  Fall.  Right hip pain.  No reported back pain. EXAM: CHEST  2 VIEW COMPARISON:  04/17/2015 FINDINGS: Bilateral pleural effusions also seen on prior exam. Mild pulmonary venous congestion. No convincing consolidation. No pneumothorax. Chronic cardiomegaly. Mediastinal contours are distorted by rotation. No visible fracture. Exaggerated thoracic kyphosis. Cannot exclude lower thoracic compression deformities, limited by overlapping  shadows, but no reported back pain. IMPRESSION: Chronic bilateral pleural effusion, borderline moderate on the right. Pulmonary venous congestion. Electronically Signed   By: Monte Fantasia M.D.   On: 01/31/2016 21:10   Dg Chest Port 1 View  Result Date: 01/23/2016 CLINICAL DATA:  Pulmonary edema EXAM: PORTABLE CHEST 1 VIEW COMPARISON:  01/12/2016 FINDINGS: Cardiac shadow is stable but mildly enlarged. Aortic calcifications are again seen. Vascular congestion and right-sided pleural effusion are noted. Stable right basilar infiltrate is seen. No bony abnormality is noted. IMPRESSION: Increasing vascular congestion when compare with the prior exam. Stable changes in the right base consistent with effusion and infiltrate. Electronically Signed   By: Inez Catalina M.D.   On: 01/23/2016 17:14   Dg Hip Unilat W Or Wo Pelvis 2-3 Views Right  Result Date: 01/06/2016 CLINICAL DATA:  82 year old female status post unwitnessed fall, found down. Right hip pain. Initial encounter. EXAM: DG HIP (WITH OR WITHOUT PELVIS) 2-3V RIGHT COMPARISON:  None. FINDINGS: Right femoral neck fracture with varus impaction. The intertrochanteric region appears spared. The right femoral head remains located within the acetabulum. Pelvis appears intact. SI joints within normal limits. Grossly intact proximal left femur. Dystrophic appearing calcifications adjacent to the left greater trochanter. Iliac artery atherosclerosis that iliac artery calcified atherosclerosis. IMPRESSION: Acute right femoral neck fracture with varus impaction. Electronically Signed   By: Genevie Ann M.D.   On: 01/27/2016 21:08    TELE: a-fib rate controlled    ASSESSMENT AND PLAN  82 year old female with right hip fracture  1. Permanent atrial fibrillation, now rate controlled chads2vasc score is 6, this is very tough situation, patient is 82 years old and has had falls that led to decision not to anticoagulate despite high embolic risk. She had a large  embolic stroke yesterday with complete revascularization, however considering her fall risk and recent surgery the decision is still not to anticoagulate. She is currently rate controlled on carvedilol given via feeding tube. Ventricular rates in 70s.  2. Acute on chronic diastolic CHF -  The patient is still fluid overloaded, she is -0.9 L since yesterday, I would increase Lasix to 40 mg IV every 6 hours that'll also help with management of her high blood pressure. Her creatinine remains normal at 0.89, she is hypokalemic, I will replace.  3. CAD No ischemic symptoms No further workup as above Resume ASA when able Perioperative beta blocker therapy  4. HTN Controlled  5. Mild AS No further workup or management  Signed, Ena Dawley MD, [facility] 01/27/2016

## 2016-01-27 NOTE — Progress Notes (Signed)
SLP Cancellation Note  Patient Details Name: Jacqueline GableMary E [name] MRN: [id number] DOB: 09/19/1916   Cancelled treatment:       Reason Eval/Treat Not Completed: Medical issues which prohibited therapy (Intubated. Will re check 8/25. )  Ferdinand LangoLeah Nhung Danko MA, CCC-SLP [phone]  [name] 01/27/2016, 8:45 AM

## 2016-01-27 NOTE — Anesthesia Postprocedure Evaluation (Signed)
Anesthesia Post Note  Patient: Jacqueline [name]  Procedure(s) Performed: Procedure(s) (LRB): RADIOLOGY WITH ANESTHESIA (N/A)  Patient location during evaluation: ICU Anesthesia Type: General Level of consciousness: awake and alert and confused (does not follow commands) Pain management: pain level controlled Vital Signs Assessment: post-procedure vital signs reviewed and stable Respiratory status: patient on ventilator - see flowsheet for VS and patient remains intubated per anesthesia plan Cardiovascular status: blood pressure returned to baseline and stable Anesthetic complications: no    Last Vitals:  Vitals:   01/27/16 1330 01/27/16 1340  BP: (!) 90/50 106/61  Pulse: 67 75  Resp: 17 20  Temp:      Last Pain:  Vitals:   01/27/16 1200  TempSrc: Axillary  PainSc:                  Kim Lauver,E. Ariyah Sedlack

## 2016-01-28 ENCOUNTER — Encounter (HOSPITAL_COMMUNITY): Payer: Self-pay | Admitting: Interventional Radiology

## 2016-01-28 LAB — GLUCOSE, CAPILLARY
GLUCOSE-CAPILLARY: 132 mg/dL — AB (ref 65–99)
GLUCOSE-CAPILLARY: 129 mg/dL — AB (ref 65–99)
GLUCOSE-CAPILLARY: 114 mg/dL — AB (ref 65–99)
GLUCOSE-CAPILLARY: 103 mg/dL — AB (ref 65–99)
Glucose-Capillary: 118 mg/dL — ABNORMAL HIGH (ref 65–99)
Glucose-Capillary: 112 mg/dL — ABNORMAL HIGH (ref 65–99)

## 2016-01-28 LAB — CBC WITH DIFFERENTIAL/PLATELET
BASOS ABS: 0 10*3/uL (ref 0.0–0.1)
BASOS PCT: 0 %
EOS PCT: 0 %
Eosinophils Absolute: 0 10*3/uL (ref 0.0–0.7)
HEMATOCRIT: 36.8 % (ref 36.0–46.0)
Hemoglobin: 12.2 g/dL (ref 12.0–15.0)
Lymphocytes Relative: 9 %
Lymphs Abs: 0.6 10*3/uL — ABNORMAL LOW (ref 0.7–4.0)
MCH: 30.7 pg (ref 26.0–34.0)
MCHC: 33.2 g/dL (ref 30.0–36.0)
MCV: 92.5 fL (ref 78.0–100.0)
MONO ABS: 0.6 10*3/uL (ref 0.1–1.0)
MONOS PCT: 8 %
Neutro Abs: 5.9 10*3/uL (ref 1.7–7.7)
Neutrophils Relative %: 83 %
Platelets: 113 10*3/uL — ABNORMAL LOW (ref 150–400)
RBC: 3.98 MIL/uL (ref 3.87–5.11)
RDW: 13.9 % (ref 11.5–15.5)
WBC: 7.1 10*3/uL (ref 4.0–10.5)

## 2016-01-28 LAB — RENAL FUNCTION PANEL
ALBUMIN: 2.3 g/dL — AB (ref 3.5–5.0)
Anion gap: 11 (ref 5–15)
BUN: 40 mg/dL — AB (ref 6–20)
CALCIUM: 8.2 mg/dL — AB (ref 8.9–10.3)
CO2: 24 mmol/L (ref 22–32)
CREATININE: 0.86 mg/dL (ref 0.44–1.00)
Chloride: 109 mmol/L (ref 101–111)
GFR calc Af Amer: >60 mL/min (ref >60)
GFR, EST NON AFRICAN AMERICAN: 55 mL/min — AB (ref >60)
GLUCOSE: 124 mg/dL — AB (ref 65–99)
PHOSPHORUS: 3 mg/dL (ref 2.5–4.6)
Potassium: 3.2 mmol/L — ABNORMAL LOW (ref 3.5–5.1)
SODIUM: 144 mmol/L (ref 135–145)

## 2016-01-28 LAB — MAGNESIUM: MAGNESIUM: 2.2 mg/dL (ref 1.7–2.4)

## 2016-01-28 MED ORDER — ORAL CARE MOUTH RINSE
15.0000 mL | OROMUCOSAL | Status: DC
  Administered 2016-01-28 – 2016-01-30 (×14): 15 mL via OROMUCOSAL

## 2016-01-28 MED ORDER — CHLORHEXIDINE GLUCONATE 0.12 % MT SOLN
15.0000 mL | Freq: Two times a day (BID) | OROMUCOSAL | Status: DC
  Administered 2016-01-28 – 2016-01-30 (×4): 15 mL via OROMUCOSAL
  Filled 2016-01-28 (×4): qty 15

## 2016-01-28 MED ORDER — POTASSIUM CHLORIDE 10 MEQ/100ML IV SOLN
10.0000 meq | INTRAVENOUS | Status: AC
  Administered 2016-01-28 (×4): 10 meq via INTRAVENOUS
  Filled 2016-01-28 (×4): qty 100

## 2016-01-28 NOTE — Progress Notes (Signed)
STROKE TEAM PROGRESS NOTE   SUBJECTIVE (INTERVAL HISTORY) Her daughter is at the bedside.  No acute events overnight. Remains unchanged.    OBJECTIVE Temp:  [97.5 F (36.4 C)-98.1 F (36.7 C)] 97.6 F (36.4 C) (08/25 0400) Pulse Rate:  [43-97] 50 (08/25 0600) Cardiac Rhythm: Normal sinus rhythm (08/24 2000) Resp:  [0-33] 17 (08/25 0600) BP: (90-423)/(45-109) 119/48 (08/25 0600) SpO2:  [96 %-100 %] 100 % (08/25 0600) Arterial Line BP: (72-181)/(59-178) 147/143 (08/24 1800) FiO2 (%):  [40 %] 40 % (08/25 0336) Weight:  [80 kg (176 lb 5.9 oz)] 80 kg (176 lb 5.9 oz) (08/25 0500)  CBC:   Recent Labs Lab 01/27/16 0237 01/28/16 0350  WBC 8.8 7.1  NEUTROABS 7.4 5.9  HGB 11.3* 12.2  HCT 36.0 36.8  MCV 94.2 92.5  PLT 127* 113*    Basic Metabolic Panel:   Recent Labs Lab 01/27/16 0237 01/28/16 0350  NA 139 144  K 3.3* 3.2*  CL 107 109  CO2 26 24  GLUCOSE 89 124*  BUN 39* 40*  CREATININE 0.89 0.86  CALCIUM 8.1* 8.2*  MG 2.1 2.2  PHOS 2.8 3.0    Lipid Panel:     Component Value Date/Time   CHOL 115 01/26/2016 0550   TRIG 84 01/26/2016 0550   HDL 25 (L) 01/26/2016 0550   CHOLHDL 4.6 01/26/2016 0550   VLDL 17 01/26/2016 0550   LDLCALC 73 01/26/2016 0550   HgbA1c:  Lab Results  Component Value Date   HGBA1C 5.7 (H) 01/26/2016     IMAGING  Mr Brain Wo Contrast  Result Date: 01/27/2016 CLINICAL DATA:  82 y/o  F; stroke post intervention for follow-up. EXAM: MRI HEAD WITHOUT CONTRAST TECHNIQUE: Multiplanar, multiecho pulse sequences of the brain and surrounding structures were obtained without intravenous contrast. COMPARISON:  CT head and CT angiogram head and neck 01/20/2016 FINDINGS: Brain: Large region of diffusion restriction primarily involving cortex of the left temporal and posterior parietal lobe with additional small area of diffusion restriction within the left posterior insula and left posterior lentiform nucleus. There are few punctate foci of  susceptibility hypointensity within the region of infarction and left parietal operculum and left posterior lateral temporal lobe consistent with petechial hemorrhage. The region of infarction has associated T2 FLAIR hyperintensity and mild mass effect partially effacing the temporal horn of left lateral ventricle and resulting in left to right midline shift of 4 mm. Background of mild parenchymal volume loss and chronic microvascular ischemic changes. Extra-axial space: No hydrocephalus. No extra-axial collection is identified. No abnormality of the cervical medullary junction. Other: No abnormal signal of the paranasal sinuses. No abnormal signal of the mastoid air cells. Bilateral intra-ocular lens replacement. Calvarium is unremarkable. IMPRESSION: Large acute infarction involving the left inferior division of MCA distribution. Few small foci of susceptibility hypointensity are compatible with petechial hemorrhage. Mild associated mass effect with 4 mm of left-to-right midline shift. Electronically Signed   By: Mitzi HansenLance  Furusawa-Stratton M.D.   On: 01/27/2016 23:14       PHYSICAL EXAM General - frail elderly caucasian lady, in no apparent distress.  Cardiovascular - Irregular rhythm.  Mental Status -  Sedated on ventilator. Not following commands. Opens eyes only partially even to sternal rub. Does not blink to threat. Pupils irregular but sluggishly reactive. Fundi were not visualized. No facial weakness. Moves bilateral UE spontaneously but left more than right. Suspect mild right-sided weakness.  Both legs withdraws to pain Bulk was normal and fasciculations were absent.  Muscle tone was assessed at the neck and appendages and was normal.  ASSESSMENT/PLAN [name] is a 82 y.o. female with history of CAD s/p DES in 2014, DM2, diastolic dysfunction, HLD, HTN, Hypothyroidism, atrial fibrillation not on anticoagulation due to falls presenting with left gaze deviation, right facial  droop, R extremity weakness, does not follow verbal commands.    Stroke:  Left MCA infarct embolic secondary to atrial fibrillation s/p mechanical embolectomy  Resultant  R extremity weakness, aphasia  MRI 8/24 Large acute infarction involving left inferior division of MCA distribution. Few small petechial hemorrhage. Mild associated mass effect with 4mm of left-to-to right midline shift.  2D Echo  60-65% EF, normal wall motion and no regional wall motion abnormalities. Severe focal basal hypertrophy of the septum  LDL 73  HgbA1c 5.7  Lovenox for VTE prophylaxis Diet NPO time specified  aspirin 81 mg daily prior to admission, now on No antithrombotic  Therapy recommendations:  pending  Disposition:  Pending. Discussed with daughter poor prognosis given imaging findings and clinical course. Daughter will talk to her brother and they will decide on goals of care. Recommended one way extubation with comfort care.  Atrial Fibrillation  No full anticoagulation due to recurrent falls  Rate controlled  Continue IV metoprolol prn  Hypertension  Stable  Goal SBP <160  Continue Lasix  Off cardene gtt  IV Hydralazine, Metoprolol prn  Long-term BP goal normotensive  Hyperlipidemia  LDL 73, goal < 70  Pre-Diabetes  HgbA1c 5.7, goal < 7.0  Other Stroke Risk Factors  Advanced age  Obesity, Body mass index is 30.27 kg/m., recommend weight loss, diet and exercise as appropriate   Coronary artery disease  Other Active Problems  Diastolic dysfunction: IV Lasix 40mg  q6hr  Hip Fracture s/p repair  Hypothyroidism, on Synthroid  Hospital day # 6  Discussed with Dr. Bernerd Limbo, MD  Internal Medicine PGY-3 01/28/2016 7:44 AM I have personally examined this patient, reviewed notes, independently viewed imaging studies, participated in medical decision making and plan of care. I have made any additions or clarifications directly to the above note. Agree with  note above.  The patient's MRI shows a fairly large left MCA infarct involving the temporal and posterior frontal and parietal lobes which is likely return to resultant significant global aphasia as well as dysphagia. Patient unlikely to survive without prolonged ventilatory support and feeding tube. I had a long discussion with patient's daughter at the bedside and she understood and wants to discuss with her brother before making final decision about one way extubation and no reintubation. Continue present management till then. D/w Dr Jamison Neighbor. This patient is critically ill and at significant risk of neurological worsening, death and care requires constant monitoring of vital signs, hemodynamics,respiratory and cardiac monitoring, extensive review of multiple databases, frequent neurological assessment, discussion with family, other specialists and medical decision making of high complexity.I have made any additions or clarifications directly to the above note.This critical care time does not reflect procedure time, or teaching time or supervisory time of PA/NP/Med Resident etc but could involve care discussion time.  I spent 50 minutes of neurocritical care time  in the care of  this patient.      Delia Heady, MD Medical Director [facility] Pager: [phone] 01/28/2016 2:55 PM   To contact Stroke Continuity provider, please refer to [email or web address]. After hours, contact General Neurology

## 2016-01-28 NOTE — Progress Notes (Signed)
PT Cancellation Note  Patient Details Name: Jacqueline [name] MRN: [id number] DOB: 01/20/1917   Cancelled Treatment:    Reason Eval/Treat Not Completed: Patient not medically ready ( Pt not appropriate medically at this time, will sign off, )   Fabio AsaWerner, Jacqueline [name] 01/28/2016, 4:23 PM Jacqueline [name], PT DPT  [phone]

## 2016-01-28 NOTE — Progress Notes (Signed)
OT Cancellation Note/D/C  Patient Details Name: Norval GableMary E Schmale MRN: [id number] DOB: 05/17/1917   Cancelled Treatment:    Reason Eval/Treat Not Completed: Other (comment). Pt not appropriate medically at this time. OT signing off. Please reorder when appropriate. Thanks.  [facility] BartlettWARD,HILLARY  Ferman Basilio, OTR/L  [phone] 01/28/2016 01/28/2016, 7:13 AM

## 2016-01-28 NOTE — Progress Notes (Signed)
PULMONARY / CRITICAL CARE MEDICINE   Name: Jacqueline [name] MRN: [id number] DOB: [birth year]    ADMISSION DATE:  01/21/2016 CONSULTATION DATE:  01/31/2016  REFERRING MD:  Dr. Corliss Skains  CHIEF COMPLAINT: Left M-1 segment occlussion  HISTORY OF PRESENT ILLNESS:  82 year old female with PMH of DM, chronic AF not on anticoagulation secondary to chronic falls (off eliquis 6 months ago), CAD, AS, Diastolic HF, HTN, HLD, hypothyroidism admitted 8/19 after a mechanical fall..  She is a resident at assisted living and is fairly active.  Unfortunately she suffered a mechanical fall on 8/19 and fractured her right femoral neck.  Pre-op she was seen by Cardiology for clearance and given high risk given PMH and age.  Surgery was postponed secondary to development of pulmonary edema and hypoxia on the 8/20 which improved with lasix and oxygen.  The patient underwent a right hemi-arthroplasty on the evening of 8/21 and was extubated without complications. She was placed on lovenox for DVT coverage.  On the morning of 8/22 she was LKW around 0830, then later found with left gaze, right hemiplegia, right facial droop, right hemianopia, and NIHSS of 28.  CTA showed occlusion of left M-1 segment of MCA.  She was intubated and underwent cerebral angiogram with complete revascularization.  The patient remained intubated post-procedure and transferred to Neuro ICU.  PCCM consulted for further ventilator and ICU management.    SUBJECTIVE:  No acute events. Patient had MRI performed. Transitioned to Precedex infusion. Resting more comfortably.  REVIEW OF SYSTEMS:  Unable to be assess as patient is intubated.   VITAL SIGNS: BP (!) 119/48   Pulse (!) 50   Temp (!) 96.4 F (35.8 C) (Axillary)   Resp 17   Ht 5\' 4"  (1.626 m)   Wt 176 lb 5.9 oz (80 kg)   LMP  (LMP Unknown)   SpO2 100%   BMI 30.27 kg/m   HEMODYNAMICS:    VENTILATOR SETTINGS: Vent Mode: PRVC FiO2 (%):  [40 %] 40 % Set Rate:  [14 bmp] 14 bmp Vt Set:   [440 mL] 440 mL PEEP:  [5 cmH20] 5 cmH20 Plateau Pressure:  [14 cmH20-19 cmH20] 15 cmH20  INTAKE / OUTPUT: I/O last 3 completed shifts: In: 1756.9 [I.V.:1306.9; IV Piggyback:450] Out: 2565 [Urine:2565]  PHYSICAL EXAMINATION: General:  Elderly female. Eyes closed. Family at bedside.  Neuro: Sedated. Pupils pinpoint and symmetric. No spontaneous movements. HEENT:  Endotracheal tube in place. No scleral injection. Moist mucous membranes.  Cardiovascular:  Regular rate with a regular rhythm. No appreciable JVD. No edema.  Lungs:  Coarse breath sounds bilaterally unchanged. Symmetric chest wall rise on ventilator.  Abdomen:  Soft. Nondistended. Normal bowel sounds.  Musculoskeletal:  No acute deformities.  Dressing to right anterior hip clean, dry, and intact.  Skin:  Warm and dry. No rash on exposed skin.   LABS:  BMET  Recent Labs Lab 01/26/16 0550 01/27/16 0237 01/28/16 0350  NA 138 139 144  K 3.7 3.3* 3.2*  CL 105 107 109  CO2 26 26 24   BUN 37* 39* 40*  CREATININE 1.09* 0.89 0.86  GLUCOSE 102* 89 124*    Electrolytes  Recent Labs Lab 01/26/16 0550 01/27/16 0237 01/28/16 0350  CALCIUM 8.5* 8.1* 8.2*  MG 2.2 2.1 2.2  PHOS 3.2 2.8 3.0    CBC  Recent Labs Lab 01/26/16 0550 01/27/16 0237 01/28/16 0350  WBC 9.7 8.8 7.1  HGB 11.6* 11.3* 12.2  HCT 36.5 36.0 36.8  PLT 113* 127*  113*    Coag's  Recent Labs Lab 2016/02/23 1125  INR 1.34    Sepsis Markers No results for input(s): LATICACIDVEN, PROCALCITON, O2SATVEN in the last 168 hours.  ABG  Recent Labs Lab Feb 23, 2016 1653 01/26/16 0230  PHART 7.375 7.385  PCO2ART 44.5 44.8  PO2ART 79.0* 111*    Liver Enzymes  Recent Labs Lab 01/23/16 0553 01/21/2016 0303 01/27/16 0237 01/28/16 0350  AST 25 21  --   --   ALT 18 16  --   --   ALKPHOS 104 79  --   --   BILITOT 1.2 1.2  --   --   ALBUMIN 3.4* 3.1* 2.4* 2.3*    Cardiac Enzymes  Recent Labs Lab 01/20/2016 2301 01/23/16 0553  TROPONINI  0.03* 0.07*    Glucose  Recent Labs Lab 01/27/16 1119 01/27/16 1541 01/27/16 1943 01/27/16 2320 01/28/16 0325 01/28/16 0823  GLUCAP 94 104* 118* 128* 132* 129*    Imaging Mr Brain Wo Contrast  Result Date: 01/27/2016 CLINICAL DATA:  82 y/o  F; stroke post intervention for follow-up. EXAM: MRI HEAD WITHOUT CONTRAST TECHNIQUE: Multiplanar, multiecho pulse sequences of the brain and surrounding structures were obtained without intravenous contrast. COMPARISON:  CT head and CT angiogram head and neck 02/23/16 FINDINGS: Brain: Large region of diffusion restriction primarily involving cortex of the left temporal and posterior parietal lobe with additional small area of diffusion restriction within the left posterior insula and left posterior lentiform nucleus. There are few punctate foci of susceptibility hypointensity within the region of infarction and left parietal operculum and left posterior lateral temporal lobe consistent with petechial hemorrhage. The region of infarction has associated T2 FLAIR hyperintensity and mild mass effect partially effacing the temporal horn of left lateral ventricle and resulting in left to right midline shift of 4 mm. Background of mild parenchymal volume loss and chronic microvascular ischemic changes. Extra-axial space: No hydrocephalus. No extra-axial collection is identified. No abnormality of the cervical medullary junction. Other: No abnormal signal of the paranasal sinuses. No abnormal signal of the mastoid air cells. Bilateral intra-ocular lens replacement. Calvarium is unremarkable. IMPRESSION: Large acute infarction involving the left inferior division of MCA distribution. Few small foci of susceptibility hypointensity are compatible with petechial hemorrhage. Mild associated mass effect with 4 mm of left-to-right midline shift. Electronically Signed   By: Mitzi Hansen M.D.   On: 01/27/2016 23:14     STUDIES:  CT C-Spine 8/19: no acute  findings; mild degenerative changes CT Head 8/19: atrophy and chronic small vessel ischemic change; no acute/traumatic finding TTE 8/21: mod LVH; severe focal basal hypertrophy of the septum; LVEF 60-65%; normal wall motion, no regional wall motion abnormalities; mild AS with trivial AR; MV with calcified annulus and mildly thickened leaflets, LA mod dilated, RA severely dilated; mod TR, PAS 53 mmHg CTA Head/Neck 8/22: large occlusion of left M1 segment,  Severe atherosclerotic stenosis of right ICA siphon, left ACA A2 segment, and left PCA with poor branch enhancement; moderate atherosclerotic stenosis of the non dominant distal right vertebral artery and basilar artery; high grade stenosis of RCA origin; large right and mod to large left layering pleural efusions CT Head 8/22 (post intervention): suspected small left MCA territory nonhemorrhagic infarct; stable involutional changes and mild chronic small vessel ischemic disease Port CXR 8/23: Bilateral lower lung opacities. Endotracheal tube in good position. MRI 8/24: Large acute infarction involving left inferior division of MCA distribution. Small foci of susceptibility hypointensity are compatible with petechial hemorrhage. Mild  associated mass effect with 4 mm left-to-right midline shift.   MICROBIOLOGY: MRSA PCR 8/20:  Negative   ANTIBIOTICS: Ancef 8/21 Periop PPx  SIGNIFICANT EVENTS: 8/19 -  Admit after fall with R fem neck fx 8/20 -  Pulmonary edema, hypoxia that improved with lasix / O2 8/21 -  OR for R anterior ORIF 8/22 -  L M1 CVA, to neuro IR for revascularization   LINES/TUBES: R fem sheath 8/22 - 8/23 OETT 7.5 8/22 >> L Radial Art Line 8/22 >> Foley >> PIV x2  ASSESSMENT / PLAN:  NEUROLOGIC A:   L M-1 CVA - S/P Revascularization 8/22. Sedation on Ventilator  P:   RASS goal: -1 Precedex gtt Propofol gtt Fentanyl IV prn Neurology guiding further goals of care  PULMONARY A: Acute Hypoxic Respiratory Failure -  Multifactorial w/ acute CVA, AMS, & Probable Pulmonary Edema. Bilateral Pleural Effusions  P:   Full Vent Support Wean PEEP / FiO2 for sats > 92% Daily SBT  CARDIOVASCULAR A:  PAF - Not on anticoagulation due to increased fall risk  HTN H/O MildAortic Stenosis H/O HLD H/O Diastolic Dysfunction  H/O CAD  P:  Cardiology Following ICU monitoring of hemodynamics  BP parameters per Neurology Cardene gtt as needed Holding Coreg 12.5mg  bid & Avapro 150mg  daily Lopressor IV prn Hydralazine IV prn Lasix IV q12hr  RENAL A:   Hypokalemia - Replacing.  P:   Trending UOP with Foley Monitoring electrolytes & renal function daily Replacing electrolytes as indicated KCl 10mEq IV x4 runs  GASTROINTESTINAL A:   Constipation   P:   NPO  Pepcid IV daily Holding Senna & Colace  HEMATOLOGIC A:   Thrombocytopenia - Mild. Count stable. Leukocytosis - Resolved.  P:  Trending cell counts daily w/ CBC SCDs Lovenox 30mg  Emery daily  INFECTIOUS A:   No acute infection.  P:   Monitor surgical site  Trend fever / WBC curve  ENDOCRINE A:   H/O DM - Glucose controlled. A1c 5.6. H/O Hypothyroidism   P:   Accu-Checks q4hr SSI per Sensitive Algorithm Synthroid IV daily  ORTHOPEDIC A: S/P R Hip Arthroplasty  P: PT/OT Consulted  FAMILY  - Updates: Daughter updated at bedside 8/25.   - Inter-disciplinary family meet or Palliative Care meeting due by:  8/29  TODAY'S SUMMARY:  82 y.o. female with PMH of Afib (not on AC 2/2 to frequent falls), DM, Diastolic HF, AS, and HTN  admitted 8/19 after mechanical fall sustaining R neck femoral neck fracture.  Developed pulmonary edema 8/20 and hypoxia thought secondary to IVF improved after lasix which delayed surgery. Taken to the OR on 8/21 for right hip hemiarthroplasty.  On 8/22 found with right hemiplegia, hemianopia, facial droop, and left gaze with NIHSS 28.  Taken to neuro IR for revascularization & stable post procedure.  Significant injury with probable dysphagia. Patient's son traveling to be at bedside for terminal vent weaning and one way extubation tomorrow.  I have spent a total of 35 minutes of critical care time today caring for the patient, updating daughter at bedside, and reviewing the patient's electronic medical record.   Donna ChristenJennings E. Jamison NeighborNestor, M.D. [facility] Pager:  [phone] After 3pm or if no response, call [phone]\ 01/28/2016, 10:54 AM

## 2016-01-29 ENCOUNTER — Inpatient Hospital Stay (HOSPITAL_COMMUNITY): Payer: Medicare Other

## 2016-01-29 DIAGNOSIS — Z515 Encounter for palliative care: Secondary | ICD-10-CM

## 2016-01-29 DIAGNOSIS — I639 Cerebral infarction, unspecified: Secondary | ICD-10-CM

## 2016-01-29 DIAGNOSIS — G9340 Encephalopathy, unspecified: Secondary | ICD-10-CM

## 2016-01-29 LAB — CBC WITH DIFFERENTIAL/PLATELET
BASOS ABS: 0 10*3/uL (ref 0.0–0.1)
BASOS PCT: 0 %
EOS ABS: 0 10*3/uL (ref 0.0–0.7)
Eosinophils Relative: 0 %
HCT: 39.4 % (ref 36.0–46.0)
HEMOGLOBIN: 12.5 g/dL (ref 12.0–15.0)
Lymphocytes Relative: 6 %
Lymphs Abs: 0.6 10*3/uL — ABNORMAL LOW (ref 0.7–4.0)
MCH: 30 pg (ref 26.0–34.0)
MCHC: 31.7 g/dL (ref 30.0–36.0)
MCV: 94.5 fL (ref 78.0–100.0)
MONOS PCT: 7 %
Monocytes Absolute: 0.7 10*3/uL (ref 0.1–1.0)
NEUTROS PCT: 87 %
Neutro Abs: 8 10*3/uL — ABNORMAL HIGH (ref 1.7–7.7)
Platelets: 135 10*3/uL — ABNORMAL LOW (ref 150–400)
RBC: 4.17 MIL/uL (ref 3.87–5.11)
RDW: 13.9 % (ref 11.5–15.5)
WBC: 9.2 10*3/uL (ref 4.0–10.5)

## 2016-01-29 LAB — RENAL FUNCTION PANEL
ALBUMIN: 2.2 g/dL — AB (ref 3.5–5.0)
ANION GAP: 8 (ref 5–15)
BUN: 46 mg/dL — ABNORMAL HIGH (ref 6–20)
CALCIUM: 8.6 mg/dL — AB (ref 8.9–10.3)
CO2: 30 mmol/L (ref 22–32)
Chloride: 111 mmol/L (ref 101–111)
Creatinine, Ser: 1.07 mg/dL — ABNORMAL HIGH (ref 0.44–1.00)
GFR calc Af Amer: 49 mL/min — ABNORMAL LOW (ref >60)
GFR calc non Af Amer: 42 mL/min — ABNORMAL LOW (ref >60)
GLUCOSE: 119 mg/dL — AB (ref 65–99)
PHOSPHORUS: 3.2 mg/dL (ref 2.5–4.6)
Potassium: 3.2 mmol/L — ABNORMAL LOW (ref 3.5–5.1)
SODIUM: 149 mmol/L — AB (ref 135–145)

## 2016-01-29 LAB — MAGNESIUM: MAGNESIUM: 2.3 mg/dL (ref 1.7–2.4)

## 2016-01-29 LAB — GLUCOSE, CAPILLARY
Glucose-Capillary: 107 mg/dL — ABNORMAL HIGH (ref 65–99)
Glucose-Capillary: 113 mg/dL — ABNORMAL HIGH (ref 65–99)

## 2016-01-29 MED ORDER — MORPHINE SULFATE 25 MG/ML IV SOLN
10.0000 mg/h | INTRAVENOUS | Status: DC
  Administered 2016-01-29: 2 mg/h via INTRAVENOUS
  Filled 2016-01-29: qty 10

## 2016-01-29 MED ORDER — MORPHINE BOLUS VIA INFUSION
5.0000 mg | INTRAVENOUS | Status: DC | PRN
  Filled 2016-01-29: qty 20

## 2016-01-29 MED ORDER — LORAZEPAM 2 MG/ML IJ SOLN
INTRAMUSCULAR | Status: AC
  Administered 2016-01-29: 0.5 mg via INTRAVENOUS
  Filled 2016-01-29: qty 1

## 2016-01-29 MED ORDER — LORAZEPAM 2 MG/ML IJ SOLN
0.5000 mg | INTRAMUSCULAR | Status: DC | PRN
  Administered 2016-01-29: 0.5 mg via INTRAVENOUS

## 2016-01-29 NOTE — Progress Notes (Signed)
PULMONARY / CRITICAL CARE MEDICINE   Name: Jacqueline [name] MRN: [id number] DOB: 01/18/1917    ADMISSION DATE:  01/20/2016 CONSULTATION DATE:  01/14/2016  REFERRING MD:  Dr. Corliss Skainseveshwar  CHIEF COMPLAINT: Left M-1 segment occlussion  HISTORY OF PRESENT ILLNESS:  82 year old female with PMH of DM, chronic AF not on anticoagulation secondary to chronic falls (off eliquis 6 months ago), CAD, AS, Diastolic HF, HTN, HLD, hypothyroidism admitted 8/19 after a mechanical fall..  She is a resident at assisted living and is fairly active.  Unfortunately she suffered a mechanical fall on 8/19 and fractured her right femoral neck.  Pre-op she was seen by Cardiology for clearance and given high risk given PMH and age.  Surgery was postponed secondary to development of pulmonary edema and hypoxia on the 8/20 which improved with lasix and oxygen.  The patient underwent a right hemi-arthroplasty on the evening of 8/21 and was extubated without complications. She was placed on lovenox for DVT coverage.  On the morning of 8/22 she was LKW around 0830, then later found with left gaze, right hemiplegia, right facial droop, right hemianopia, and NIHSS of 28.  CTA showed occlusion of left M-1 segment of MCA.  She was intubated and underwent cerebral angiogram with complete revascularization.  The patient remained intubated post-procedure and transferred to Neuro ICU.  PCCM consulted for further ventilator and ICU management.    SUBJECTIVE:  No events overnight, moving upper ext spontaneously.  VITAL SIGNS: BP 133/63 (BP Location: Left Arm)   Pulse 75   Temp 97.6 F (36.4 C) (Axillary)   Resp 19   Ht 5\' 4"  (1.626 m)   Wt 80 kg (176 lb 5.9 oz)   LMP  (LMP Unknown)   SpO2 100%   BMI 30.27 kg/m   HEMODYNAMICS:    VENTILATOR SETTINGS: Vent Mode: PSV;CPAP FiO2 (%):  [40 %] 40 % Set Rate:  [14 bmp] 14 bmp Vt Set:  [440 mL] 440 mL PEEP:  [5 cmH20] 5 cmH20 Pressure Support:  [10 cmH20] 10 cmH20 Plateau Pressure:   [14 cmH20-15 cmH20] 14 cmH20  INTAKE / OUTPUT: I/O last 3 completed shifts: In: 1880.8 [I.V.:1430.8; IV Piggyback:450] Out: 2325 [Urine:2325]  PHYSICAL EXAMINATION: General:  Elderly female. Eyes closed. Family at bedside.  Neuro: Pupils pinpoint and symmetric. Spontaneous movement of both arms, not legs. HEENT:  Endotracheal tube in place. No scleral injection. Moist mucous membranes.  Cardiovascular:  Regular rate with a regular rhythm. No appreciable JVD. No edema.  Lungs:  Coarse breath sounds bilaterally unchanged. Symmetric chest wall rise on ventilator.  Abdomen:  Soft. Nondistended. Normal bowel sounds.  Musculoskeletal:  No acute deformities.  Dressing to right anterior hip clean, dry, and intact.  Skin:  Warm and dry. No rash on exposed skin.   LABS:  BMET  Recent Labs Lab 01/27/16 0237 01/28/16 0350 01/29/16 0356  NA 139 144 149*  K 3.3* 3.2* 3.2*  CL 107 109 111  CO2 26 24 30   BUN 39* 40* 46*  CREATININE 0.89 0.86 1.07*  GLUCOSE 89 124* 119*    Electrolytes  Recent Labs Lab 01/27/16 0237 01/28/16 0350 01/29/16 0356  CALCIUM 8.1* 8.2* 8.6*  MG 2.1 2.2 2.3  PHOS 2.8 3.0 3.2    CBC  Recent Labs Lab 01/27/16 0237 01/28/16 0350 01/29/16 0356  WBC 8.8 7.1 9.2  HGB 11.3* 12.2 12.5  HCT 36.0 36.8 39.4  PLT 127* 113* 135*    Coag's  Recent Labs Lab 01/17/2016  1125  INR 1.34    Sepsis Markers No results for input(s): LATICACIDVEN, PROCALCITON, O2SATVEN in the last 168 hours.  ABG  Recent Labs Lab 07-Feb-2016 1653 01/26/16 0230  PHART 7.375 7.385  PCO2ART 44.5 44.8  PO2ART 79.0* 111*    Liver Enzymes  Recent Labs Lab 01/23/16 0553 01/31/2016 0303 01/27/16 0237 01/28/16 0350 01/29/16 0356  AST 25 21  --   --   --   ALT 18 16  --   --   --   ALKPHOS 104 79  --   --   --   BILITOT 1.2 1.2  --   --   --   ALBUMIN 3.4* 3.1* 2.4* 2.3* 2.2*    Cardiac Enzymes  Recent Labs Lab 01/04/2016 2301 01/23/16 0553  TROPONINI 0.03* 0.07*     Glucose  Recent Labs Lab 01/28/16 1140 01/28/16 1603 01/28/16 2018 01/28/16 2314 01/29/16 0346 01/29/16 0757  GLUCAP 118* 114* 103* 112* 107* 113*    Imaging No results found.   STUDIES:  CT C-Spine 8/19: no acute findings; mild degenerative changes CT Head 8/19: atrophy and chronic small vessel ischemic change; no acute/traumatic finding TTE 8/21: mod LVH; severe focal basal hypertrophy of the septum; LVEF 60-65%; normal wall motion, no regional wall motion abnormalities; mild AS with trivial AR; MV with calcified annulus and mildly thickened leaflets, LA mod dilated, RA severely dilated; mod TR, PAS 53 mmHg CTA Head/Neck 8/22: large occlusion of left M1 segment,  Severe atherosclerotic stenosis of right ICA siphon, left ACA A2 segment, and left PCA with poor branch enhancement; moderate atherosclerotic stenosis of the non dominant distal right vertebral artery and basilar artery; high grade stenosis of RCA origin; large right and mod to large left layering pleural efusions CT Head 8/22 (post intervention): suspected small left MCA territory nonhemorrhagic infarct; stable involutional changes and mild chronic small vessel ischemic disease Port CXR 8/23: Bilateral lower lung opacities. Endotracheal tube in good position. MRI 8/24: Large acute infarction involving left inferior division of MCA distribution. Small foci of susceptibility hypointensity are compatible with petechial hemorrhage. Mild associated mass effect with 4 mm left-to-right midline shift.   MICROBIOLOGY: MRSA PCR 8/20:  Negative   ANTIBIOTICS: Ancef 8/21 Periop PPx  SIGNIFICANT EVENTS: 8/19 -  Admit after fall with R fem neck fx 8/20 -  Pulmonary edema, hypoxia that improved with lasix / O2 8/21 -  OR for R anterior ORIF 8/22 -  L M1 CVA, to neuro IR for revascularization   LINES/TUBES: R fem sheath 8/22 - 8/23 OETT 7.5 8/22 >>8/26 L Radial Art Line 8/22 >> Foley >> PIV x2  ASSESSMENT /  PLAN:  NEUROLOGIC A:   L M-1 CVA - S/P Revascularization 8/22. Sedation on Ventilator  P:   RASS goal: -1 Precedex gtt Propofol gtt Fentanyl IV prn Neurology guiding further goals of care  PULMONARY A: Acute Hypoxic Respiratory Failure - Multifactorial w/ acute CVA, AMS, & Probable Pulmonary Edema. Bilateral Pleural Effusions  P:   Terminal extubation today. If fails then morphine  CARDIOVASCULAR A:  PAF - Not on anticoagulation due to increased fall risk  HTN H/O MildAortic Stenosis H/O HLD H/O Diastolic Dysfunction  H/O CAD  P:  Tele monitoring to off, full DNR.  RENAL A:   Hypokalemia - Replacing.  P:   D/C blood draws.  GASTROINTESTINAL A:   Constipation   P:   NPO  HEMATOLOGIC A:   Thrombocytopenia - Mild. Count stable. Leukocytosis - Resolved.  P:  D/C further blood draws.  INFECTIOUS A:   No acute infection.  P:   Monitor.  ENDOCRINE A:   H/O DM - Glucose controlled. A1c 5.6. H/O Hypothyroidism   P:   D/C CBG and ISS.  ORTHOPEDIC A: S/P R Hip Arthroplasty  P: D/C PT  FAMILY  - Updates: Family updated bedside, full DNR, extubate today, if decompensates then start morphine.  Withdrawal orderset placed.  - Inter-disciplinary family meet or Palliative Care meeting due by:  8/29  The patient is critically ill with multiple organ systems failure and requires high complexity decision making for assessment and support, frequent evaluation and titration of therapies, application of advanced monitoring technologies and extensive interpretation of multiple databases.   Critical Care Time devoted to patient care services described in this note is  35  Minutes. This time reflects time of care of this signee Dr Koren Bound. This critical care time does not reflect procedure time, or teaching time or supervisory time of PA/NP/Med student/Med Resident etc but could involve care discussion time.  Alyson Reedy, M.D. [facility]  Pulmonary/Critical Care Medicine. Pager: [phone]. After hours pager: [phone].

## 2016-01-29 NOTE — Progress Notes (Signed)
STROKE TEAM PROGRESS NOTE   SUBJECTIVE (INTERVAL HISTORY) Her daughter and son are at the bedside.  Remains intubated on low dose sedation. Barely open eyes on voice, able to hold both arms against gravity. Family against feeding tubes, and request one way extubation.    OBJECTIVE Temp:  [97.5 F (36.4 C)-98.8 F (37.1 C)] 97.6 F (36.4 C) (08/26 0800) Pulse Rate:  [50-78] 75 (08/26 0803) Cardiac Rhythm: Sinus bradycardia;Normal sinus rhythm (08/25 2000) Resp:  [0-32] 19 (08/26 0803) BP: (108-150)/(47-71) 133/63 (08/26 0800) SpO2:  [100 %] 100 % (08/26 0803) FiO2 (%):  [40 %] 40 % (08/26 0803) Weight:  [80 kg (176 lb 5.9 oz)] 80 kg (176 lb 5.9 oz) (08/26 0407)  CBC:   Recent Labs Lab 01/28/16 0350 01/29/16 0356  WBC 7.1 9.2  NEUTROABS 5.9 8.0*  HGB 12.2 12.5  HCT 36.8 39.4  MCV 92.5 94.5  PLT 113* 135*    Basic Metabolic Panel:   Recent Labs Lab 01/28/16 0350 01/29/16 0356  NA 144 149*  K 3.2* 3.2*  CL 109 111  CO2 24 30  GLUCOSE 124* 119*  BUN 40* 46*  CREATININE 0.86 1.07*  CALCIUM 8.2* 8.6*  MG 2.2 2.3  PHOS 3.0 3.2    Lipid Panel:     Component Value Date/Time   CHOL 115 01/26/2016 0550   TRIG 84 01/26/2016 0550   HDL 25 (L) 01/26/2016 0550   CHOLHDL 4.6 01/26/2016 0550   VLDL 17 01/26/2016 0550   LDLCALC 73 01/26/2016 0550   HgbA1c:  Lab Results  Component Value Date   HGBA1C 5.7 (H) 01/26/2016     IMAGING I have personally reviewed the radiological images below and agree with the radiology interpretations.  Mr Brain Wo Contrast 01/27/2016 Large acute infarction involving the left inferior division of MCA distribution.  Few small foci of susceptibility hypointensity are compatible with petechial hemorrhage.  Mild associated mass effect with 4 mm of left-to-right midline shift.   Ct Head Wo Contrast  01/04/2016 IMPRESSION: Suspected small LEFT MCA territory nonhemorrhagic infarct. Stable involutional changes and mild chronic small  vessel ischemic disease.  02/05/16 IMPRESSION: Head CT: Atrophy and chronic small vessel ischemic change. No acute or traumatic finding. CT cervical spine: No acute or traumatic finding. Mild degenerative changes.   Ct Angio head and Neck W Or Wo Contrast 01/31/2016 IMPRESSION: 1. Positive for emergent large vessel occlusion at the left M1 segment. This was discussed with Dr. Hilda Blades at 1054 hours. Intermediate level collateral enhancement in the left MCA branches. 2. Stable CT appearance of the brain from the initial scan this morning. No acute left MCA ischemia evident by CT. 3. Superimposed severe atherosclerotic stenoses of the right ICA siphon (anterior genu), left ACA A2 segment, and left PCA (with poor left PCA branch enhancement). 4. Up to moderate atherosclerotic stenosis of the non dominant distal right vertebral artery, and basilar artery. 5. High-grade stenosis of the right subclavian artery origin (75%). 6. Large right and moderate to large left layering pleural effusions.   IR cerebral angio S/P Lt Common carotid arteriogram,follwed by complete revascularization of occluded LT MCA M1 with x 1 pass with solitaire FR 4mm x40 mm retrieval device achieving a TICI 3 reperfusion. Also used 5.4 mg of IA integrelin superselectively  TTE - - Compared to a prior echo in 2016, there has been little change.   There is mild aortic stenosis with AVA of around 1.7 cm2. LVEF is   60-65%.   PHYSICAL  EXAM  Temp:  [97.5 F (36.4 C)-98.8 F (37.1 C)] 97.6 F (36.4 C) (08/26 0800) Pulse Rate:  [50-78] 75 (08/26 1138) Resp:  [0-32] 26 (08/26 1138) BP: (108-150)/(41-71) 130/41 (08/26 1100) SpO2:  [100 %] 100 % (08/26 1138) FiO2 (%):  [40 %] 40 % (08/26 0803) Weight:  [176 lb 5.9 oz (80 kg)] 176 lb 5.9 oz (80 kg) (08/26 0407)  General - Well nourished, well developed, intubated on precedex and fentanyl.  Ophthalmologic - Fundi not visualized due to noncooperation.  Cardiovascular -  irregularly irregular heart rate and rhythm, and bradycardia.  Neuro - intubated and sedated on ventilator. Barely open eyes on voice and pain. Not following commands. Does not blink to threat. PERRL, positive corneal and gag. Moves LUE spontaneously and able to hold against gravity. RUE 0/5. Withdraw to pain at BLEs at least 3-/5. Bulk was normal and fasciculations were absent. DTR 1+ and no babinski.   ASSESSMENT/PLAN [name] is a 82 y.o. female with history of CAD s/p DES in 2014, DM2, diastolic dysfunction, HLD, HTN, Hypothyroidism, atrial fibrillation not on anticoagulation due to falls presenting with left gaze deviation, right facial droop, R extremity weakness, does not follow verbal commands.    Stroke:  Left MCA infarct embolic secondary to atrial fibrillation s/p mechanical embolectomy  Resultant  R extremity weakness, aphasia  MRI 8/24 Large acute infarction involving left inferior division of MCA distribution. Few small petechial hemorrhage.   CTA head and neck - left M1 occlusion, diffuse athero right ICA siphon, left A2 and left PCA  IR angio - left M1 revascularization   2D Echo  60-65% EF  LDL 73  HgbA1c 5.7  Lovenox for VTE prophylaxis Diet NPO time specified  aspirin 81 mg daily prior to admission, now on No antithrombotic, due to no PO access and pt on comfort measures as per family  Disposition: one way extubation with possible comfort care.  Atrial Fibrillation  No full anticoagulation due to recurrent falls  Rate controlled  Continue IV metoprolol prn  Hypertension  Stable  Goal SBP <160  Continue Lasix  Off cardene gtt  IV Hydralazine, Metoprolol prn  Long-term BP goal normotensive  Hyperlipidemia  LDL 73, goal < 70  Hold off statin for now due to possible comfort care  Other Stroke Risk Factors  Advanced age  Obesity, Body mass index is 30.27 kg/m., recommend weight loss, diet and exercise as appropriate   Coronary artery  disease  Other Active Problems  Diastolic dysfunction: IV Lasix 40mg  q6hr  Hip Fracture s/p repair  Hypothyroidism, on Synthroid  Hypokalemia - 3.2  Renal insufficiency  Hospital day # 7   This patient is critically ill due to left MCA infarct, A. fib with RVR, respiratory failure and at significant risk of neurological worsening, death form infarct extension, heart failure, brain herniation, hemorrhagic transformation. This patient's care requires constant monitoring of vital signs, hemodynamics, respiratory and cardiac monitoring, review of multiple databases, neurological assessment, discussion with family, other specialists and medical decision making of high complexity. I spent 40 minutes of neurocritical care time in the care of this patient.  Jacqueline PlanJindong Elma Shands, MD PhD Stroke Neurology 01/29/2016 6:26 PM   To contact Stroke Continuity provider, please refer to [email or web address]. After hours, contact General Neurology

## 2016-01-29 NOTE — Progress Notes (Signed)
Patient ID: Norval GableMary E Hanratty, female   DOB: 04/06/1917, 82 y.o.   MRN: [id number] No acute changes.  Right hip dressing changed at the bedside.  Incision clean and dry.  New dressing applied.

## 2016-01-29 NOTE — Progress Notes (Signed)
SLP Cancellation Note  Patient Details Name: Jacqueline [name] MRN: [id number] DOB: 12/09/1916   Cancelled treatment:       Reason Eval/Treat Not Completed: Patient's level of consciousness (patient had eyes open and was awake, but not alert enough for SLE)  Angela NevinJohn T. Zaydee Aina, MA, CCC-SLP 01/29/16 4:14 PM

## 2016-01-29 NOTE — Procedures (Signed)
Extubation Procedure Note  Patient Details:   Name: Jacqueline GableMary E [name] DOB: 05/11/1917 MRN: [id number]   Airway Documentation:     Evaluation Pt extubated per MD order at this time, placed on 4L [place].    Cherylin MylarDoyle, Hugo Lybrand 01/29/2016, 11:18 AM

## 2016-01-29 NOTE — Progress Notes (Signed)
Chaplain responded to family request for a visit. Pt's son and daughter were present and talking with Dr. Molli KnockYacoub about upcoming removal of breathing tube. Afterwards at their request we had prayer together. Pt seemed to be mouthing the words of Psalm 23 and the Lord's Prayer along with us. Later pt's grandson and his girlfriend came in and requested another prayer, and we did so. There was also much supportive and empathic conversation with pt's son and daughter. Family seems to be at peace.

## 2016-01-29 NOTE — Progress Notes (Signed)
CDS referral made.  Referral number [id number].  Call back for TOD, brain death testing to be performed, or if GCS <5 for further consulting.

## 2016-01-30 DIAGNOSIS — Z515 Encounter for palliative care: Secondary | ICD-10-CM

## 2016-01-30 LAB — CBC WITH DIFFERENTIAL/PLATELET
Basophils Absolute: 0 10*3/uL (ref 0.0–0.1)
Basophils Relative: 0 %
EOS ABS: 0 10*3/uL (ref 0.0–0.7)
EOS PCT: 0 %
HCT: 43.8 % (ref 36.0–46.0)
HEMOGLOBIN: 13.7 g/dL (ref 12.0–15.0)
LYMPHS ABS: 1.4 10*3/uL (ref 0.7–4.0)
LYMPHS PCT: 9 %
MCH: 30.2 pg (ref 26.0–34.0)
MCHC: 31.3 g/dL (ref 30.0–36.0)
MCV: 96.7 fL (ref 78.0–100.0)
MONOS PCT: 12 %
Monocytes Absolute: 1.8 10*3/uL — ABNORMAL HIGH (ref 0.1–1.0)
Neutro Abs: 12.3 10*3/uL — ABNORMAL HIGH (ref 1.7–7.7)
Neutrophils Relative %: 79 %
PLATELETS: 177 10*3/uL (ref 150–400)
RBC: 4.53 MIL/uL (ref 3.87–5.11)
RDW: 14.5 % (ref 11.5–15.5)
WBC: 15.6 10*3/uL — AB (ref 4.0–10.5)

## 2016-01-30 LAB — RENAL FUNCTION PANEL
ANION GAP: 9 (ref 5–15)
Albumin: 2.4 g/dL — ABNORMAL LOW (ref 3.5–5.0)
BUN: 63 mg/dL — ABNORMAL HIGH (ref 6–20)
CHLORIDE: 115 mmol/L — AB (ref 101–111)
CO2: 27 mmol/L (ref 22–32)
Calcium: 8.6 mg/dL — ABNORMAL LOW (ref 8.9–10.3)
Creatinine, Ser: 1.45 mg/dL — ABNORMAL HIGH (ref 0.44–1.00)
GFR calc non Af Amer: 29 mL/min — ABNORMAL LOW (ref >60)
GFR, EST AFRICAN AMERICAN: 34 mL/min — AB (ref >60)
GLUCOSE: 92 mg/dL (ref 65–99)
Phosphorus: 4.5 mg/dL (ref 2.5–4.6)
Potassium: 3.8 mmol/L (ref 3.5–5.1)
Sodium: 151 mmol/L — ABNORMAL HIGH (ref 135–145)

## 2016-01-30 LAB — MAGNESIUM: Magnesium: 2.4 mg/dL (ref 1.7–2.4)

## 2016-01-30 MED ORDER — SCOPOLAMINE 1 MG/3DAYS TD PT72
1.0000 | MEDICATED_PATCH | TRANSDERMAL | Status: DC | PRN
  Administered 2016-01-30: 1.5 mg via TRANSDERMAL
  Filled 2016-01-30 (×2): qty 1

## 2016-02-01 ENCOUNTER — Encounter (HOSPITAL_COMMUNITY): Payer: Self-pay | Admitting: Radiology

## 2016-02-04 NOTE — Progress Notes (Signed)
STROKE TEAM PROGRESS NOTE   SUBJECTIVE (INTERVAL HISTORY) No family is at the bedside. One way extubation yesterday. Currently comfort care measures. Will transfer to 6N.    OBJECTIVE Temp:  [97.7 F (36.5 C)-98.3 F (36.8 C)] 97.7 F (36.5 C) (08/27 0400) Pulse Rate:  [57-102] 93 (08/27 0730) Cardiac Rhythm: Atrial fibrillation (08/27 0730) Resp:  [0-36] 13 (08/27 0730) BP: (84-130)/(39-59) 84/51 (08/27 0730) SpO2:  [80 %-100 %] 82 % (08/27 0730)  CBC:   Recent Labs Lab 01/29/16 0356 01/07/2016 0325  WBC 9.2 15.6*  NEUTROABS 8.0* 12.3*  HGB 12.5 13.7  HCT 39.4 43.8  MCV 94.5 96.7  PLT 135* 177    Basic Metabolic Panel:   Recent Labs Lab 01/29/16 0356 02/02/2016 0325  NA 149* 151*  K 3.2* 3.8  CL 111 115*  CO2 30 27  GLUCOSE 119* 92  BUN 46* 63*  CREATININE 1.07* 1.45*  CALCIUM 8.6* 8.6*  MG 2.3 2.4  PHOS 3.2 4.5    Lipid Panel:     Component Value Date/Time   CHOL 115 01/26/2016 0550   TRIG 84 01/26/2016 0550   HDL 25 (L) 01/26/2016 0550   CHOLHDL 4.6 01/26/2016 0550   VLDL 17 01/26/2016 0550   LDLCALC 73 01/26/2016 0550   HgbA1c:  Lab Results  Component Value Date   HGBA1C 5.7 (H) 01/26/2016     IMAGING I have personally reviewed the radiological images below and agree with the radiology interpretations.  Mr Brain Wo Contrast 01/27/2016 Large acute infarction involving the left inferior division of MCA distribution.  Few small foci of susceptibility hypointensity are compatible with petechial hemorrhage.  Mild associated mass effect with 4 mm of left-to-right midline shift.   Ct Head Wo Contrast  01/21/2016 IMPRESSION: Suspected small LEFT MCA territory nonhemorrhagic infarct. Stable involutional changes and mild chronic small vessel ischemic disease.  January 25, 2016 IMPRESSION: Head CT: Atrophy and chronic small vessel ischemic change. No acute or traumatic finding. CT cervical spine: No acute or traumatic finding. Mild degenerative changes.    Ct Angio head and Neck W Or Wo Contrast 01/28/2016 IMPRESSION: 1. Positive for emergent large vessel occlusion at the left M1 segment. This was discussed with Dr. Hilda Blades at 1054 hours. Intermediate level collateral enhancement in the left MCA branches. 2. Stable CT appearance of the brain from the initial scan this morning. No acute left MCA ischemia evident by CT. 3. Superimposed severe atherosclerotic stenoses of the right ICA siphon (anterior genu), left ACA A2 segment, and left PCA (with poor left PCA branch enhancement). 4. Up to moderate atherosclerotic stenosis of the non dominant distal right vertebral artery, and basilar artery. 5. High-grade stenosis of the right subclavian artery origin (75%). 6. Large right and moderate to large left layering pleural effusions.   IR cerebral angio S/P Lt Common carotid arteriogram,follwed by complete revascularization of occluded LT MCA M1 with x 1 pass with solitaire FR 4mm x40 mm retrieval device achieving a TICI 3 reperfusion. Also used 5.4 mg of IA integrelin superselectively  TTE - - Compared to a prior echo in 2016, there has been little change.   There is mild aortic stenosis with AVA of around 1.7 cm2. LVEF is   60-65%.   PHYSICAL EXAM - limited due to comfort care  Temp:  [97.7 F (36.5 C)-98.3 F (36.8 C)] 97.7 F (36.5 C) (08/27 0400) Pulse Rate:  [57-102] 93 (08/27 0730) Resp:  [0-36] 13 (08/27 0730) BP: (84-130)/(39-59) 84/51 (08/27 0730) SpO2:  [80 %-  100 %] 82 % (08/27 0730)  General - Well nourished, well developed, not in acute distress.  Ophthalmologic - Fundi not visualized due to noncooperation.  Cardiovascular - irregularly irregular heart rate and rhythm.  Neuro - extubated and on morphine drip. Not open eyes on voice. Not following commands. No spontaneous movement with mouth cleaning. Did not move extremities spontaneously. Bulk was normal and fasciculations were absent.   ASSESSMENT/PLAN Jacqueline [name] is a  82 y.o. female with history of CAD s/p DES in 2014, DM2, diastolic dysfunction, HLD, HTN, Hypothyroidism, atrial fibrillation not on anticoagulation due to falls presenting with left gaze deviation, right facial droop, R extremity weakness, does not follow verbal commands.    Stroke:  Left MCA infarct embolic secondary to atrial fibrillation s/p mechanical embolectomy  Resultant  R extremity weakness, aphasia  MRI 8/24 Large acute infarction involving left inferior division of MCA distribution. Few small petechial hemorrhage.   CTA head and neck - left M1 occlusion, diffuse athero right ICA siphon, left A2 and left PCA  IR angio - left M1 revascularization   2D Echo  60-65% EF  LDL 73  HgbA1c 5.7  Lovenox for VTE prophylaxis Diet NPO time specified  aspirin 81 mg daily prior to admission, now on No antithrombotic, due to no PO access and pt on comfort measures as per family  Disposition: one way extubation and comfort care. Transfer to 6N  Atrial Fibrillation  No full anticoagulation due to recurrent falls  Not on antithrombotic now due to comfort care measures.  Rate controlled so far  Hypertension  Stable  Continue to monitor  Hyperlipidemia  LDL 73, goal < 70  Hold off statin for now due to possible comfort care  Other Stroke Risk Factors  Advanced age  Obesity, Body mass index is 30.27 kg/m., recommend weight loss, diet and exercise as appropriate   Coronary artery disease  Other Active Problems  Diastolic dysfunction  Hip Fracture s/p repair  Hypothyroidism  Hypokalemia   Renal insufficiency  Hospital day # 8    Marvel PlanJindong Mychaela Lennartz, MD PhD Stroke Neurology 01/10/2016 8:03 AM   To contact Stroke Continuity provider, please refer to [email or web address]. After hours, contact General Neurology

## 2016-02-04 NOTE — Progress Notes (Addendum)
Received order to terminally extubate patient on 01/29/16. Pt was extubated per protocol and comfort measures were provided to the patient and family. Morphine was infusing and a scopalamine patch was applied behind pt's left ear. RN transported the patient at 1205. At 1212 the pt was noted to be apneic. This RN and Fayrene FearingLaura Turner, RN independently auscultated for heart and lung sounds which were determined to be absent. Time of death was declared at 1212. Dr. Roda ShuttersXu of Neurology was notified and acknowledged. CDS was notified with time of death and pt is not a candidate for donation. Al Decantick Miller (medical examiner) was notified and confirmed that pt is an ME case.

## 2016-02-04 NOTE — Progress Notes (Signed)
PULMONARY / CRITICAL CARE MEDICINE   Name: Jacqueline [name] MRN: [id number] DOB: 06-13-1916    ADMISSION DATE:  02/01/2016 CONSULTATION DATE:  01/29/2016  REFERRING MD:  Dr. Corliss Skains  CHIEF COMPLAINT: Left M-1 segment occlussion  HISTORY OF PRESENT ILLNESS:  82 year old female with PMH of DM, chronic AF not on anticoagulation secondary to chronic falls (off eliquis 6 months ago), CAD, AS, Diastolic HF, HTN, HLD, hypothyroidism admitted 8/19 after a mechanical fall..  She is a resident at assisted living and is fairly active.  Unfortunately she suffered a mechanical fall on 8/19 and fractured her right femoral neck.  Pre-op she was seen by Cardiology for clearance and given high risk given PMH and age.  Surgery was postponed secondary to development of pulmonary edema and hypoxia on the 8/20 which improved with lasix and oxygen.  The patient underwent a right hemi-arthroplasty on the evening of 8/21 and was extubated without complications. She was placed on lovenox for DVT coverage.  On the morning of 8/22 she was LKW around 0830, then later found with left gaze, right hemiplegia, right facial droop, right hemianopia, and NIHSS of 28.  CTA showed occlusion of left M-1 segment of MCA.  She was intubated and underwent cerebral angiogram with complete revascularization.  The patient remained intubated post-procedure and transferred to Neuro ICU.  PCCM consulted for further ventilator and ICU management.    SUBJECTIVE:  No events overnight, appears comfortable on a morphine drip.  VITAL SIGNS: BP (!) 84/51 (BP Location: Left Arm)   Pulse 93   Temp 97.5 F (36.4 C) (Oral)   Resp 13   Ht 5\' 4"  (1.626 m)   Wt 80 kg (176 lb 5.9 oz)   LMP  (LMP Unknown)   SpO2 (!) 82%   BMI 30.27 kg/m   HEMODYNAMICS:    VENTILATOR SETTINGS:    INTAKE / OUTPUT: I/O last 3 completed shifts: In: 1359.9 [I.V.:1359.9] Out: 1460 [Urine:1460]  PHYSICAL EXAMINATION: General:  Elderly female. Eyes closed. Family  at bedside.  Neuro: Pupils pinpoint and symmetric. Spontaneous movement of both arms, not legs. HEENT:  Endotracheal tube in place. No scleral injection. Moist mucous membranes.  Cardiovascular:  Regular rate with a regular rhythm. No appreciable JVD. No edema.  Lungs:  Coarse breath sounds bilaterally unchanged. Symmetric chest wall rise on ventilator.  Abdomen:  Soft. Nondistended. Normal bowel sounds.  Musculoskeletal:  No acute deformities.  Dressing to right anterior hip clean, dry, and intact.  Skin:  Warm and dry. No rash on exposed skin.   LABS:  BMET  Recent Labs Lab 01/28/16 0350 01/29/16 0356 01/24/2016 0325  NA 144 149* 151*  K 3.2* 3.2* 3.8  CL 109 111 115*  CO2 24 30 27   BUN 40* 46* 63*  CREATININE 0.86 1.07* 1.45*  GLUCOSE 124* 119* 92    Electrolytes  Recent Labs Lab 01/28/16 0350 01/29/16 0356 01/11/2016 0325  CALCIUM 8.2* 8.6* 8.6*  MG 2.2 2.3 2.4  PHOS 3.0 3.2 4.5    CBC  Recent Labs Lab 01/28/16 0350 01/29/16 0356 01/10/2016 0325  WBC 7.1 9.2 15.6*  HGB 12.2 12.5 13.7  HCT 36.8 39.4 43.8  PLT 113* 135* 177    Coag's  Recent Labs Lab 01/13/2016 1125  INR 1.34    Sepsis Markers No results for input(s): LATICACIDVEN, PROCALCITON, O2SATVEN in the last 168 hours.  ABG  Recent Labs Lab 01/05/2016 1653 01/26/16 0230  PHART 7.375 7.385  PCO2ART 44.5 44.8  PO2ART  79.0* 111*    Liver Enzymes  Recent Labs Lab 01/07/2016 0303  01/28/16 0350 01/29/16 0356 2016-03-27 0325  AST 21  --   --   --   --   ALT 16  --   --   --   --   ALKPHOS 79  --   --   --   --   BILITOT 1.2  --   --   --   --   ALBUMIN 3.1*  < > 2.3* 2.2* 2.4*  < > = values in this interval not displayed.  Cardiac Enzymes No results for input(s): TROPONINI, PROBNP in the last 168 hours.  Glucose  Recent Labs Lab 01/28/16 1140 01/28/16 1603 01/28/16 2018 01/28/16 2314 01/29/16 0346 01/29/16 0757  GLUCAP 118* 114* 103* 112* 107* 113*    Imaging No results  found.   STUDIES:  CT C-Spine 8/19: no acute findings; mild degenerative changes CT Head 8/19: atrophy and chronic small vessel ischemic change; no acute/traumatic finding TTE 8/21: mod LVH; severe focal basal hypertrophy of the septum; LVEF 60-65%; normal wall motion, no regional wall motion abnormalities; mild AS with trivial AR; MV with calcified annulus and mildly thickened leaflets, LA mod dilated, RA severely dilated; mod TR, PAS 53 mmHg CTA Head/Neck 8/22: large occlusion of left M1 segment,  Severe atherosclerotic stenosis of right ICA siphon, left ACA A2 segment, and left PCA with poor branch enhancement; moderate atherosclerotic stenosis of the non dominant distal right vertebral artery and basilar artery; high grade stenosis of RCA origin; large right and mod to large left layering pleural efusions CT Head 8/22 (post intervention): suspected small left MCA territory nonhemorrhagic infarct; stable involutional changes and mild chronic small vessel ischemic disease Port CXR 8/23: Bilateral lower lung opacities. Endotracheal tube in good position. MRI 8/24: Large acute infarction involving left inferior division of MCA distribution. Small foci of susceptibility hypointensity are compatible with petechial hemorrhage. Mild associated mass effect with 4 mm left-to-right midline shift.   MICROBIOLOGY: MRSA PCR 8/20:  Negative   ANTIBIOTICS: Ancef 8/21 Periop PPx  SIGNIFICANT EVENTS: 8/19 -  Admit after fall with R fem neck fx 8/20 -  Pulmonary edema, hypoxia that improved with lasix / O2 8/21 -  OR for R anterior ORIF 8/22 -  L M1 CVA, to neuro IR for revascularization   LINES/TUBES: R fem sheath 8/22 - 8/23 OETT 7.5 8/22 >>8/26 L Radial Art Line 8/22 >> Foley >> PIV x2  I reviewed CXR myself, pulmonary edema.  ASSESSMENT / PLAN:  NEUROLOGIC A:   L M-1 CVA - S/P Revascularization 8/22. Sedation on Ventilator  P:   Fentanyl drip for comfort. D/C further neuro  checks.  PULMONARY A: Acute Hypoxic Respiratory Failure - Multifactorial w/ acute CVA, AMS, & Probable Pulmonary Edema. Bilateral Pleural Effusions  P:   O2 for comfort only.  CARDIOVASCULAR A:  PAF - Not on anticoagulation due to increased fall risk  HTN H/O MildAortic Stenosis H/O HLD H/O Diastolic Dysfunction  H/O CAD  P:  D/C tele.  RENAL A:   Hypokalemia - Replacing.  P:   D/C blood draws.  GASTROINTESTINAL A:   Constipation   P:   NPO  HEMATOLOGIC A:   Thrombocytopenia - Mild. Count stable. Leukocytosis - Resolved.  P:  D/C further blood draws.  INFECTIOUS A:   No acute infection.  P:   Monitor.  ENDOCRINE A:   H/O DM - Glucose controlled. A1c 5.6. H/O Hypothyroidism  P:   D/C CBG and ISS.  ORTHOPEDIC A: S/P R Hip Arthroplasty  P: D/C PT  FAMILY  - Updates: Family updated bedside.  - Inter-disciplinary family meet or Palliative Care meeting due by:  8/29  Discussed with bedside RN, PCCM will sign off, please call back if needed.  Alyson Reedy, M.D. [facility] Pulmonary/Critical Care Medicine. Pager: [phone]. After hours pager: [phone].

## 2016-02-04 NOTE — Progress Notes (Signed)
Morphine drip 135ml wasted down sink. Witness by Kirtland BouchardJill Moore RN. Barbera Settersurner, Zakhai Meisinger B RN

## 2016-02-04 NOTE — Discharge Summary (Signed)
Stroke Discharge Summary  Patient ID: Jacqueline [name]   MRN: [id number]      DOB: [birth year]  Date of Admission: 02-10-16 Date of Discharge: 02/02/2016  Attending Physician:  No att. providers found, Stroke MD Consulting Physician(s):   Treatment Team:  Kathryne Hitch, MD cardiology, pulmonary/intensive care, orthopedic surgery and Interventional Radiology  Hillis Range MD; Koren Bound MD; Corrie Mckusick MD; and Julieanne Cotton MD Patient's PCP:  Pamelia Hoit, MD  DISCHARGE DIAGNOSIS: Deceased - Stroke - Large acute infarction involving the left inferior division of the MCA distribution. Active Problems:   Essential hypertension   CAD (coronary artery disease)   Chronic atrial fibrillation (HCC)   Pleural effusion   Hypoxia   Acute on chronic diastolic (congestive) heart failure (HCC)   Moderate pulmonary hypertension (HCC)   Closed displaced fracture of right femoral neck (HCC)   Closed right hip fracture (HCC)   Atrial fibrillation with RVR (HCC)   Stroke due to embolism of left middle cerebral artery (HCC)   Palliative care encounter  BMI: Body mass index is 30.27 kg/m.  Past Medical History:  Diagnosis Date  . Bilateral leg weakness    chronic  . CAD (coronary artery disease)    a. 2-08/2012: Cath/PCI: LM 10-20d, LAD 60-70p, 75m (Rotablator->2.5x32 Promus Premier DES), D1 70-80ost, D2- small-90 ost/95p, LCX large, OM1 40p, OM2 small 80-90, RCA dominant 34m, Ef 65-70%.  . Diabetes mellitus   . Diastolic dysfunction   . Gout   . Hyperlipidemia   . Hypertension   . Hypothyroidism   . Mild aortic stenosis    a. 01/2010 Echo: Nl EF, mild AS/AI, mild LAE, mild PAH.  Marland Kitchen Permanent atrial fibrillation (HCC)    chads2vasc score is at least 6.  not a candidate for anticoagulation due to falls  . Shortness of breath    with chf & lying flat   Past Surgical History:  Procedure Laterality Date  . Breast biopsies    . CATARACT EXTRACTION    . CORONARY  ANGIOPLASTY WITH STENT PLACEMENT  march 2014  . IR GENERIC HISTORICAL  01/23/2016   IR PERCUTANEOUS ART THROMBECTOMY/INFUSION INTRACRANIAL INC DIAG ANGIO 01/24/2016 Julieanne Cotton, MD [facility]-INTERV RAD  . LEFT HEART CATHETERIZATION WITH CORONARY ANGIOGRAM N/A 08/02/2012   Procedure: LEFT HEART CATHETERIZATION WITH CORONARY ANGIOGRAM;  Surgeon: Peter M Swaziland, MD;  Location: [facility] CATH LAB;  Service: Cardiovascular;  Laterality: N/A;  . Left knee replacement    . PERCUTANEOUS CORONARY ROTOBLATOR INTERVENTION (PCI-R) N/A 08/05/2012   Procedure: PERCUTANEOUS CORONARY ROTOBLATOR INTERVENTION (PCI-R);  Surgeon: Peter M Swaziland, MD;  Location: [facility] CATH LAB;  Service: Cardiovascular;  Laterality: N/A;  . TOTAL HIP ARTHROPLASTY Right 01/07/2016   Procedure: HEMI- ARTHROPLASTY ANTERIOR APPROACH;  Surgeon: Kathryne Hitch, MD;  Location: [facility] OR;  Service: Orthopedics;  Laterality: Right;     LABORATORY STUDIES CBC    Component Value Date/Time   WBC 15.6 (H) 02/01/2016 0325   RBC 4.53 01/11/2016 0325   HGB 13.7 02/03/2016 0325   HCT 43.8 01/06/2016 0325   PLT 177 01/17/2016 0325   MCV 96.7 01/17/2016 0325   MCH 30.2 01/29/2016 0325   MCHC 31.3 01/21/2016 0325   RDW 14.5 01/07/2016 0325   LYMPHSABS 1.4 01/07/2016 0325   MONOABS 1.8 (H) 01/10/2016 0325   EOSABS 0.0 01/29/2016 0325   BASOSABS 0.0 01/08/2016 0325   CMP    Component Value Date/Time   NA 151 (H) 01/21/2016 0325  K 3.8 02-04-2016 0325   CL 115 (H) Feb 04, 2016 0325   CO2 27 02/04/16 0325   GLUCOSE 92 2016-02-04 0325   BUN 63 (H) 02-04-2016 0325   CREATININE 1.45 (H) 02-04-16 0325   CREATININE 1.24 (H) 08/13/2014 1517   CALCIUM 8.6 (L) 02/04/2016 0325   PROT 5.8 (L) 01/04/2016 0303   ALBUMIN 2.4 (L) 2016-02-04 0325   AST 21 01/18/2016 0303   ALT 16 01/09/2016 0303   ALKPHOS 79 01/21/2016 0303   BILITOT 1.2 01/13/2016 0303   GFRNONAA 29 (L) 02-04-2016 0325   GFRAA 34 (L) 02-04-2016 0325   COAGS Lab Results  Component  Value Date   INR 1.34 01/04/2016   INR 1.40 04/17/2015   INR 1.37 04/16/2015   Lipid Panel    Component Value Date/Time   CHOL 115 01/26/2016 0550   TRIG 84 01/26/2016 0550   HDL 25 (L) 01/26/2016 0550   CHOLHDL 4.6 01/26/2016 0550   VLDL 17 01/26/2016 0550   LDLCALC 73 01/26/2016 0550   HgbA1C  Lab Results  Component Value Date   HGBA1C 5.7 (H) 01/26/2016   Cardiac Panel (last 3 results) No results for input(s): CKTOTAL, CKMB, TROPONINI, RELINDX in the last 72 hours. Urinalysis    Component Value Date/Time   COLORURINE YELLOW 01/10/2016 0005   APPEARANCEUR CLEAR 01/18/2016 0005   LABSPEC 1.017 01/17/2016 0005   PHURINE 5.5 01/15/2016 0005   GLUCOSEU NEGATIVE 01/18/2016 0005   HGBUR NEGATIVE 01/05/2016 0005   BILIRUBINUR NEGATIVE 02/01/2016 0005   KETONESUR NEGATIVE 01/09/2016 0005   PROTEINUR NEGATIVE 01/29/2016 0005   UROBILINOGEN 0.2 04/16/2015 1939   NITRITE NEGATIVE 01/21/2016 0005   LEUKOCYTESUR SMALL (A) 02/01/2016 0005   Urine Drug Screen No results found for: LABOPIA, COCAINSCRNUR, LABBENZ, AMPHETMU, THCU, LABBARB  Alcohol Level No results found for: [facility]   SIGNIFICANT DIAGNOSTIC STUDIES I have personally reviewed the radiological images below and agree with the radiology interpretations.  Mr Brain Wo Contrast 01/27/2016 Large acute infarction involving the left inferior division of MCA distribution.  Few small foci of susceptibility hypointensity are compatible with petechial hemorrhage.  Mild associated mass effect with 4 mm of left-to-right midline shift.   Ct Head Wo Contrast  01/24/2016 IMPRESSION: Suspected small LEFT MCA territory nonhemorrhagic infarct. Stable involutional changes and mild chronic small vessel ischemic disease.  01/13/2016 IMPRESSION: Head CT: Atrophy and chronic small vessel ischemic change. No acute or traumatic finding. CT cervical spine: No acute or traumatic finding. Mild degenerative changes.   Ct Angio head and Neck W Or  Wo Contrast 01/25/2016 IMPRESSION: 1. Positive for emergent large vessel occlusion at the left M1 segment. This was discussed with Dr. Hilda Blades at 1054 hours. Intermediate level collateral enhancement in the left MCA branches. 2. Stable CT appearance of the brain from the initial scan this morning. No acute left MCA ischemia evident by CT. 3. Superimposed severe atherosclerotic stenoses of the right ICA siphon (anterior genu), left ACA A2 segment, and left PCA (with poor left PCA branch enhancement). 4. Up to moderate atherosclerotic stenosis of the non dominant distal right vertebral artery, and basilar artery. 5. High-grade stenosis of the right subclavian artery origin (75%). 6. Large right and moderate to large left layering pleural effusions.   IR cerebral angio - Percutaneous Arterial Thrombectomy S/P Lt Common carotid arteriogram,follwed by complete revascularization of occluded LT MCA M1 with x 1 pass with solitaire FR 4mm x40 mm retrieval device achieving a TICI 3 reperfusion. Also used 5.4 mg of  IA integrelin superselectively  TTE - - Compared to a prior echo in Oct 15, 2014, there has been little change. There is mild aortic stenosis with AVA of around 1.7 cm2. LVEF is 60-65%.  DG Chest Portable 1 View 01/26/2016 Endotracheal tube as described without apparent pneumothorax. Persistent changes of congestive heart failure.  No airspace consolidation appreciable.    HISTORY OF PRESENT ILLNESS Jacqueline [name] was a 82 y.o. female with a history of atrial fibrillation but was not on anticoagulation secondary to fall risks.  She was a resident at an assisted living facility and was fairly active.  Unfortunately she suffered a mechanical fall on 8/19 and fractured her right femoral neck.  Pre-op she was seen by Cardiology for clearance and was felt to be high risk for surgery given her PMH and age.  Surgery was postponed secondary to the development of pulmonary edema and hypoxia on the 8/20  which improved with lasix and oxygen.  The patient underwent a right hemi-arthroplasty on the evening of 8/21 and was extubated without complications.   HOSPITAL COURSE  Postoperatively she was doing well and was last seen normal at 8:30 AM on 01/07/2016. When the nurse came around again to check on the patient she was noted to have a left gaze deviation, right facial droop, right arm and right leg flaccidity and was unable to communicate.  A code stroke was called and patient had a CT scan which was normal but her NIHSS was 28.  A CTA of head and neck showed a cutoff at the M1 segment of the MCA on the left. The situation was discussed with the patient's family, including risks and benefits, and a decision was made to proceed with an angiogram with possible intervention by Dr. Corliss Skains. The patient was brought directly to interventional radiology where a thrombectomy was performed.   S/P Lt Common carotid arteriogram,follwed by complete revascularization of occluded LT MCA M1 with x 1 pass with solitaire FR 4mm x40 mm retrieval device achieving a TICI 3 reperfusion. Also used 5.4 mg of IA integrelin super selectively.  Date last known well: Date: 01/09/2016 Time last known well: Time: 08:30 tPA Given: No: recent surgery  Following the thrombectomy the patient remained intubated and was admitted to the neuro intensive care unit for close monitoring. On 01/28/2016 Dr Pearlean Brownie discussed with the patient's daughter the poor prognosis given imaging findings and clinical course. He recommended one way extubation with comfort care. The patient's daughter talked with her brother and they decided on comfort care with terminal ventilator weaning and one way extubation. The patient was a full DNR.  On the morning of 01/29/2016 the patient was extubated per order and placed on 4 L/m of nasal cannula oxygen along with a morphine drip. On 02/03/2016 at 1212 the pt was noted to be apneic. Two RNs independently  auscultated for heart and lung sounds which were determined to be absent. Time of death was declared at 10-15-1210. Dr. Roda Shutters of Neurology was notified and acknowledged. CDS was notified with time of death and pt is not a candidate for donation. Jacqueline [name] (medical examiner) was notified and confirmed that pt is an ME case.   Stroke:  Left MCA infarct embolic secondary to atrial fibrillation s/p mechanical embolectomy  Resultant  R extremity weakness, aphasia  MRI 8/24 Large acute infarction involving left inferior division of MCA distribution. Few small petechial hemorrhage.   CTA head and neck - left M1 occlusion, diffuse athero right ICA siphon, left A2  and left PCA  IR angio - left M1 revascularization   2D Echo  60-65% EF  LDL 73  HgbA1c 5.7  Disposition: one way extubation and comfort care.   Atrial Fibrillation  No full anticoagulation due to recurrent falls  Not on antithrombotic now due to comfort care measures.  Rate controlled  Hypertension / Hypotension  Continue to monitor  Hyperlipidemia  LDL 73, goal < 70  Hold off statin for now due to possible comfort care  Other Stroke Risk Factors  Advanced age  Obesity, Body mass index is 30.27 kg/m., recommend weight loss, diet and exercise as appropriate   Coronary artery disease  Other Active Problems  Diastolic dysfunction  Hip Fracture s/p repair  Hypothyroidism  Hypokalemia   Renal insufficiency   Physical exam Patient deceased.   Time of death was declared at 1212 on 01/10/2016.   30 minutes were spent preparing the discharge summary.  Jacqueline Seeavid Rinehuls PA-C [facility] Pager [phone] 01/20/2016, 4:01 PM   I, the attending vascular neurologist, have personally obtained a history, examined the patient, evaluated laboratory data, individually viewed imaging studies and agree with radiology interpretations. Together with the NP/PA, we formulated the assessment and plan of  care which reflects our mutual decision.  I have made any additions or clarifications directly to the above note and agree with the findings and plan as currently documented.   Please refer to my progress note today for details. Patient was on comfort care measures since last night.   Marvel PlanJindong Shahzad Thomann, MD PhD Stroke Neurology 01/12/2016 5:13 PM

## 2016-02-04 NOTE — Progress Notes (Signed)
Witnessed waste of morphine drip by Fayrene FearingLaura Turner, RN.

## 2016-02-04 DEATH — deceased

## 2017-08-05 IMAGING — CT CT HEAD W/O CM
4 series · 16 of 47 positions shown, 18 images · non-contrast
Comparison: CT HEAD January 25, 2016 at [DATE] a.m.

CLINICAL DATA: Follow-up code stroke. Status post revascularization
of occluded LEFT M1 segment.

EXAM:
CT HEAD WITHOUT CONTRAST
TECHNIQUE: Contiguous axial images were obtained from the base of the skull
through the vertex without intravenous contrast.

[Series 2: head without · axial · non-contrast · 0.44mm/px · z∈[-136,-16]mm · 7 of 33 slices shown, 9 images]
[im 5/33  brain]
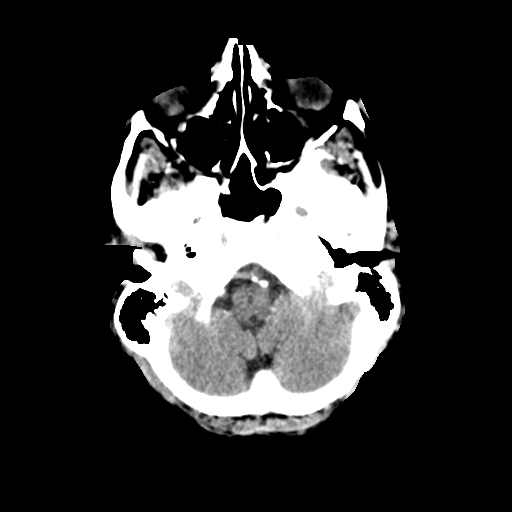
[im 5/33  bone]
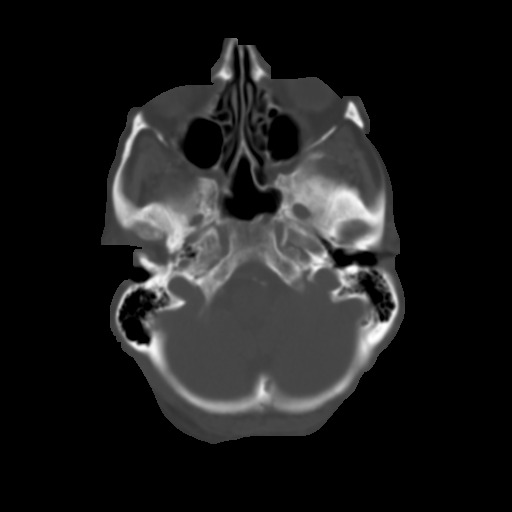
[im 9/33  brain]
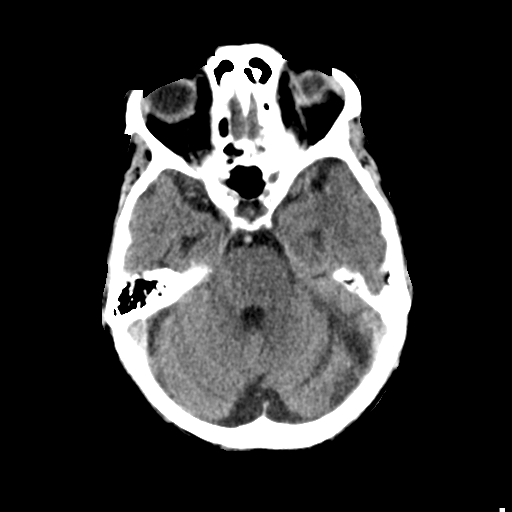
[im 13/33  brain]
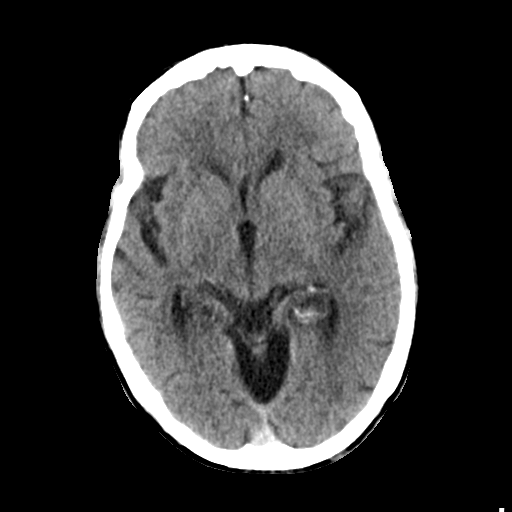
[im 17/33  brain]
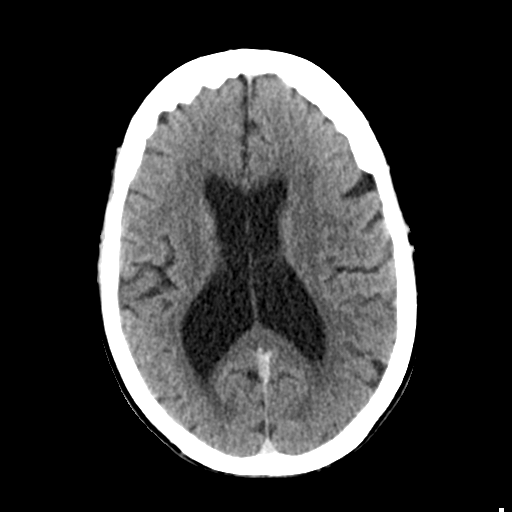
[im 21/33  brain]
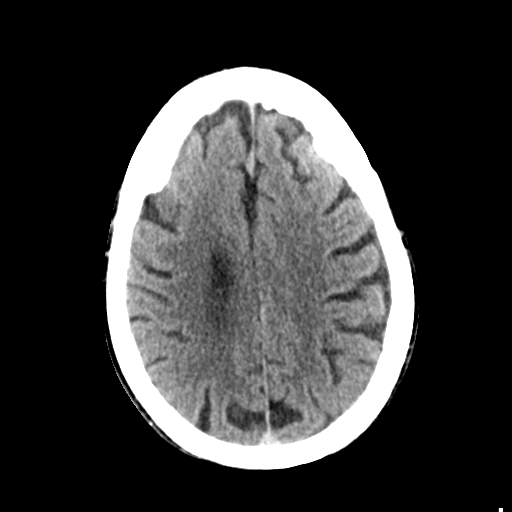
[im 21/33  bone]
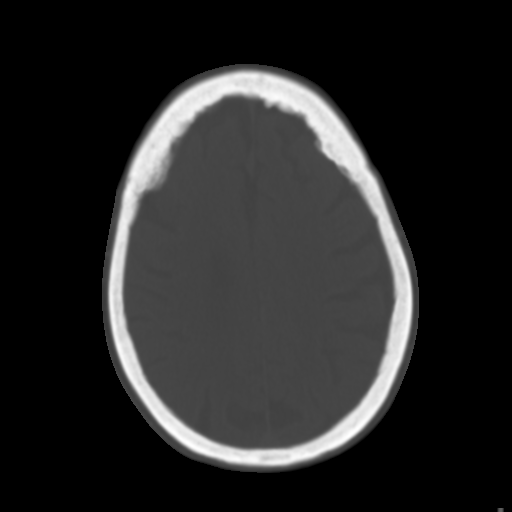
[im 25/33  brain]
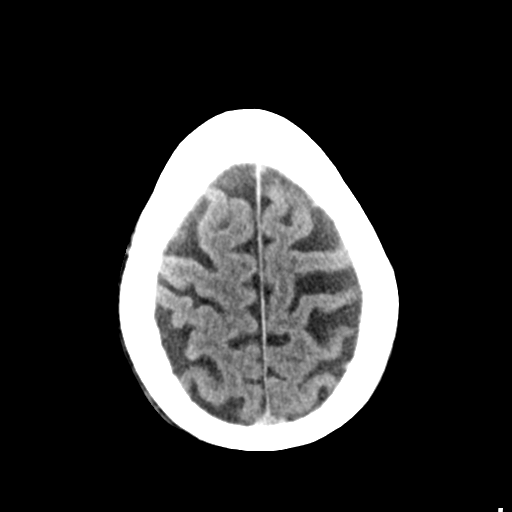
[im 29/33  brain]
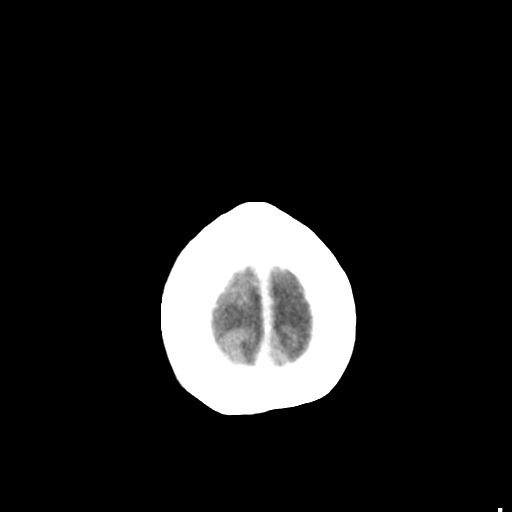

[Series 3: head bone · axial · 0.44mm/px · z∈[-140,-108]mm · 3 of 82 slices shown]
[im 9/82  bone]
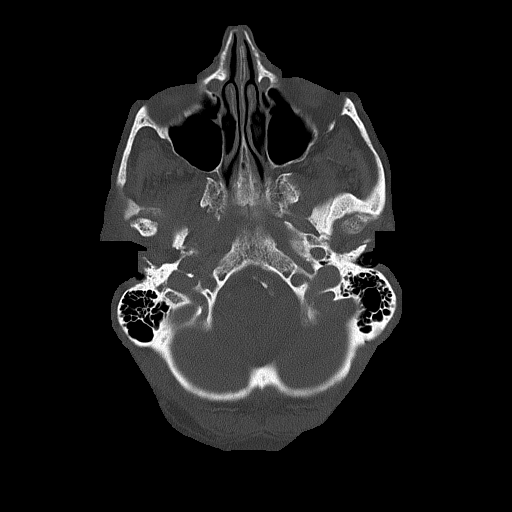
[im 17/82  bone]
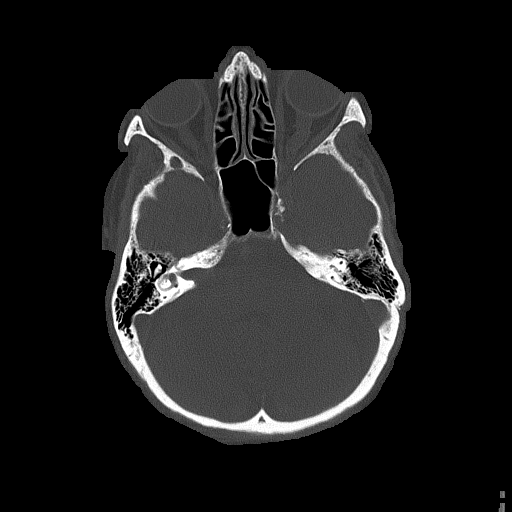
[im 25/82  bone]
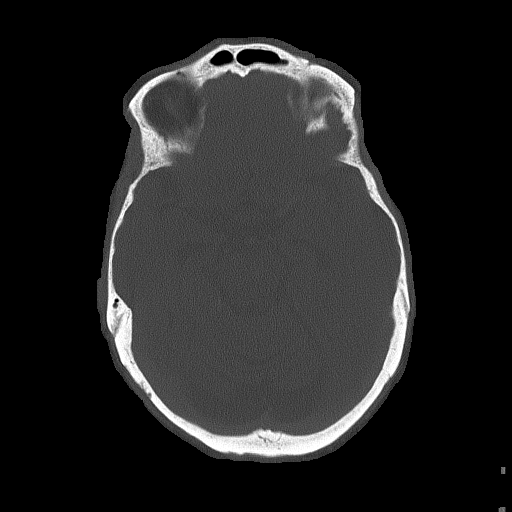

[Series 4: head without cor · coronal · non-contrast · 0.32mm/px · 3 of 71 slices shown]
[im 24/71  brain]
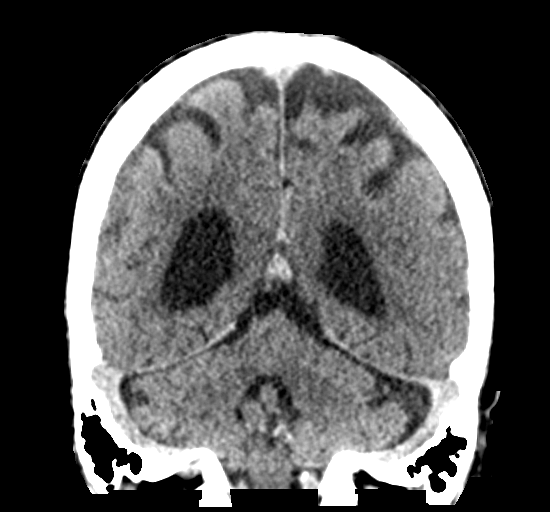
[im 32/71  brain]
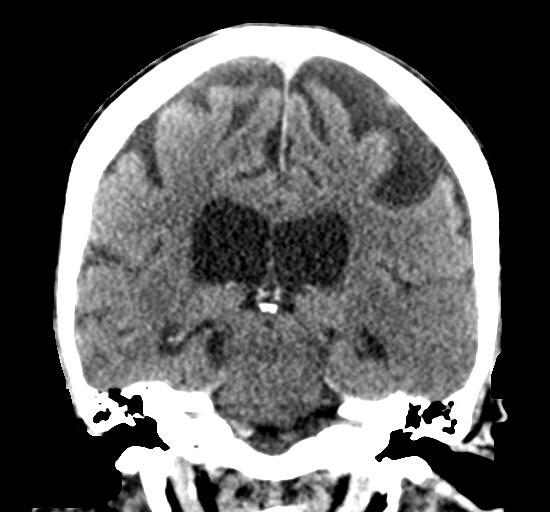
[im 39/71  brain]
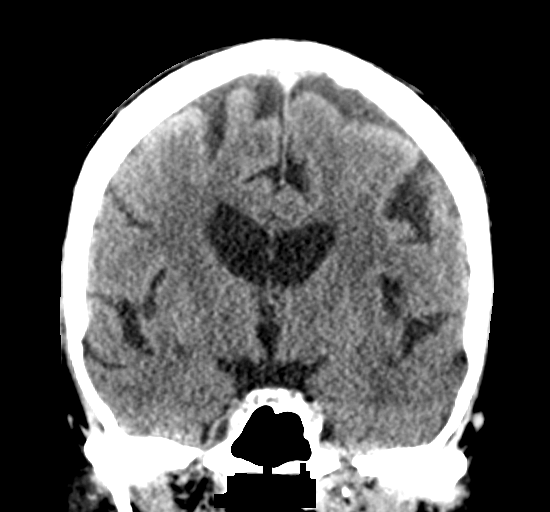

[Series 5: head without sag · sagittal · non-contrast · 0.32mm/px · 3 of 67 slices shown]
[im 23/67  brain]
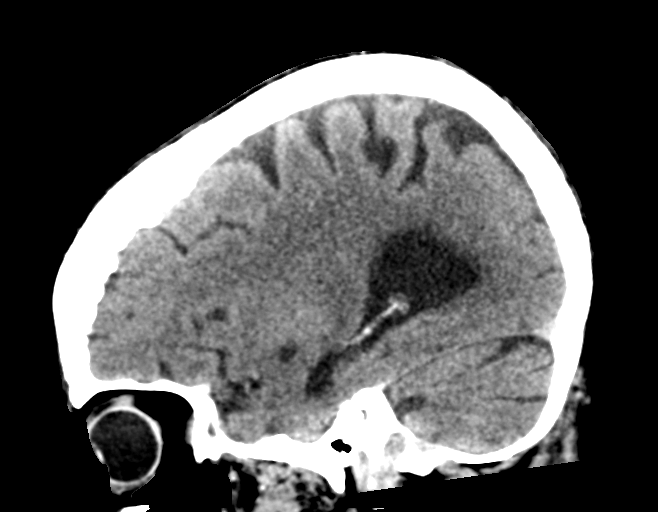
[im 34/67  brain]
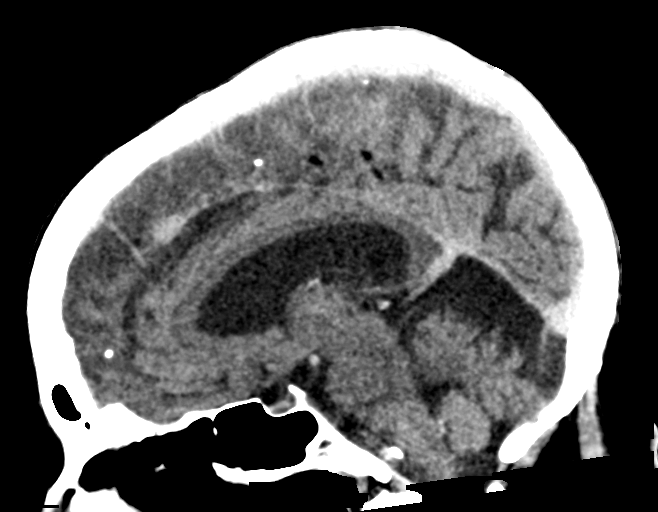
[im 45/67  brain]
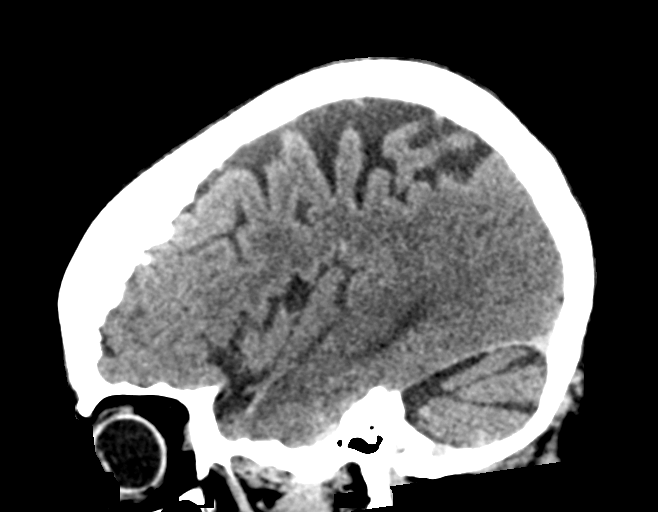

[16 of 47 positions shown; findings below may reference images not displayed]

FINDINGS: BRAIN: The ventricles and sulci are normal for age. No
intraparenchymal hemorrhage, mass effect nor midline shift. Patchy
supratentorial white matter hypodensities less than expected for
patient's age, though non-specific are most compatible with chronic
small vessel ischemic disease. Slight blurring of the posterior
insula gray-white matter differentiation. No abnormal extra-axial
fluid collections. Basal cisterns are patent.

VASCULAR: Moderate calcific atherosclerosis of the carotid siphons.
Mild residual contrast opacification of the intracranial vessels.

SKULL: No skull fracture. No significant scalp soft tissue swelling.
Moderate temporomandibular osteoarthrosis.

SINUSES/ORBITS: The included ocular globes and orbital contents are
non-suspicious. Status post bilateral ocular lens implants. The
mastoid air-cells and included paranasal sinuses are well-aerated.

OTHER: None.
IMPRESSION: Suspected small LEFT MCA territory nonhemorrhagic infarct.

Stable involutional changes and mild chronic small vessel ischemic
disease.

## 2017-08-06 IMAGING — CR DG CHEST 1V PORT
1 series · 1 of 1 positions shown · non-contrast
Comparison: January 25, 2016

CLINICAL DATA: Hypoxia

EXAM:
PORTABLE CHEST 1 VIEW

[portable]
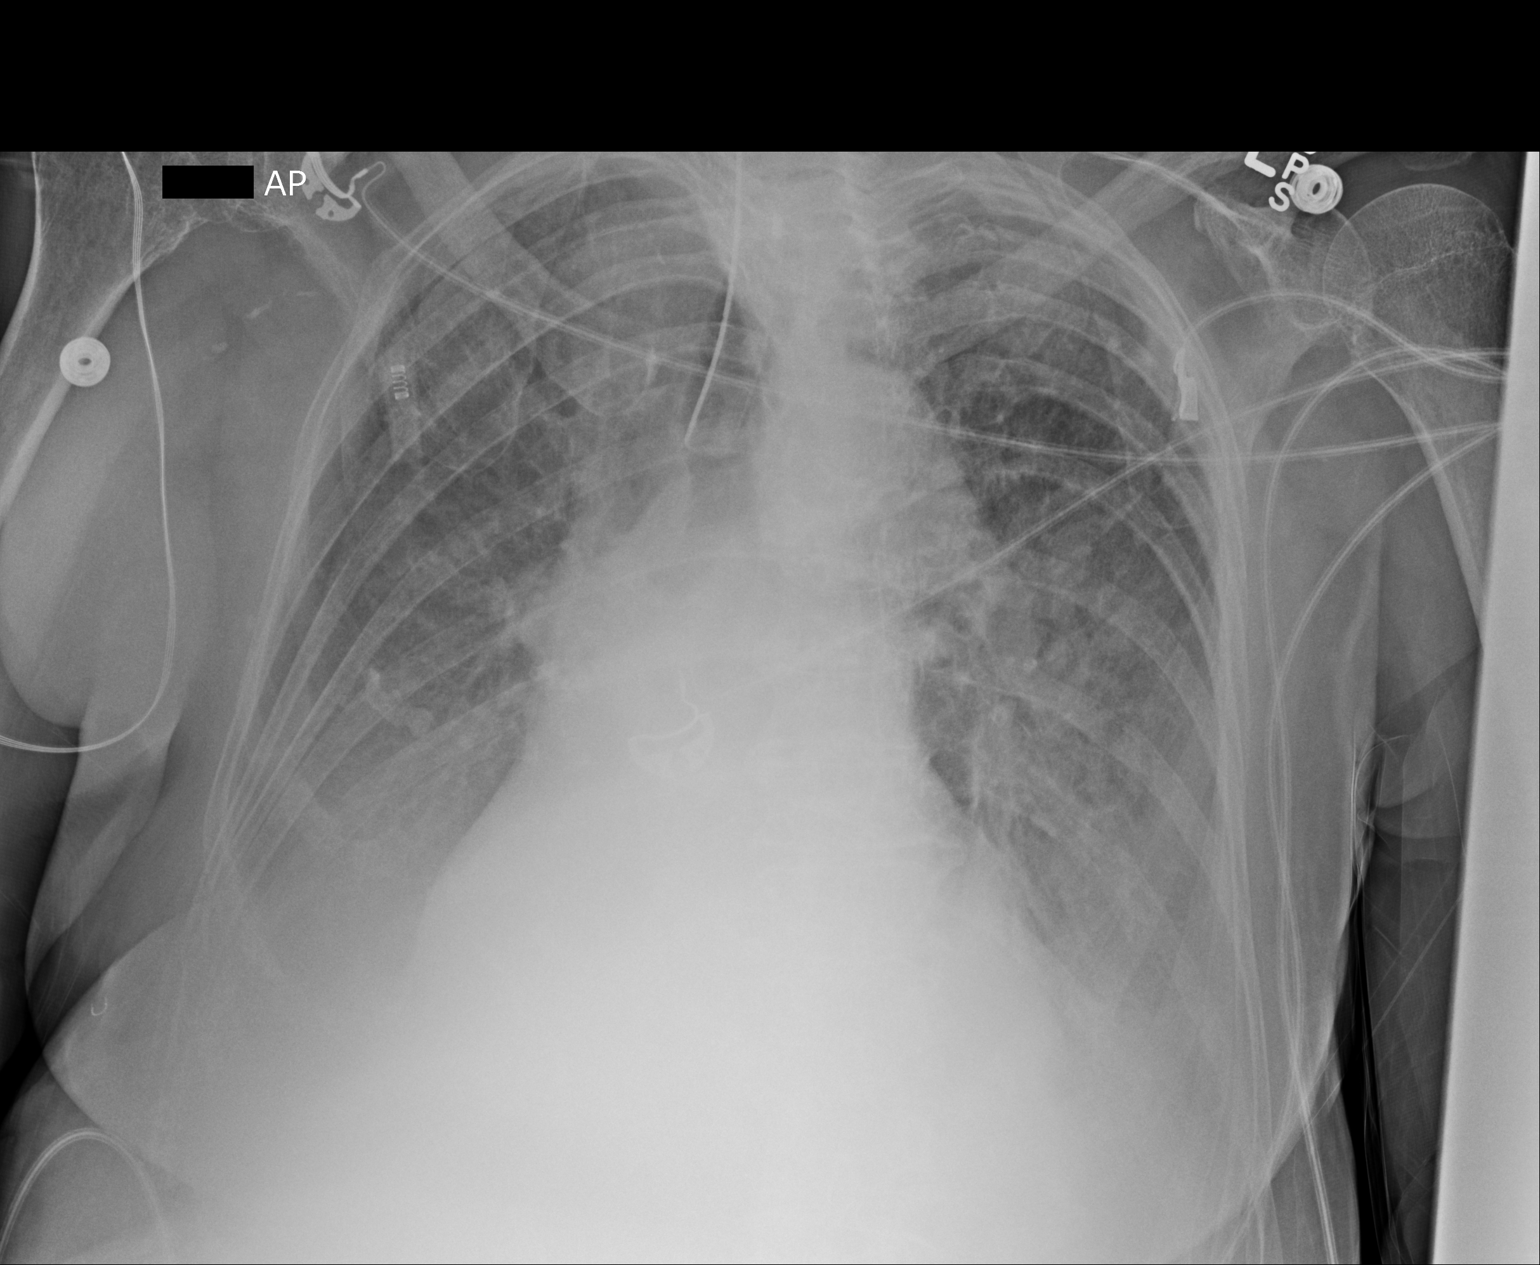

[1 of 1 positions shown; findings below may reference images not displayed]

FINDINGS: Endotracheal tube tip is 2.2 cm above the carina. No pneumothorax.
There are persistent pleural effusions on the right with
cardiomegaly. Pulmonary vascularity is within normal limits. There
is mild interstitial edema. No airspace consolidations seen. There
is atherosclerotic calcification in the aorta. No evident
adenopathy.
IMPRESSION: Endotracheal tube as described without apparent pneumothorax.
Persistent changes of congestive heart failure. No airspace
consolidation appreciable.
